# Patient Record
Sex: Female | Born: 1935 | Race: White | Hispanic: No | Smoking: Never smoker
Health system: Southern US, Community
[De-identification: ages and names within clinical notes are randomized; demographics above are authoritative.]

## PROBLEM LIST (undated history)

## (undated) DIAGNOSIS — F329 Major depressive disorder, single episode, unspecified: Secondary | ICD-10-CM

## (undated) DIAGNOSIS — K5790 Diverticulosis of intestine, part unspecified, without perforation or abscess without bleeding: Secondary | ICD-10-CM

## (undated) DIAGNOSIS — IMO0001 Reserved for inherently not codable concepts without codable children: Secondary | ICD-10-CM

## (undated) DIAGNOSIS — S62109A Fracture of unspecified carpal bone, unspecified wrist, initial encounter for closed fracture: Secondary | ICD-10-CM

## (undated) DIAGNOSIS — Z5189 Encounter for other specified aftercare: Secondary | ICD-10-CM

## (undated) DIAGNOSIS — R51 Headache: Secondary | ICD-10-CM

## (undated) DIAGNOSIS — K589 Irritable bowel syndrome without diarrhea: Secondary | ICD-10-CM

## (undated) DIAGNOSIS — I2510 Atherosclerotic heart disease of native coronary artery without angina pectoris: Secondary | ICD-10-CM

## (undated) DIAGNOSIS — K219 Gastro-esophageal reflux disease without esophagitis: Secondary | ICD-10-CM

## (undated) DIAGNOSIS — F32A Depression, unspecified: Secondary | ICD-10-CM

## (undated) DIAGNOSIS — R0602 Shortness of breath: Secondary | ICD-10-CM

## (undated) DIAGNOSIS — E785 Hyperlipidemia, unspecified: Secondary | ICD-10-CM

## (undated) DIAGNOSIS — M1710 Unilateral primary osteoarthritis, unspecified knee: Secondary | ICD-10-CM

## (undated) DIAGNOSIS — IMO0002 Reserved for concepts with insufficient information to code with codable children: Secondary | ICD-10-CM

## (undated) DIAGNOSIS — R296 Repeated falls: Secondary | ICD-10-CM

## (undated) DIAGNOSIS — D649 Anemia, unspecified: Secondary | ICD-10-CM

## (undated) DIAGNOSIS — F419 Anxiety disorder, unspecified: Secondary | ICD-10-CM

## (undated) DIAGNOSIS — I10 Essential (primary) hypertension: Secondary | ICD-10-CM

## (undated) DIAGNOSIS — I5181 Takotsubo syndrome: Secondary | ICD-10-CM

## (undated) HISTORY — PX: KNEE SURGERY: SHX244

## (undated) HISTORY — DX: Irritable bowel syndrome, unspecified: K58.9

## (undated) HISTORY — PX: BREAST RECONSTRUCTION: SHX9

## (undated) HISTORY — PX: IMPLANTABLE CONTACT LENS IMPLANTATION: SHX1792

## (undated) HISTORY — DX: Takotsubo syndrome: I51.81

## (undated) HISTORY — DX: Gastro-esophageal reflux disease without esophagitis: K21.9

## (undated) HISTORY — DX: Unilateral primary osteoarthritis, unspecified knee: M17.10

## (undated) HISTORY — PX: HYSTEROTOMY: SHX1776

## (undated) HISTORY — DX: Reserved for concepts with insufficient information to code with codable children: IMO0002

## (undated) HISTORY — PX: TONSILLECTOMY: SUR1361

## (undated) HISTORY — DX: Hyperlipidemia, unspecified: E78.5

## (undated) HISTORY — PX: CATARACT EXTRACTION W/ INTRAOCULAR LENS  IMPLANT, BILATERAL: SHX1307

## (undated) HISTORY — PX: BACK SURGERY: SHX140

## (undated) HISTORY — PX: FRACTURE SURGERY: SHX138

## (undated) HISTORY — DX: Diverticulosis of intestine, part unspecified, without perforation or abscess without bleeding: K57.90

## (undated) HISTORY — PX: JOINT REPLACEMENT: SHX530

---

## 1997-10-26 ENCOUNTER — Other Ambulatory Visit: Admission: RE | Admit: 1997-10-26 | Discharge: 1997-10-26 | Payer: Self-pay | Admitting: Obstetrics and Gynecology

## 1998-01-03 ENCOUNTER — Ambulatory Visit (HOSPITAL_COMMUNITY): Admission: RE | Admit: 1998-01-03 | Discharge: 1998-01-03 | Payer: Self-pay | Admitting: General Surgery

## 1998-05-16 ENCOUNTER — Ambulatory Visit (HOSPITAL_COMMUNITY): Admission: RE | Admit: 1998-05-16 | Discharge: 1998-05-16 | Payer: Self-pay | Admitting: Gastroenterology

## 2000-02-15 ENCOUNTER — Encounter: Payer: Self-pay | Admitting: Obstetrics and Gynecology

## 2000-02-15 ENCOUNTER — Encounter: Admission: RE | Admit: 2000-02-15 | Discharge: 2000-02-15 | Payer: Self-pay | Admitting: Obstetrics and Gynecology

## 2000-09-18 ENCOUNTER — Other Ambulatory Visit: Admission: RE | Admit: 2000-09-18 | Discharge: 2000-09-18 | Payer: Self-pay | Admitting: Obstetrics and Gynecology

## 2001-02-24 ENCOUNTER — Encounter: Admission: RE | Admit: 2001-02-24 | Discharge: 2001-02-24 | Payer: Self-pay | Admitting: Obstetrics and Gynecology

## 2001-02-24 ENCOUNTER — Encounter: Payer: Self-pay | Admitting: Obstetrics and Gynecology

## 2001-11-04 ENCOUNTER — Other Ambulatory Visit: Admission: RE | Admit: 2001-11-04 | Discharge: 2001-11-04 | Payer: Self-pay | Admitting: Internal Medicine

## 2002-06-30 ENCOUNTER — Encounter: Admission: RE | Admit: 2002-06-30 | Discharge: 2002-06-30 | Payer: Self-pay | Admitting: Gastroenterology

## 2002-06-30 ENCOUNTER — Encounter: Payer: Self-pay | Admitting: Gastroenterology

## 2002-07-13 ENCOUNTER — Encounter: Payer: Self-pay | Admitting: Obstetrics and Gynecology

## 2002-07-13 ENCOUNTER — Encounter: Admission: RE | Admit: 2002-07-13 | Discharge: 2002-07-13 | Payer: Self-pay | Admitting: Gastroenterology

## 2002-07-13 ENCOUNTER — Encounter: Payer: Self-pay | Admitting: Gastroenterology

## 2002-07-17 ENCOUNTER — Encounter: Admission: RE | Admit: 2002-07-17 | Discharge: 2002-07-17 | Payer: Self-pay | Admitting: Obstetrics and Gynecology

## 2002-07-17 ENCOUNTER — Encounter: Payer: Self-pay | Admitting: Obstetrics and Gynecology

## 2002-08-05 ENCOUNTER — Encounter: Payer: Self-pay | Admitting: Gastroenterology

## 2002-08-05 ENCOUNTER — Encounter: Admission: RE | Admit: 2002-08-05 | Discharge: 2002-08-05 | Payer: Self-pay | Admitting: Gastroenterology

## 2002-12-11 ENCOUNTER — Ambulatory Visit (HOSPITAL_COMMUNITY): Admission: RE | Admit: 2002-12-11 | Discharge: 2002-12-11 | Payer: Self-pay | Admitting: Family Medicine

## 2003-08-16 ENCOUNTER — Inpatient Hospital Stay (HOSPITAL_COMMUNITY): Admission: RE | Admit: 2003-08-16 | Discharge: 2003-08-25 | Payer: Self-pay | Admitting: Orthopedic Surgery

## 2003-09-13 ENCOUNTER — Encounter: Admission: RE | Admit: 2003-09-13 | Discharge: 2003-12-12 | Payer: Self-pay | Admitting: Orthopedic Surgery

## 2005-05-31 ENCOUNTER — Encounter: Admission: RE | Admit: 2005-05-31 | Discharge: 2005-05-31 | Payer: Self-pay | Admitting: Obstetrics and Gynecology

## 2005-08-07 ENCOUNTER — Ambulatory Visit (HOSPITAL_COMMUNITY): Admission: RE | Admit: 2005-08-07 | Discharge: 2005-08-07 | Payer: Self-pay | Admitting: Ophthalmology

## 2006-03-13 ENCOUNTER — Ambulatory Visit: Payer: Self-pay | Admitting: *Deleted

## 2006-03-13 ENCOUNTER — Inpatient Hospital Stay (HOSPITAL_COMMUNITY): Admission: EM | Admit: 2006-03-13 | Discharge: 2006-03-16 | Payer: Self-pay | Admitting: Emergency Medicine

## 2006-03-14 ENCOUNTER — Encounter: Payer: Self-pay | Admitting: Cardiology

## 2006-04-08 ENCOUNTER — Ambulatory Visit: Payer: Self-pay

## 2006-04-08 ENCOUNTER — Encounter: Payer: Self-pay | Admitting: Cardiology

## 2006-04-16 ENCOUNTER — Ambulatory Visit: Payer: Self-pay | Admitting: Cardiovascular Disease

## 2006-06-18 ENCOUNTER — Encounter: Admission: RE | Admit: 2006-06-18 | Discharge: 2006-06-18 | Payer: Self-pay | Admitting: Obstetrics and Gynecology

## 2007-06-20 ENCOUNTER — Encounter: Admission: RE | Admit: 2007-06-20 | Discharge: 2007-06-20 | Payer: Self-pay | Admitting: Obstetrics and Gynecology

## 2007-06-27 ENCOUNTER — Ambulatory Visit: Payer: Self-pay | Admitting: Cardiovascular Disease

## 2008-06-07 ENCOUNTER — Ambulatory Visit: Payer: Self-pay | Admitting: Cardiovascular Disease

## 2008-06-22 ENCOUNTER — Encounter: Admission: RE | Admit: 2008-06-22 | Discharge: 2008-06-22 | Payer: Self-pay | Admitting: Obstetrics and Gynecology

## 2008-07-21 ENCOUNTER — Encounter: Payer: Self-pay | Admitting: Cardiovascular Disease

## 2008-07-21 ENCOUNTER — Ambulatory Visit: Payer: Self-pay

## 2009-03-22 ENCOUNTER — Emergency Department (HOSPITAL_COMMUNITY): Admission: EM | Admit: 2009-03-22 | Discharge: 2009-03-22 | Payer: Self-pay | Admitting: Emergency Medicine

## 2009-05-25 DIAGNOSIS — K5730 Diverticulosis of large intestine without perforation or abscess without bleeding: Secondary | ICD-10-CM | POA: Insufficient documentation

## 2009-05-25 DIAGNOSIS — Z8719 Personal history of other diseases of the digestive system: Secondary | ICD-10-CM

## 2009-05-25 DIAGNOSIS — I5181 Takotsubo syndrome: Secondary | ICD-10-CM | POA: Insufficient documentation

## 2009-05-25 DIAGNOSIS — K219 Gastro-esophageal reflux disease without esophagitis: Secondary | ICD-10-CM

## 2009-05-25 DIAGNOSIS — M1710 Unilateral primary osteoarthritis, unspecified knee: Secondary | ICD-10-CM

## 2009-05-25 DIAGNOSIS — E785 Hyperlipidemia, unspecified: Secondary | ICD-10-CM

## 2009-10-04 ENCOUNTER — Ambulatory Visit: Payer: Self-pay | Admitting: Cardiovascular Disease

## 2010-03-14 ENCOUNTER — Encounter: Admission: RE | Admit: 2010-03-14 | Discharge: 2010-03-14 | Payer: Self-pay | Admitting: Obstetrics and Gynecology

## 2010-07-03 ENCOUNTER — Telehealth: Payer: Self-pay | Admitting: Cardiovascular Disease

## 2010-07-03 ENCOUNTER — Ambulatory Visit: Payer: Self-pay

## 2010-08-24 NOTE — Assessment & Plan Note (Signed)
Summary: f1y    Visit Type:  1 year follow up Primary Provider:  Benedetto Goad, M.D.  CC:  Chest pains relief with nitrog- Tiredness.  History of Present Illness: 76 year-old woman with history of Tako-Tsubo cardiomyopathy, presenting today for follow-up evaluation. She presented with her initial clinical event in 2007. Her LV function recovered fully, with LVEF 55-60% in 2009.   She reports extreme stress over the past year related to caring for her demented sister and difficult family dynamics associated with that.  She has had episodic chest pains and 'heart racing' at times of extreme stress, relieved with NTG. Denies dyspnea, orthopnea, PND, or edema.  Current Medications (verified): 1)  Lexapro 20 Mg Tabs (Escitalopram Oxalate) .... Take 1 Tablet By Mouth Two Times A Day 2)  Aspirin 81 Mg Tbec (Aspirin) .... Take One Tablet By Mouth Daily 3)  Oscal 500/200 D-3 500-200 Mg-Unit Tabs (Calcium-Vitamin D) .... Take 1 Tablet By Mouth Once A Day 4)  Fish Oil 1000 Mg Caps (Omega-3 Fatty Acids) .... Twice A Week 5)  Coreg 6.25 Mg Tabs (Carvedilol) .... Take 1 Tablet By Mouth Two Times A Day 6)  Omeprazole 20 Mg Tbec (Omeprazole) .... Take 1 Tablet By Mouth Once A Day 7)  Alprazolam 0.5 Mg Tabs (Alprazolam) .... As Needed 8)  Nitrostat 0.4 Mg Subl (Nitroglycerin) .Marland Kitchen.. 1 Tablet Under Tongue At Onset of Chest Pain; You May Repeat Every 5 Minutes For Up To 3 Doses. 9)  Amoxicillin 500 Mg Caps (Amoxicillin) .... Prior Dentist Appointment  Allergies: 1)  ! Septra  Past History:  Past medical history reviewed for relevance to current acute and chronic problems.  Past Medical History: Reviewed history from 05/25/2009 and no changes required. 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 HEENT: normal Neck: normal carotid upstrokes without bruits, JVP normal Lungs: CTA CV: RRR without murmur or gallop Abd: soft, NT, positive BS, no bruit, no organomegaly Ext: no clubbing, cyanosis, or edema. 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 She had nonobstructive CAD by cardiac catheterization. Echo from about one year ago was back to normal with LVEF 60%. Normal EKG is reassuring as well. I would continue her current medical therapy without changes. We had a long talk about stress reduction, daily walks, and taking some time for herself.  Problem # 2:  HYPERLIPIDEMIA (ICD-272.4) followed by PCP.  Patient Instructions: 1)  Your physician recommends that you continue on your current medications as directed. Please refer to the Current Medication list given  to you today. 2)  Your physician wants you to follow-up in:   1 YEAR. You will receive a reminder letter in the mail two months in advance. If you don't receive a letter, please call our office to schedule the follow-up appointment.

## 2010-08-24 NOTE — Progress Notes (Signed)
Summary: heart rushing with sob off and on  Phone Note Call from Patient Call back at [facility] Phone [phone]   Caller: Patient Summary of Call: Pt heart rushing ,sob Initial call taken by: Judie Grieve,  July 03, 2010 11:38 AM  Follow-up for Phone Call        I spoke with the pt and she c/o SOB, diaphoresis and her heart racing with activity.  The pt said she has had to hire someone to finish her decorations and clean the house.  These symptoms have been occuring for a couple of months and do not occur on a daily basis.   The pt has had some pain in her chest but she cannot tell if it is heart related or gas.  The pt does take NTG for these symptoms and it has helped on some occasions.  I have arranged an appt with Tereso Newcomer PA-C today at 2:30.    Follow-up by: Julieta Gutting, RN, BSN,  July 03, 2010 11:58 AM

## 2010-09-30 ENCOUNTER — Encounter: Payer: Self-pay | Admitting: Cardiovascular Disease

## 2010-10-16 ENCOUNTER — Ambulatory Visit (INDEPENDENT_AMBULATORY_CARE_PROVIDER_SITE_OTHER): Payer: Medicare Other | Admitting: Cardiovascular Disease

## 2010-10-16 ENCOUNTER — Encounter: Payer: Self-pay | Admitting: Cardiovascular Disease

## 2010-10-16 VITALS — BP 120/68 | HR 55 | Ht 63.0 in | Wt 124.0 lb

## 2010-10-16 DIAGNOSIS — I5181 Takotsubo syndrome: Secondary | ICD-10-CM

## 2010-10-16 DIAGNOSIS — R072 Precordial pain: Secondary | ICD-10-CM

## 2010-10-16 DIAGNOSIS — I2510 Atherosclerotic heart disease of native coronary artery without angina pectoris: Secondary | ICD-10-CM

## 2010-10-16 NOTE — Assessment & Plan Note (Addendum)
The patient has underlying nonobstructive coronary disease from a cardiac catheterization several years ago. She reports increasing chest pain and shortness of breath. Recommend evaluation with a Lexiscan Myoview stress test.  If her stress test is normal, I would suspect her symptoms are related to stress and anxiety. She will continue her current medical program which includes a beta blocker and aspirin.

## 2010-10-16 NOTE — Progress Notes (Signed)
HPI:  76 year-old woman presenting for followup evaluation of Takotsubo's cardiomyopathy. She continues to have episodic chest pain and complains of problems with stress.  The patient has been under a great deal of stress with family illness lately. She has had several family members diagnosed with cancer. She has become increasingly concerned that she may have cancer as well because of secondhand smoke exposure. She has upcoming followup with her primary care physician.  The patient reports chest pressure with emotional stress. She denies exertional chest pain or pressure. She also complains of dyspnea with exertion. She denies edema, lightheadedness, palpitations, or syncope.    Outpatient Encounter Prescriptions as of 10/16/2010  Medication Sig Dispense Refill  . ALPRAZolam (XANAX) 0.5 MG tablet Take 0.5 mg by mouth at bedtime as needed.        Marland Kitchen amoxicillin (AMOXIL) 500 MG tablet Prn dental       . aspirin 81 MG tablet Take 81 mg by mouth daily.        . calcium-vitamin D (OSCAL WITH D 500-200) 500-200 MG-UNIT per tablet Take 1 tablet by mouth daily.        . carvedilol (COREG) 6.25 MG tablet Take 6.25 mg by mouth 2 (two) times daily with a meal.        . escitalopram (LEXAPRO) 20 MG tablet Take 20 mg by mouth 2 (two) times daily.        . nitroGLYCERIN (NITROSTAT) 0.4 MG SL tablet Place 0.4 mg under the tongue every 5 (five) minutes as needed.        . Omega-3 Fatty Acids (FISH OIL CONCENTRATE PO) Twice a week       . omeprazole (PRILOSEC) 20 MG capsule Take 20 mg by mouth daily.          Allergies  Allergen Reactions  . Sulfamethoxazole W/Trimethoprim     Past Medical History  Diagnosis Date  . Takotsubo syndrome   . Other and unspecified hyperlipidemia   . Esophageal reflux   . Osteoarthrosis, unspecified whether generalized or localized, lower leg   . IBS (irritable bowel syndrome)   . Diverticular disease     ROS:  Positive for gait instability and poor balance, otherwise  negative except as per HPI  BP 120/68  Pulse 55  Ht 5\' 3"  (1.6 m)  Wt 124 lb (56.246 kg)  BMI 21.97 kg/m2  PHYSICAL EXAM: Pt is alert and oriented, elderly woman in NAD HEENT: normal Neck: JVP - normal, carotids 2+= without bruits Lungs: CTA bilaterally CV: bradycardic and regular without murmur or gallop Abd: soft, NT, Positive BS, no hepatomegaly Ext: no C/C/E, distal pulses intact and equal Skin: warm/dry no rash  EKG:  Sinus bradycardia 55 bpm, otherwise within normal limits.  ASSESSMENT AND PLAN:

## 2010-10-16 NOTE — Patient Instructions (Signed)
Your physician has requested that you have a lexiscan myoview. For further information please visit [email or web address]. Please follow instruction sheet, as given.  Your physician recommends that you continue on your current medications as directed. Please refer to the Current Medication list given to you today.  Your physician wants you to follow-up in: 1 YEAR.  You will receive a reminder letter in the mail two months in advance. If you don't receive a letter, please call our office to schedule the follow-up appointment.   

## 2010-10-24 ENCOUNTER — Ambulatory Visit (HOSPITAL_COMMUNITY): Payer: Medicare Other | Attending: Cardiovascular Disease | Admitting: Radiology

## 2010-10-24 DIAGNOSIS — R0609 Other forms of dyspnea: Secondary | ICD-10-CM

## 2010-10-24 DIAGNOSIS — R079 Chest pain, unspecified: Secondary | ICD-10-CM

## 2010-10-24 DIAGNOSIS — I2510 Atherosclerotic heart disease of native coronary artery without angina pectoris: Secondary | ICD-10-CM

## 2010-10-24 MED ORDER — TECHNETIUM TC 99M TETROFOSMIN IV KIT
11.0000 | PACK | Freq: Once | INTRAVENOUS | Status: AC | PRN
  Administered 2010-10-24: 11 via INTRAVENOUS

## 2010-10-24 MED ORDER — TECHNETIUM TC 99M TETROFOSMIN IV KIT
33.0000 | PACK | Freq: Once | INTRAVENOUS | Status: AC | PRN
  Administered 2010-10-24: 33 via INTRAVENOUS

## 2010-10-24 MED ORDER — REGADENOSON 0.4 MG/5ML IV SOLN
0.4000 mg | Freq: Once | INTRAVENOUS | Status: AC
  Administered 2010-10-24: 0.4 mg via INTRAVENOUS

## 2010-10-24 NOTE — Progress Notes (Signed)
[facility] SITE 3 NUCLEAR MED [address] [place] [address] [phone]  Cardiology Nuclear Med Study  [name] is a 76 y.o. female [id number] Jan 19, 1935   Nuclear Med Background Indication for Stress Test:  Evaluation for Ischemia History: '07 Heart Catheterization: N/O CAD;'09 Echo: EF=60%;Takotsubo's cardiomyopathy Cardiac Risk Factors: Family History - CAD and Lipids  Symptoms:  Chest Pain with Exertion (last date of chest discomfort last yesterday), Dizziness, DOE, Fatigue, Fatigue with Exertion, Light-Headedness, Near Syncope, Palpitations, Rapid HR and SOB   Nuclear Pre-Procedure Caffeine/Decaff Intake:  None NPO After: 10:00pm   Lungs:  Clear IV 0.9% NS with Angio Cath:  20g  IV Site: R Antecubital  IV Started by:  Stanton Kidney, EMT-P  Chest Size (in):  36 Cup Size: C  Height: 5\' 3"  (1.6 m)  Weight:  121 lb (54.885 kg)  BMI:  Body mass index is 21.43 kg/(m^2). Tech Comments:  Beta blocker held this am, per patient.    Nuclear Med Study 1 or 2 day study: 1 day  Stress Test Type:  Lexiscan  Reading MD: Cassell Clement, MD  Order Authorizing Provider:  Dr. Tonny Bollman, MD  Resting Radionuclide: Technetium 2m Tetrofosmin  Resting Radionuclide Dose: 11 mCi   Stress Radionuclide:  Technetium 64m Tetrofosmin  Stress Radionuclide Dose: 33 mCi           Stress Protocol Rest HR: 57 Stress HR: 87  Rest BP: 124/75 Stress BP: 140/79  Exercise Time (min): n/a METS: n/a   Predicted Max HR: 147 bpm % Max HR: 59.18 bpm Rate Pressure Product: 21308   Dose of Adenosine (mg):  n/a Dose of Lexiscan: 0.4 mg  Dose of Atropine (mg): n/a Dose of Dobutamine: n/a mcg/kg/min (at max HR)  Stress Test Technologist: Irean Hong, RN  Nuclear Technologist:  Domenic Polite, CNMT     Rest Procedure:  Myocardial perfusion imaging was performed at rest 45 minutes following the intravenous administration of Technetium 46m Tetrofosmin. Rest ECG: NSR with  early replorization  Stress Procedure:  The patient received IV Lexiscan 0.4 mg over 15-seconds.  Technetium 31m Tetrofosmin injected at 30-seconds.  There were nonspecific T wave changes with Lexiscan. The patient complained of chest tightness with Lexiscan.  Quantitative spect images were obtained after a 45 minute delay. Stress ECG: No significant change from baseline ECG  QPS Raw Data Images:  Normal; no motion artifact; normal heart/lung ratio. Stress Images:  Normal homogeneous uptake in all areas of the myocardium. Rest Images:  Normal homogeneous uptake in all areas of the myocardium. Subtraction (SDS):  No evidence of ischemia. Transient Ischemic Dilatation (Normal <1.22):  0.88 Lung/Heart Ratio (Normal <0.45):  0.31  Quantitative Gated Spect Images QGS EDV:  43 ml QGS ESV:  8 ml QGS cine images:  NL LV Function; NL Wall Motion QGS EF: 82%  Impression Exercise Capacity:  Lexiscan with no exercise. BP Response:  Normal blood pressure response. Clinical Symptoms:  No chest pain. ECG Impression:  No significant ST segment change suggestive of ischemia. Comparison with Prior Nuclear Study: No previous nuclear study performed  Overall Impression:  Normal stress nuclear study. and With stress there is no chest pain and no significant EKG change.    Cassell Clement

## 2010-10-25 NOTE — Progress Notes (Signed)
Normal nuclear study noted. 

## 2010-10-25 NOTE — Progress Notes (Signed)
ROUTED TO DR. COOPER.Falecha Clark ° °

## 2010-10-26 ENCOUNTER — Telehealth: Payer: Self-pay | Admitting: Cardiovascular Disease

## 2010-10-26 NOTE — Telephone Encounter (Signed)
I spoke with the pt and she requested a copy of myoview be mailed to her home. Myoview placed in the out going mail.

## 2010-10-26 NOTE — Progress Notes (Signed)
Pt aware of results by phone.  

## 2010-10-26 NOTE — Telephone Encounter (Signed)
Pt states she talk to a nurse re test results. Pt states she does not remember the results. Pt would like the results mail to her.

## 2010-12-05 NOTE — Assessment & Plan Note (Signed)
[facility]                            CARDIOLOGY OFFICE NOTE   CAMILLE, DRAGAN                         MRN:          [id number]  DATE:06/07/2008                            DOB:          02/18/1935    [name] was seen in followup at the [facility] Cardiology Office,  June 07, 2008.  She is a delightful 76 year old woman with a history  of Tako-tsubo cardiomyopathy.  She had typical findings of apical  ballooning and severe LV dysfunction at presentation with an EF of 35-  40%.  Her posthospitalization echo showed normalization of LV function  with an EF of 60%.  From a symptomatic standpoint, she has had  intermittent chest pains and exertional dyspnea for several months.  She  relates her chest pain due to stress level.  She also has dyspnea with  moderate level of activity.  She described an incident where her cat got  outside and she had to chase the cat down.  She had extreme dyspnea with  this activity and fell very poorly thereafter.  She denies orthopnea,  PND, edema, or palpitations.   MEDICATIONS:  1. Lexapro 20 mg twice daily.  2. Dicyclomine 10 mg 4 daily.  3. Aspirin 81 mg daily.  4. Multivitamin 1 daily.  5. Os-Cal plus D 600 mg daily.  6. Beta-carotene 1 daily.  7. Omega-3 fish oil twice weekly.  8. Coreg 6.25 mg twice daily.  9. Omeprazole 20 mg daily.   ALLERGIES:  SEPTRA.   PHYSICAL EXAMINATION:  GENERAL:  The patient is alert and oriented.  No  acute distress.  VITAL SIGNS:  Weight is 128 pounds, blood pressure 112/70, heart rate  68, respiratory rate 12.  HEENT:  Normal.  NECK:  Normal carotid upstrokes.  No bruits.  JVP normal.  LUNGS:  Clear bilaterally.  HEART:  Regular rate and rhythm.  No murmurs or gallops.  ABDOMEN:  Soft, nontender.  No organomegaly.  EXTREMITIES:  No clubbing, cyanosis, or edema.   EKG shows normal sinus rhythm and is within normal limits.   ASSESSMENT:  Tako-tsubo cardiomyopathy.   Continue Coreg.  Repeat 2-D  echocardiogram to reassess LV function in the setting of her dyspnea.  I  suspect her chest pain is stress related.  No other changes were made in  her medical regimen today.  I spoke with her at length about  starting an exercise program to help her deal with all of her stress.  I  will see her back in 1 year for followup and will review her echo after  it is completed.     Veverly Fells. Excell Seltzer, MD  Electronically Signed    MDC/MedQ  DD: 06/07/2008  DT: 06/08/2008  Job #: [id number]

## 2010-12-05 NOTE — Assessment & Plan Note (Signed)
[facility]                            CARDIOLOGY OFFICE NOTE   WYONA, NEILS                         MRN:          [id number]  DATE:06/27/2007                            DOB:          11-19-1934    [name] was seen in followup at the [facility] on June 27, 2007.  [name] is a delightful 76 year old woman with a history of  July 02, 2007 Tako-Tsubo cardiomyopathy.  She presented with typical  to the hospital in August 2007, with symptoms typical of an acute  coronary syndrome and had positive cardiac biomarkers.  She underwent  catheterization that demonstrated nonobstructive coronary artery disease  and typical findings of Tako-Tsubo cardiomyopathy.  She has been treated  with medical therapy and has done well.  Her post hospital  echocardiogram demonstrated normalization of her left ventricular  function with an EF of 60%.   From a symptomatic standpoint [name] has done relatively well.  She  describes occasional left sided chest pain that occur with stress.  She  has been through a lot of family stress with the death of a family  member recently.  She denies exertional chest discomfort.  She has no  orthopnea, PND, palpitations, or edema.   CURRENT MEDICATIONS:  1. Carvedilol 3.125 mg twice daily.  2. Lexapro 20 mg twice daily.  3. Dicyclomine 10 mg four daily.  4. Aspirin 81 mg daily.  5. Multivitamin daily.  6. Os-Cal plus D 600 mg daily.  7. Betacarotene daily.  8. Omega-3 fish oil twice weekly.   ALLERGIES:  SEPTRA.   PHYSICAL EXAMINATION:  GENERAL: The patient is alert and oriented.  She  is in no acute distress.  VITAL SIGNS:  Weight is 131, blood pressure 106/80, heart rate 70,  respiratory rate 16.  HEENT:  Normal.  NECK:  Normal carotid upstrokes without bruits.  Jugular venous pressure  is normal.  LUNGS:  Clear to auscultation bilaterally.  HEART:  Regular rate and rhythm without murmurs or  gallops.  ABDOMEN:  Soft, nontender.  No organomegaly.  EXTREMITIES:  No clubbing, cyanosis, or edema.   EKG shows normal sinus rhythm and is within normal limits.  Heart rate  is 70 beats per minute.   ASSESSMENT:  [name] is stable from a cardiac standpoint.  I think  with her recurrent chest pain, it would be reasonable to increase her  beta-blocker dose.  I have asked her to double her Coreg to 6.25 mg  twice daily.  The patients with Tako-Tsubo cardiomyopathy often times  have recurrence of similar chest pains.  The best way to manage this  will be to continue to titrate her beta-blocker accordingly.  Otherwise,  [name] is doing quite well.  I have asked her to remain on low dose  aspirin for her nonobstructive coronary artery disease.     Veverly Fells. Excell Seltzer, MD  Electronically Signed    MDC/MedQ  DD: 07/02/2007  DT: 07/02/2007  Job #: [id number]   cc:   Gloriajean Dell. Andrey Campanile, M.D.

## 2010-12-08 NOTE — Consult Note (Signed)
NAME:  [name], [name]                            ACCOUNT NO.:  [id number]   MEDICAL RECORD NO.:  [id number]                   PATIENT TYPE:  INP   LOCATION:  5024                                 FACILITY:  [facility]   PHYSICIAN:  Corinna L. Lendell Caprice, MD             DATE OF BIRTH:  Feb 18, 1935   DATE OF CONSULTATION:  08/19/2003  DATE OF DISCHARGE:                                   CONSULTATION   CONSULTING PHYSICIAN:  Dr. Carlisle Beers. Rendall.   REASON FOR CONSULTATION:  Right upper lobe infiltrate.   IMPRESSIONS/RECOMMENDATIONS:  1. Right upper lobe pneumonia, possibly aspiration related, given her recent     general anesthesia and nausea.  I will give Avelox and add clindamycin     for good anaerobic coverage.  She will need a repeat chest x-ray     eventually and once she is able to tolerate diet better, can be switched     to oral antibiotics.  2. Hypoxia secondary to #1.  Agree with supplemental oxygen.  Currently, her     oxygen saturations are good on 2 L nasal cannula.  3. Confusion, possibly secondary to pneumonia and hypoxia versus medication     related.  4. Status post right total knee arthroplasty.  The patient is on deep venous     thrombosis prophylaxis.  5. Gastroesophageal reflux disease.  I will add Protonix, given all of her     nausea and as she is on low-molecular-weight heparin for ulcer     prophylaxis; also, it may help with her nausea.  6. Irritable bowel syndrome.   HISTORY OF PRESENT ILLNESS:  [name] is a 76 year old white female who  underwent a right total knee arthroplasty on the 24th of January.  Since  then, she has apparently been having a lot of problems with pain and nausea.  She had a negative PVL of the legs because she had some calf pain.  Currently, she is confused and therefore, most of the history is per old  records and per the nurses.  I was called by the physician's assistant  tonight at 8 o'clock to consult for a right upper lobe pneumonia  and  hypoxia.  Her PO2 on room air was only 48.6 and currently, her oxygen  saturation is good on 2 L nasal cannula.   PAST MEDICAL HISTORY:  Past medical history as above.   MEDICATIONS:  1. Celexa 40 mg p.o. daily.  2. Colace.  3. Senokot.  4. Bentyl 10 mg p.o. 4 times daily.  5. Arixtra 2.5 mg per 24 hours.  6. She is getting p.r.n. Percocet and Reglan, p.r.n. Skelaxin, p.r.n.     Restoril, p.r.n. Phenergan.   SOCIAL HISTORY:  The patient does not drink or smoke.   FAMILY HISTORY/REVIEW OF SYSTEMS:  Family history/review of systems unable  to obtain as the patient is rather confused.   PHYSICAL  EXAMINATION:  VITAL SIGNS:  On physical examination, her  temperature is 99.3; her maximum temperature over the past 48 hours was  100.6.  Her pulse is 93, respiratory rate 18, blood pressure 101/46.  She is  93% on 2 L nasal cannula.  GENERAL:  In general, the patient is asleep but arousable.  She is oriented  to place and time but the answers to my questions are nonsensical and she  quickly falls back asleep.  HEENT:  Normocephalic, atraumatic.  Pupils equal, round and reactive to  light.  Moist mucous membranes.  Oropharynx is clear.  NECK:  Neck is supple.  No lymphadenopathy.  No carotid bruits or  thyromegaly.  No jugular venous distention.  LUNGS:  Lungs are clear to auscultation but poor inspiratory effort, no  wheezes, rhonchi or rales.  CARDIOVASCULAR:  Regular rate and rhythm without murmurs, gallops or rubs.  ABDOMEN:  Normal bowel sounds, soft, nontender and nondistended.  GU AND RECTAL:  Deferred.  EXTREMITIES:  She has sequential compression devices and TED hose.  No  clubbing, cyanosis, or edema.  No calf tenderness.   LABORATORIES:  Her white blood cell count today was 15, up from 13 yesterday  and 10 on the 25th; her hemoglobin is 9.3, hematocrit is 26.8, platelet  count 178,000.  ABG on room air:  Her pH was 7.506, PCO2 37, PO2 48.   Chest x-ray -- wet reading  called to the nurse -- shows right upper lobe  infiltrate and bibasilar atelectasis versus infiltrate.   COMMENT:  I would like to thank Dr. Priscille Kluver for this consult and I will  continue to follow as needed.                                               Corinna L. Lendell Caprice, MD    CLS/MEDQ  D:  08/19/2003  T:  08/21/2003  Job:  [id number]

## 2010-12-08 NOTE — Discharge Summary (Signed)
[name], [name]                  ACCOUNT NO.:  [id number]   MEDICAL RECORD NO.:  [id number]          PATIENT TYPE:  INP   LOCATION:  2916                         FACILITY:  [facility]   PHYSICIAN:  Gene Serpe, PA-C       DATE OF BIRTH:  1935-07-06   DATE OF ADMISSION:  03/13/2006  DATE OF DISCHARGE:  03/16/2006                                 DISCHARGE SUMMARY   PRIMARY CARDIOLOGIST:  Dr. Tonny Bollman (new)   PRINCIPAL DIAGNOSES:  1. Status post hypokalemia.  2. Takotsubo cardiomyopathy.      a.     Non ST elevation myocardial infarction (peak troponin I 0.7).      b.     Moderate left anterior descending disease with intramyocardial       bridge.      c.     Normal circumflex/right coronary arteries.      d.     EF 35-40% by echocardiography.  3. Status post hypotension.  4. Hyperlipidemia.  5. Question viral upper respiratory infection.  6. Abnormal chest x-ray.      a.     Negative followup chest CT scan.   PROCEDURES:  Emergent coronary angiography, August 22, by Dr. Tonny Bollman  (see Cath report for full details).   REASON FOR ADMISSION:  [name] is a 76 year old female, with no known  cardiac history, who presented to the Emergency Room with worsening  epigastric discomfort associated with dyspnea.  She was found to have  abnormal cardiac markers, as well as new T-wave inversions and  inferior/anterior ST elevation on electrocardiogram.  She was, thus, taken  directly to the Catheterization Lab for emergent coronary angiography.   HOSPITAL COURSE:  Coronary angiography was performed by Dr. Tonny Bollman  (see Cath report for full details), revealing moderate, mid left anterior  descending artery disease with intramyocardial bridging, otherwise normal  circumflex and right coronary arteries.  LV-gram revealed severe segmental  LV dysfunction with apical ballooning pattern.   Followup echocardiography suggested and ejection fraction of 35-40% with  apical  dyskinesia.  A repeat echocardiogram in six to eight week is  recommended.   Dr. Excell Seltzer concluded that this is suggestive of Takotsubo cardiomyopathy and  recommended continued treatment with Coreg.  ACE inhibitor was discontinued  secondary to relative hypotension.   Patient was cleared for discharge on hospital day three, in hemodynamically  stable condition.  Her blood pressure had improved to the 120 systolic  range.   At time of discharge, we discussed prior treatment with cholesterol-lowering  agents.  LDL this admission is 136.  The patient, however, is reluctant to  take any additional medications, but agreed to discuss this with her  physician at time of scheduled followup.   LABORATORY DATA:  Negative total CPK with elevated MB of 7.7; peak troponin  I 0.73 on admission.  Lipid profile:  Total cholesterol 170, triglyceride  69, HDL 20 and LDL 136.  Sodium 130, potassium 3.1, BUN 3, creatinine 0.8 on  admission.  Decreased albumin 2.8, otherwise normal liver enzymes.  WBC 10,  hemoglobin 10.8, hematocrit 31.7 (MCV 92), platelets 259 on admission, INR  1.4 on admission.   Admission Chest X-Ray:  Right lung base rounded opacity.  Followup Chest CT  Scan:  Prominent epicardial fat pad; no mass/acute abnormality.   DISCHARGE MEDICATIONS:  1. Coreg 3.125 mg b.i.d.  2. Aspirin 325 mg daily.  3. Nitrostat 0.4 mg as directed.  4. Xanax 0.25 mg q.h.s.  5. Lexapro 20 mg daily.  6. Bentyl 10 mg q.i.d.   FOLLOWUP:  1. Dr. Tonny Bollman on September 25 at 9:30 a.m.  2. Follow up 2D echocardiogram scheduled for September 17 at 11:30 a.m.   DISPOSITION:  Stable.           ______________________________  Rozell Searing, PA-C     GS/MEDQ  D:  03/16/2006  T:  03/16/2006  Job:  [id number]

## 2010-12-08 NOTE — H&P (Signed)
NAMENICOLETTA, HUSH                  ACCOUNT NO.:  [id number]   MEDICAL RECORD NO.:  [id number]          PATIENT TYPE:  INP   LOCATION:  2916                         FACILITY:  [facility]   PHYSICIAN:  Jonelle Sidle, MD DATE OF BIRTH:  09/29/1934   DATE OF ADMISSION:  03/13/2006  DATE OF DISCHARGE:                                HISTORY & PHYSICAL   CHIEF COMPLAINT:  Chest pain.   HISTORY OF PRESENT ILLNESS:  Ms. Tafolla is a 76 year old woman with a past  medical history which is devoid of any cardiovascular risk factors, who for  the last week and a half has been complaining of epigastric burning, which  became worse today while working in her yard.  It was associated with  shortness of breath and a sense of impending doom.   PAST MEDICAL HISTORY:  1. Depression.  2. Hysterectomy.  3. Left knee surgery.   SOCIAL HISTORY:  Lives locally with her husband.  While she herself does not  smoke, she reports she had substantial exposure to secondhand smoke during  her adult life.   FAMILY HISTORY:  GI bleeding.   REVIEW OF SYSTEMS:  Otherwise negative.   ALLERGIES:  No known drug allergies.   MEDICATIONS:  Lexapro, Xanax, Protonix.   PHYSICAL EXAMINATION:  VITAL SIGNS:  Temperature 98.7, pulse 87, respiratory  rate 14, blood pressure 120/70.  Saturation 98% on room air.  GENERAL:  She is alert and oriented x3.  HEENT:  She is normocephalic, atraumatic.  Pupils are equal, round and  reactive to light.  Extraocular motion is intact.  ABDOMEN:  Soft and nontender with normal bowel sounds.  NECK:  Supple.  Jugular venous pulsation is appropriate.  No  lymphadenopathy.  CARDIOVASCULAR:  Regular S1 and S2 with no murmurs, rubs, or gallops.  LUNGS:  Clear to auscultation.  RECTAL:  Manson Passey, heme-negative stool.  EXTREMITIES:  No clubbing, cyanosis, or edema.  NEUROLOGIC:  She is alert and oriented x3 with cranial nerves II-XII intact.   LABORATORY AND ACCESSORY CLINICAL DATA:  Chest  x-ray shows no infiltrate or  pulmonary edema.   EKG:  The patient's EKG shows new T wave inversions with less than 2-mm ST  segment elevations in the inferior and anterior leads.   Point-of-care labs show a troponin of 0.3.  Remaining labs are pending.   ASSESSMENT AND PLAN:  This is a 76 year old female who has chest pain.  She  has EKG abnormalities that are not typical of an ST-segment-elevation  myocardial infarction; however, with her point-of-care enzymes being  positive and the interval change in her EKG since January, compounded by the  fact that the patient has not become chest-pain-free since starting  nitroglycerin, we will proceed to activate the cardiac catheterization  laboratory for further evaluation of the patient's coronary anatomy.  We  will proceed based on these findings.      Daniel B. Haithcock, MD   Electronically Signed     ______________________________  Jonelle Sidle, MD    DBH/MEDQ  D:  03/14/2006  T:  03/14/2006  Job:  [id number] 

## 2010-12-08 NOTE — Op Note (Signed)
[name], [name]                  ACCOUNT NO.:  [id number]   MEDICAL RECORD NO.:  [id number]          PATIENT TYPE:  AMB   LOCATION:  SDS                          FACILITY:  [facility]   PHYSICIAN:  Jillyn Hidden A. Rankin, M.D.   DATE OF BIRTH:  Jan 30, 1935   DATE OF PROCEDURE:  08/07/2005  DATE OF DISCHARGE:                                 OPERATIVE REPORT   PREOPERATIVE DIAGNOSES:  Peel of membrane, OD.   POSTOPERATIVE DIAGNOSIS:  1.  Peel of membrane, OD.  2.  Retinal hole temporally OD as well as small posterior hole from the      membrane peel, OD.   SURGEON:  Alford Highland. Rankin, M.D.   ANESTHESIA:  Local retrobulbar anesthesia.   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  She understands the risks of anesthesia to the oropharynx as well as the  risks to the eye including but not limited to infection, scarring, need for  further surgery, no change in vision, loss of vision or progression of  disease despite intervention.  After appropriate signed consent was  obtained, she was taken to the operating room.  In the operating room,  appropriate monitoring was followed by mild sedation.  Marcaine 0.75% total  of 5 mL retrobulbar with an additional 5 mL laterally fashioned modified van  lamp.  The right periocular region was thoroughly prepped and draped in the  usual ophthalmic fashion.  Lid speculum applied.  A 25 gauge vitrectomy  trocar system was then used.  The infusion cannula placed in the  inferotemporal quadrant.  Placement in the vitreous cavity verified  visually.  Superior trocar was applied.  Core vitrectomy was then begun.  Modified en bloc technique was then used to remove the posterior hyaloid  from the optic nerve with active suction as well as carrying out the  dissection anterior to the equator at 360 degrees.  Shiny epiretinal  membrane which appeared to be mostly  internal membrane was removed in a  continuous circular fashion off the macular region.  Typical, faint  whitening of the retina occurred signifying the removal of the internal  limbal membrane.  No complications occurred initially.  However, 4.5-5 disk  diameters from the fovea, a small area of hole formation appeared to occur.  Endolaser photocoagulation was placed in this area.  During the vitrectomy,  peripheral skirt dissection temporally and what appeared to be an old  operculated atrophic hole was found and this was also treated with endolaser  photocoagulation in focal fashion.   At this time, surgical instruments were removed from the eye.  Peripheral  retina was inspected and found to be free of other holes or retinal tears.  Under high effusion brushes, the __________ was removed and the intraocular  pressure was maintained.  Subconjunctival Decadron was applied.  Soft  __________ fox shield applied to the right eye.  The patient was taken to  the recovery area and discharged home as an outpatient.  Alford Highland Rankin, M.D.  Electronically Signed    GAR/MEDQ  D:  08/07/2005  T:  08/07/2005  Job:  [id number]

## 2010-12-08 NOTE — Discharge Summary (Signed)
NAMEANAHIS, [name]                            ACCOUNT NO.:  [id number]   MEDICAL RECORD NO.:  [id number]                   PATIENT TYPE:  INP   LOCATION:  5024                                 FACILITY:  [facility]   PHYSICIAN:  John L. Rendall, M.D.               DATE OF BIRTH:  April 23, 1935   DATE OF ADMISSION:  08/16/2003  DATE OF DISCHARGE:  08/25/2003                                 DISCHARGE SUMMARY   ADMITTING DIAGNOSES:  1. End-stage osteoarthritis right knee.  2. Acute chest pain possibly due to gastroesophageal reflux disease.  3. Gastroesophageal reflux disease.  4. Diverticular disease.  5. Irritable bowel syndrome.  6. History of anemia.  7. History of multiple urinary tract infections with hematuria.   DISCHARGE DIAGNOSES:  1. Status post right total knee arthroplasty.  2. Acute blood loss anemia secondary to surgery.  3. Hypoxia resolved.  4. Confusion and sedation secondary to pain medicine and hypoxia.  5. Gastroesophageal reflux disease.  6. Irritable bowel syndrome.  7. Pneumonia right upper lobe on antibiotics.  8. Diarrhea unresolved.  Patient has history of diverticular disease.  9. Hypokalemia resolved.   HISTORY OF PRESENT ILLNESS:  [name] is a 76 year old white female with  right knee pain who is gradually getting worse over the past 5 years.  Patient has history of right knee cyst removal done in 1990.  Patient  recently underwent a right knee arthroscopy on December 30, 2002 for lateral and  medial meniscus tears and removal of loose bodies.  She now describes her  pain as constant, sharp, shooting pain with occasional burning sensation.  She does have some radiation of the burning sensation down into the heel of  the right foot.  Mechanical symptoms positive for giving way and locking.  She has difficulty walking long distances due to her knee pain and giving  way sensation.  She has trouble getting up from a sitting position.  For  pain she takes Tylenol  with fair relief.  Vicodin is taken by the patient so  she can sleep at night.  She uses assistive devices to walk.  Right total  knee is to be done at [facility] on August 16, 2003.   ALLERGIES:  SEPTRA causes rash.   MEDICATIONS:  1. Dyclonine 10 mg p.o. __________ .  2. Celexa 40 mg p.o. every morning.  3. Hydrocodone 5 mg/500 q.4h. p.r.n. pain.   SURGICAL PROCEDURE:  Patient was taken to the operating room by Dr. Erasmo Leventhal assisted by Arnoldo Morale, P.A.-C. on August 16, 2003.  Patient was  placed under general anesthesia and received a femoral nerve block after  having a right LCS total knee replacement performed.  The following  components were placed - 2.5 tibial tray, 12.5 spacer, and a standard  femoral component.  The patient tolerated the procedure well and returns to  recovery in  good stable condition.   CONSULTATIONS:  The following consults were obtained while patient was  hospitalized PT, OT, medicine [facility]), rehab, and case  management.   HOSPITAL COURSE:  The patient developed acute blood loss anemia secondary to  surgery and required a total of 3 units of packed red blood cells.  Patient  developed confusion and sedation secondary to pain medications and was  ultimately placed on Ultracet for pain control.   Patient developed hypoxia secondary to right upper lobe pneumonia that was  due to possible aspiration.  Patient was placed on antibiotics for the right  upper lobe pneumonia.  Hypoxia resolved.  Patient developed hypokalemia and  was treated for this at time of discharge.  Potassium was within normal  limits.  Patient also developed diarrhea, Clostridium difficile was checked,  this was negative.  Diarrhea was felt to be due to her diverticular disease.  Also during hospital course patient developed a urinary tract infection with  Enterococcus species which was treated with Augmentin.   ROUTINE LABORATORIES ON ADMISSION:  CBC:   White blood cell count 6.6,  hemoglobin 12.4, hematocrit 37.7, platelets of 347.   Coagulopathies on admission:  PT 14.1, INR 1.1, PTT 29.   Routine chemistries on admission:  Sodium 137, potassium 3.5, chloride 99,  bicarb 27, glucose 110, BUN 7, creatinine 0.8.   Blood gas drawn on August 19, 2003 FiO2 0.21, pH 7.506, pCO2 37.3, pO2 48,  bicarb 29.8, pCO2 of 30.4 base excess 5.9, O2 saturation percent 87.4.   Urinalysis on August 19, 2003 - 15 mg/dl of ketones, otherwise negative.  Urine culture was performed on the urine from August 19, 2003 and showed  Enterococcus species 1000 col/ml.  Stool cultures C. difficile done on  August 22, 2003 was negative.  Lower extremity venous Doppler showed no  evidence of DVT, superficial thrombosis, or Baker's cyst bilaterally on  August 18, 2003.  Chest x-ray dated August 19, 2003 showed minimal right  upper lobe infiltrate.  Bilateral basilar infiltrates versus atelectasis.  Chest CT performed August 20, 2003 with contrast showed consolidation in  the right upper and lower lobes without significant volume loss, typical of  pneumonia.  No evidence of pulmonary embolism.   DISCHARGE INSTRUCTIONS:  Patient was to resume home meds except for Vicodin  while on Ultracet.  Patient was to have to the following meds:  Ultracet  37.5/325 one to two tablets every 6 hours as needed for pain, Augmentin 500  mg/125 mg one tablet by mouth twice a day for 8 days, guaifenesin 600 mg one  tablet two times a day for 9 days, magic mouth wash 5-10 mL p.o. q.6h. x14  days.   ACTIVITY:  Weightbearing as tolerated with walker.  Home health PT [facility].   DIET:  No restrictions.   WOUND CARE:  Patient is to keep wound clean and dry.  The patient is to call  office any signs of infection develop.   FOLLOW UP:  Patient needs followup with Dr. Priscille Kluver in the office August 31, 2003.  Patient is to call our office at [phone] for appointment. Patient also  needs followup with primary care physician 2-3 days after  discharge for loose stool and to check potassium.  Potassium on August 25, 2003 was 3.8.   SPECIAL INSTRUCTIONS:  Patient is to begin CPM machine 0-90 degrees 6-8  hours per day increased by 10 degrees daily.      Richardean Canal, P.A.  John L. Priscille Kluver, M.D.    Leanora Cover  D:  09/24/2003  T:  09/25/2003  Job:  [id number]   cc:   Gloriajean Dell. Andrey Campanile, M.D.  [address]  [place]  [place] [address]  Fax: [phone]

## 2010-12-08 NOTE — H&P (Signed)
NAME:  [name], [name]                            ACCOUNT NO.:  [id number]   MEDICAL RECORD NO.:  [id number]                   PATIENT TYPE:  INP   LOCATION:  NA                                   FACILITY:  [facility]   PHYSICIAN:  John L. Rendall, M.D.               DATE OF BIRTH:  1935/04/24   DATE OF ADMISSION:  08/16/2003  DATE OF DISCHARGE:                                HISTORY & PHYSICAL   CHIEF COMPLAINT:  Right knee pain.   HISTORY OF PRESENT ILLNESS:  The patient is a 76 year old white female with  right knee pain that has been getting gradually worse over the past five  years.  The patient has a past history of right knee cyst removal done in  1970.  She recently underwent a right knee arthroscopy on December 30, 2002, for  lateral and medial meniscal tears and removal of loose bodies.  The  patient's right knee pain now is a constant pain described as sharp shooting  pain with an occasional burning sensation.  She does have some radiation of  the burning sensation down into the heel of the right foot.  Mechanical  symptoms include giving way and locking.  She was originally seen in May of  2004 when she fell and tripped at home and hit her head on the tile.  She  denies any recent falls, however, does have knee give way quite often.  She  is unable to walk long distance due to her knee pain and the giving way  sensation.  She has trouble getting up from a sitting position.  She takes  Tylenol for pain with fair pain relief.  Vicodin is taken by the patient one  q.night so she can sleep through the night.  She uses assistive devices.  Right total knee is to be done at [facility] by Jonny Ruiz L. Rendall,  M.D. on August 16, 2003.   ALLERGIES:  SEPTRA causes rash.   PAST MEDICAL HISTORY:  Irritable bowel syndrome, end-stage osteoarthritis  right knee, GERD, history of anemia, diverticular disease, history of  frequent UTI's.   PAST SURGICAL HISTORY:  1. Tonsillectomy.  2.  Removal of nodules from right breast.  3. Removal of growth from behind her left thigh.  4. Cyst removed from the lateral aspect of the right knee in 1970.  5. Breast implants bilaterally in 1985.  6. Hysterectomy in 1982.  7. Right knee arthroscopy in 2004 by John L. Rendall, M.D.   SOCIAL HISTORY:  The patient denies any tobacco or alcohol use.  She is  married.  Her primary care physician is Gloriajean Dell. Andrey Campanile, M.D. in [place],  [place]. His phone number is [phone].  The patient lives in a one-  story home with one step to the usual entrance.  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   FAMILY HISTORY:  Mother deceased at age 36 due to CHF and had a gastric  ulcer and required multiple transfusions.  Father deceased at age 44 due to  aortic aneurysm and had lung disease due to smoking.  He passed away on the  surgical table.  The patient has a total of nine brothers and sisters.  Her  siblings' medical history is positive for heart disease, diabetes mellitus,  and lung disease due to smoking.   REVIEW OF SYSTEMS:  The patient denies any recent cold, cough, fever, or flu-  like symptoms.  She denies any recent orthopnea or PND.  She has had some  recent chest pain, mild, however this past weekend while singing, she had a  very severe chest pain in the midsternum area.  She denies any shortness of  breath with this chest pain.  Chest pain does not radiate down either arm.  She does have shortness of breath with exertion and feels this is due to  deconditioning. She states that she tires easily.  She has reflux, irritable  bowel syndrome, and therefore, has bouts of diarrhea.  She has a history of  frequent UTI's which included some hematuria. Medical history is also  positive for a history of anemia.  Has some neck pain and right hand  tingling at times.  Positive for right hip pain.  Otherwise, review of  systems is negative.   PHYSICAL  EXAMINATION:  GENERAL:  The patient is a well-developed, slightly  estenic female, who walks with a slight limp on the right side.  The  patient's mood and affect are appropriate.  The patient talks easily with  examiner.  VITAL SIGNS:  Height 5 foot 5 inches, weight 125 pounds.  Temperature 97.7,  pulse 70, blood pressure 100/68, respiratory rate 16.  HEART:  Regular rate and rhythm with no murmurs, rubs, or gallops noted.  LUNGS:  Clear to auscultation bilaterally.  No wheezing, rhonchi, or rales  heard to auscultation.  ABDOMEN:  Soft, bowel sounds x4 quadrants.  She does have epigastric, right  upper quadrant, and left upper quadrant pain with no rebound tenderness.  BACK:  Nontender to palpation of the thoracic and lumbar spine.  NECK:  Full flexion and extension.  However, she does have limited rotation  of the neck to the right. There is no pain behind the cervical spinous  processes.  She does have some paraspinous tenderness on the right side at  the level of C7.  HEENT:  Normocephalic and atraumatic without maxillary or sinus tenderness  to palpation.  Conjunctivae are pink bilaterally.  Sclerae nonicteric  bilaterally.  PERRLA.  EOM's intact.  No visible external ear deformities  are noted.  TM's are pearly and gray bilaterally.  Nose and nasal septum are  midline.  Nasal mucosa is pink and moist without polyps.  Buccal mucosa is  pink and moist.  Good dental repair.  Pharynx is without erythema or  exudate.  Tongue and uvula are midline.  BREASTS: GENITOURINARY: RECTAL:  All deferred at this time.  NEUROLOGY:  Cranial nerves II-XII grossly intact.  The patient is alert and  oriented x3.  Resistant testing of upper and lower extremity shows strength  to be 5/5 throughout.  Deep tendon reflexes are 2+ bilaterally in the upper  and lower extremities.  MUSCULOSKELETAL:  The patient has full range of motion of the shoulders, elbows, wrists, and digits bilaterally.  Otherwise the  upper extremities  are  neuro and motor intact.  Canals over both wrists are negative for tingling  sensation.  The patient has good grip strength bilaterally.  Radial pulses  are 2+ bilaterally.  Lower extremities have full range of motion of both  hips.  Left knee full range of motion.  No valgus and varus stressing.  McMurray's is negative bilaterally.  The patient is nontender along the  joint line.  Right knee positive for crepitus. No tenderness along the joint  line.  No edema negative.  McMurray's is negative.  Valgus stressing is  positive pain in the medial compartment.  Lower extremities; no pitting  edema is noted in the lower extremities.  EHL and DHL intact bilaterally.  The patient has bilateral hammertoes.  Dorsal pedal pulses 2+ bilaterally.   X-rays of the right knee reveals osteoarthritis.   IMPRESSION:  1. End-stage osteoarthritis, failed knee arthroscopy.  2. Acute chest pain possibly due to gastroesophageal reflux disease.  3. Gastroesophageal reflux disease.  4. Diverticular disease.  5. Irritable bowel syndrome.  6. History of anemia.  7. History of multiple urinary tract infections with hematuria.   PLAN:  Due to the patient's recent chest pain, will have her follow up at  her primary care doctor.  Will also follow up _________.  She has an  appointment with Evelena Peat, M.D. on August 06, 2003, for workup of  her chest pain prior to surgery.  Dr. Lucie Leather office to fax over  surgical clearance prior to the patient undergoing a right total knee  arthroplasty on July 27, 2003, by Dr. Priscille Kluver.  Otherwise the patient is  to undergo all preoperative labs and testing prior to surgery.      Richardean Canal, P.A.                       John L. Priscille Kluver, M.D.    GC/MEDQ  D:  08/05/2003  T:  08/05/2003  Job:  [id number]

## 2010-12-08 NOTE — Cardiovascular Report (Signed)
[name], [name]                  ACCOUNT NO.:  [id number]   MEDICAL RECORD NO.:  [id number]          PATIENT TYPE:  INP   LOCATION:  2916                         FACILITY:  [facility]   PHYSICIAN:  Veverly Fells. Excell Seltzer, MD  DATE OF BIRTH:  [birth year]   DATE OF PROCEDURE:  03/13/2006  DATE OF DISCHARGE:                              CARDIAC CATHETERIZATION   INDICATION:  [name] is a very pleasant 76 year old woman who has been  experiencing substernal chest pressure over the past several days now with  crescendo angina and resting chest pain.  Her EKG demonstrated both inferior  and anterior subtle ST elevation with ischemic-appearing T wave changes.  She had an initial troponin that was positive and in the setting of her  ongoing pain, she was brought emergently to the cath lab for a cardiac  catheterization.   PROCEDURE:  1. Left heart catheterization.  2. Selective coronary angiogram.  3. Left ventricular angiogram.   PROCEDURE DESCRIPTION:  The indications and risks of the procedure were  explained to the patient, who fully understood.  Informed consent was  obtained.  The right groin was prepped and draped in normal sterile fashion.  The patient was premedicated with 1 mg of Versed.  The right groin was  anesthetized with 1% lidocaine.  Arterial access was gained using the  modified Seldinger technique and a 6-French arterial sheath was placed.  The  catheters used included a 6-French JL4, JR4 and angled pigtail catheter.  All catheter exchanges were performed over a J-tipped guidewire.   FINDINGS:  Aortic pressure was 98/61 with a mean of 78.  Left ventricular  pressure was 108 with a left ventricular end-diastolic pressure of 14.   CORONARY ANGIOGRAPHY:  Left main stem was normal; it branched into an LAD  and left circumflex.  The LAD demonstrated nonobstructive plaque in the  proximal segment.  There is a large first diagonal branch that bifurcates  into twin vessels.  It  had a 20% ostial stenosis.  In the mid LAD, there was  a segment of intramyocardial bridging that appeared to have associated  disease in the range of 50% stenosis.  Intracoronary nitroglycerin was given  to further evaluate the segment and there was persistent bridging, but the  stenosis appeared to be more in the range of 30% after intracoronary  nitroglycerin.  The distal LAD was free of any angiographic disease.   The left circumflex gave off 1 significant obtuse marginal as well as a left  posterolateral branch.  The remainder of the circumflex coursed through the  AV groove and there was no angiographic disease identified in the left  circumflex system.   The right coronary artery was dominant.  It gave off a small RV marginal  branch and a large posterior descending artery.  There was no significant  angiographic disease in the right coronary artery.   LEFT VENTRICULOGRAPHY:  The left ventriculogram was performed in the 30-  degree RAO projection.  It demonstrated dyskinetic anterolateral apical and  inferoapical segments. The basal anterior and basal inferior segments were  hyperdynamic.  The left ventriculogram has the classic appearance for apical  ballooning syndrome.   ASSESSMENT:  1. Severe segmental left ventricular dysfunction consistent with apical      ballooning pattern.  2. Moderate mid left anterior descending coronary artery disease with a      prominent intramyocardial bridge.  3. Normal left circumflex artery.  4. Normal right coronary artery.   DISCUSSION:  While the patient did have moderate disease associated with a  bridging segment in the mid LAD, she has TIMI-3 flow in this vessel and this  stenosis would not explain her dramatic left ventricular dysfunction seen on  the left ventriculogram.  The fact that she is a postmenopausal woman under  a high degree of stress with mildly elevated cardiac biomarkers as well as  somewhat atypical EKG changes for an  acute MI, all in the classic setting of  her left ventriculogram, point more towards transient LV apical ballooning  syndrome or Takotsubo's cardiomyopathy.  I will continue to treat her with  medical therapy, but will discontinue all anticoagulation.  She will be  treated with beta blocker, ACE inhibitor and nitrates as needed.  We will  watch her in the ICU overnight and if stable, transfer out tomorrow to a  telemetry floor.      Veverly Fells. Excell Seltzer, MD  Electronically Signed     MDC/MEDQ  D:  03/14/2006  T:  03/14/2006  Job:  [id number]

## 2010-12-08 NOTE — Letter (Signed)
April 16, 2006     [name]. Andrey Campanile, M.D.  [address]  [place],  [place] [address]   RE:  IOLA, TURRI  MRN:  [id number]  /  DOB:  [birth year]   Dear Dr. Andrey Campanile:   It was my pleasure to see [name] in followup after a recent  hospitalization at [facility].  [name] is a very pleasant 76 year old  woman who presents after a recent hospitalization for what was thought to be  a myocardial infarction.  However, at the time of cardiac catheterization  she was found to have the classic findings of Takotsubo's cardiomyopathy.   [name] presented with chest pain, EKG changes and elevated bio markers.  Because of that, she was taken emergently for a cardiac catheterization, and  this demonstrated nonobstructive coronary artery disease, with a left  ventriculogram appearance demonstrating left ventricular apical ballooning.  This presentation and finding on her left ventriculogram was classic for  Takotsubo's cardiomyopathy, which is a stress-related acute cardiomyopathy  that typically occurs in postmenopausal women who have an anxious demeanor  and undergo a major stressor.  Fortunately, it is often characterized by  recovery of ventricular function and a good long-term prognosis.   Since hospital discharge, [name] has done very well.  She has occasional  chest pain, but she has been very active and has not had any major problems.  [place] and [place].  She has not had any exertional  symptoms.  She does have difficulty tolerating the hot temperatures in the  summer.  She denies orthopnea, PND, lightheadedness, syncope or other  cardiovascular complaints.   CURRENT MEDICATIONS:  1. Coreg 3.125 mg b.i.d.  2. Lexapro 20 mg daily.  3. Glycopyrrolate 2 mg daily.  4. Xanax 0.5 mg one-half to one nightly.  5. Aspirin 325 mg daily.  6. Multivitamin daily.  7. Vitamin B daily.  8. Calcium 300 mg daily.  9. Bentyl 10 mg two to three times daily.   ALLERGIES:  She is allergic to [facility].   PHYSICAL EXAMINATION:  She is alert and oriented, in no acute distress.  Her weight is 128 pounds, blood pressure is 122/74, heart rate is 56,  respirations are 12.  NECK:  Normal carotid upstrokes without bruits.  Jugular venous pressure is  normal.  LUNGS:  Clear to auscultation bilaterally.  CARDIOVASCULAR:  The apex is discrete and nondisplaced.  The heart is  regular rate and rhythm without murmurs or gallops.  ABDOMEN:  Soft, nontender, no organomegaly, no abdominal bruits.  EXTREMITIES:  No clubbing, cyanosis or edema.  Peripheral pulses are 2+ and  equal throughout.   An EKG performed in the office today showed sinus bradycardia with  anterolateral T wave changes that are consistent with ischemia.  Her EKG is  not significantly changed from her hospitalization.   A post hospital echocardiogram, performed on September 17th, demonstrated  normalization of her left ventricular function.  Her left ventricular  ejection fraction has improved from 35% up to 60%.  Her wall thickness was  normal as well.  There was no significant valvular disease present.   ASSESSMENT:  [name] is currently stable from a cardiovascular  standpoint.  Her cardiovascular problems are as follows:  1. Takotsubo's cardiomyopathy.  As predicted, she has had normalization of      her left ventricular function.  I have recommended that she continue on      carvedilol.  I wrote her a  prescription today for the generic Coreg CR      10 mg twice daily.  She should continue on this lifelong, as beta      blocker therapy theoretically may help with preventing future      recurrences, and has already likely helped in recovery of her left      ventricular function.  I would like her to be on an angiotensin-      converting enzyme inhibitor as well, but she did not tolerate even low-      dose angiotensin-converting enzyme inhibitor therapy in the hospital,      and since  she has had full recovery of her left ventricular function in      the absence of an angiotensin-converting enzyme inhibitor, I think this      is unnecessary at this time.  2. Coronary artery disease.  She did have an incidental finding of mild      coronary artery disease in her mid left anterior descending associated      with a bridging segment in this vessel.  This did not account for her      symptoms, but still warrants medical therapy.  I recommended that she      continue on aspirin lifelong.  I told her it was okay to reduce the      aspirin dose from 325 mg to 81 mg daily.  Her lipid panel showed a      total cholesterol of 170, with an HDL of 20 and an LDL of 136.  I have      discussed treating her with statin therapy, and recommended that today.      However, the patient does not like taking medications and really is not      interested in taking a statin at this point.  I advised her that she      could have her lipids redrawn when she sees you in followup, and you      could have further discussion with her regarding the benefits of statin      therapy in the presence of coronary artery disease at that time.   I would like to see [name] back in 6 months for routine followup.  If  she develops any problems in the interim, please feel free to contact me at  any time.    Sincerely,      Veverly Fells. Excell Seltzer, MD   MDC/MedQ  DD:  04/16/2006  DT:  04/18/2006  Job #:  [id number]

## 2010-12-08 NOTE — Op Note (Signed)
NAMESALA, TAGUE                            ACCOUNT NO.:  [id number]   MEDICAL RECORD NO.:  [id number]                   PATIENT TYPE:  INP   LOCATION:  2899                                 FACILITY:  [facility]   PHYSICIAN:  John L. Rendall III, M.D.           DATE OF BIRTH:  10-Jun-1935   DATE OF PROCEDURE:  08/16/2003  DATE OF DISCHARGE:                                 OPERATIVE REPORT   PREOPERATIVE DIAGNOSIS:  Osteoarthritis, right knee.   SURGICAL PROCEDURE:  Right LCS total knee.   POSTOPERATIVE DIAGNOSIS:  Osteoarthritis, right knee.   SURGEON:  John L. Rendall, M.D.   ASSISTANT:  Legrand Pitts. Duffy, PAC.   ANESTHESIA:  General with femoral nerve block after.   PATHOLOGY:  The patient has unremitting pain with osteoarthritis, right  knee, with inflammation along all articular surfaces and thinning and  degeneration of all articular surfaces.   PROCEDURE:  Under general anesthesia, the right leg is prepared with  DuraPrep and draped as a sterile field.  A sterile tourniquet is applied.  The leg is wrapped out with the Esmarch, and the tourniquet is used to 350  mmHg.  Midline incision is made.  The femur is sized at a standard.  Using  the first tibial guide, a proximal tibial resection is carried out.  Using  the first femoral guide, an intercondylar drill hole is placed.  Using the  second femoral guide, the anterior and posterior flare of the distal femur  are resected with a 10 mm flexion gap.  The intramedullary guide is then  used and distal femoral cut is made with a 10 mm extension gap.  Recessing  guide is then used.  Following this, the lamina spreader is inserted, and  remnants of the cruciates and menisci are trimmed and debris is taken off  the back of the femoral condyles and posterior recesses.  At this point, the  proximal tibia is sized at a 2.5.  It is noted that there is too much  corticol bone still present on the proximal tibia, and decision is made to  cut the tibia 2 mm lower.  This is then done, and tibia is still sized to  2.5.  A 12.5 spacer is then used with the standard femur.  Trial reduction  of all components reveals excellent fit, total alignment and stability.  Conformant components are then obtained while bony surfaces are prepared  with pulse irrigation.  They are all cemented in place.  The excess cement  is removed after hardening.  Tourniquet is let down under 50 minutes.  Multiple small vessels are cauterized.  The knee is then closed in layers  with #1 Tycron, 0 and 2-0 Vicryl and skin clips.  Operative time a little  over 1 hour.  Blood loss 150 mL.  John L. Dorothyann Gibbs, M.D.    Renato Gails  D:  08/16/2003  T:  08/16/2003  Job:  [id number]

## 2011-01-09 ENCOUNTER — Other Ambulatory Visit: Payer: Self-pay | Admitting: Family Medicine

## 2011-01-09 ENCOUNTER — Other Ambulatory Visit (HOSPITAL_COMMUNITY): Payer: Self-pay | Admitting: Family Medicine

## 2011-01-09 DIAGNOSIS — M545 Low back pain: Secondary | ICD-10-CM

## 2011-01-09 DIAGNOSIS — R29898 Other symptoms and signs involving the musculoskeletal system: Secondary | ICD-10-CM

## 2011-01-10 ENCOUNTER — Ambulatory Visit (HOSPITAL_COMMUNITY)
Admission: RE | Admit: 2011-01-10 | Discharge: 2011-01-10 | Disposition: A | Discharge status: Home / Self Care | Payer: Medicare Other | Source: Ambulatory Visit | Attending: Family Medicine | Admitting: Family Medicine

## 2011-01-10 DIAGNOSIS — M545 Low back pain, unspecified: Secondary | ICD-10-CM | POA: Insufficient documentation

## 2011-01-10 DIAGNOSIS — M5126 Other intervertebral disc displacement, lumbar region: Secondary | ICD-10-CM | POA: Insufficient documentation

## 2011-01-10 DIAGNOSIS — M25559 Pain in unspecified hip: Secondary | ICD-10-CM | POA: Insufficient documentation

## 2011-01-10 DIAGNOSIS — M5146 Schmorl's nodes, lumbar region: Secondary | ICD-10-CM | POA: Insufficient documentation

## 2011-01-13 ENCOUNTER — Other Ambulatory Visit: Payer: BC Managed Care – PPO

## 2011-02-14 ENCOUNTER — Inpatient Hospital Stay (HOSPITAL_COMMUNITY)
Admission: EM | Admit: 2011-02-14 | Discharge: 2011-02-17 | DRG: 552 | Disposition: A | Discharge status: Home Health | Payer: Medicare Other | Attending: Family Medicine | Admitting: Family Medicine

## 2011-02-14 ENCOUNTER — Inpatient Hospital Stay (HOSPITAL_COMMUNITY): Payer: Medicare Other

## 2011-02-14 ENCOUNTER — Other Ambulatory Visit (HOSPITAL_COMMUNITY): Payer: Medicare Other

## 2011-02-14 DIAGNOSIS — Z7982 Long term (current) use of aspirin: Secondary | ICD-10-CM

## 2011-02-14 DIAGNOSIS — M545 Low back pain: Secondary | ICD-10-CM

## 2011-02-14 DIAGNOSIS — R29898 Other symptoms and signs involving the musculoskeletal system: Secondary | ICD-10-CM

## 2011-02-14 DIAGNOSIS — M51379 Other intervertebral disc degeneration, lumbosacral region without mention of lumbar back pain or lower extremity pain: Secondary | ICD-10-CM | POA: Diagnosis present

## 2011-02-14 DIAGNOSIS — R55 Syncope and collapse: Secondary | ICD-10-CM | POA: Diagnosis present

## 2011-02-14 DIAGNOSIS — Z96659 Presence of unspecified artificial knee joint: Secondary | ICD-10-CM

## 2011-02-14 DIAGNOSIS — M47817 Spondylosis without myelopathy or radiculopathy, lumbosacral region: Principal | ICD-10-CM | POA: Diagnosis present

## 2011-02-14 DIAGNOSIS — E860 Dehydration: Secondary | ICD-10-CM | POA: Diagnosis present

## 2011-02-14 DIAGNOSIS — I252 Old myocardial infarction: Secondary | ICD-10-CM

## 2011-02-14 DIAGNOSIS — M5137 Other intervertebral disc degeneration, lumbosacral region: Secondary | ICD-10-CM | POA: Diagnosis present

## 2011-02-14 LAB — URINALYSIS, ROUTINE W REFLEX MICROSCOPIC
Glucose, UA: NEGATIVE mg/dL
Hgb urine dipstick: NEGATIVE
Leukocytes, UA: NEGATIVE
Specific Gravity, Urine: 1.014 (ref 1.005–1.030)
pH: 6.5 (ref 5.0–8.0)

## 2011-02-14 LAB — CBC
HCT: 33.4 % — ABNORMAL LOW (ref 36.0–46.0)
MCH: 30.6 pg (ref 26.0–34.0)
MCV: 89.5 fL (ref 78.0–100.0)
Platelets: 256 10*3/uL (ref 150–400)
RBC: 3.73 MIL/uL — ABNORMAL LOW (ref 3.87–5.11)
RDW: 12.3 % (ref 11.5–15.5)

## 2011-02-14 LAB — DIFFERENTIAL
Eosinophils Absolute: 0.3 10*3/uL (ref 0.0–0.7)
Eosinophils Relative: 3 % (ref 0–5)
Lymphocytes Relative: 16 % (ref 12–46)
Lymphs Abs: 1.4 10*3/uL (ref 0.7–4.0)
Monocytes Relative: 8 % (ref 3–12)

## 2011-02-14 LAB — BASIC METABOLIC PANEL
BUN: 10 mg/dL (ref 6–23)
CO2: 30 mEq/L (ref 19–32)
Calcium: 9.1 mg/dL (ref 8.4–10.5)
Chloride: 100 mEq/L (ref 96–112)
Creatinine, Ser: 0.61 mg/dL (ref 0.50–1.10)

## 2011-02-14 LAB — CK TOTAL AND CKMB (NOT AT ARMC)
CK, MB: 3.3 ng/mL (ref 0.3–4.0)
Total CK: 61 U/L (ref 7–177)

## 2011-02-14 LAB — TROPONIN I: Troponin I: <0.3 ng/mL (ref <0.30)

## 2011-02-15 ENCOUNTER — Other Ambulatory Visit (HOSPITAL_COMMUNITY): Payer: Medicare Other

## 2011-02-15 ENCOUNTER — Inpatient Hospital Stay (HOSPITAL_COMMUNITY): Payer: Medicare Other

## 2011-02-15 DIAGNOSIS — I369 Nonrheumatic tricuspid valve disorder, unspecified: Secondary | ICD-10-CM

## 2011-02-15 LAB — CBC
HCT: 29.4 % — ABNORMAL LOW (ref 36.0–46.0)
Hemoglobin: 10.1 g/dL — ABNORMAL LOW (ref 12.0–15.0)
MCH: 30.8 pg (ref 26.0–34.0)
MCHC: 34.4 g/dL (ref 30.0–36.0)
MCV: 89.6 fL (ref 78.0–100.0)
Platelets: 250 10*3/uL (ref 150–400)
RBC: 3.28 MIL/uL — ABNORMAL LOW (ref 3.87–5.11)
RDW: 12.2 % (ref 11.5–15.5)
WBC: 5.8 10*3/uL (ref 4.0–10.5)

## 2011-02-15 LAB — LIPID PANEL
Cholesterol: 167 mg/dL (ref 0–200)
HDL: 59 mg/dL (ref >39)
LDL Cholesterol: 92 mg/dL (ref 0–99)
Total CHOL/HDL Ratio: 2.8 RATIO
Triglycerides: 82 mg/dL (ref <150)
VLDL: 16 mg/dL (ref 0–40)

## 2011-02-15 LAB — CARDIAC PANEL(CRET KIN+CKTOT+MB+TROPI)
CK, MB: 4.8 ng/mL — ABNORMAL HIGH (ref 0.3–4.0)
CK, MB: 4.5 ng/mL — ABNORMAL HIGH (ref 0.3–4.0)
Relative Index: 4.5 — ABNORMAL HIGH (ref 0.0–2.5)
Relative Index: 4.5 — ABNORMAL HIGH (ref 0.0–2.5)
Total CK: 106 U/L (ref 7–177)
Total CK: 101 U/L (ref 7–177)
Troponin I: <0.3 ng/mL (ref <0.30)
Troponin I: <0.3 ng/mL (ref <0.30)

## 2011-02-15 LAB — BASIC METABOLIC PANEL
BUN: 5 mg/dL — ABNORMAL LOW (ref 6–23)
CO2: 27 mEq/L (ref 19–32)
Glucose, Bld: 93 mg/dL (ref 70–99)
Potassium: 3.4 mEq/L — ABNORMAL LOW (ref 3.5–5.1)
Sodium: 136 mEq/L (ref 135–145)

## 2011-02-15 LAB — PROTIME-INR
INR: 1.11 (ref 0.00–1.49)
Prothrombin Time: 14.5 seconds (ref 11.6–15.2)

## 2011-02-16 ENCOUNTER — Inpatient Hospital Stay (HOSPITAL_COMMUNITY): Payer: Medicare Other

## 2011-02-16 LAB — MAGNESIUM: Magnesium: 1.9 mg/dL (ref 1.5–2.5)

## 2011-02-16 LAB — CBC
HCT: 27.8 % — ABNORMAL LOW (ref 36.0–46.0)
Hemoglobin: 9.5 g/dL — ABNORMAL LOW (ref 12.0–15.0)
MCH: 30.8 pg (ref 26.0–34.0)
MCHC: 34.2 g/dL (ref 30.0–36.0)
MCV: 90.3 fL (ref 78.0–100.0)
RDW: 12.3 % (ref 11.5–15.5)

## 2011-02-16 LAB — DIFFERENTIAL
Eosinophils Relative: 6 % — ABNORMAL HIGH (ref 0–5)
Lymphocytes Relative: 52 % — ABNORMAL HIGH (ref 12–46)
Monocytes Absolute: 0.5 10*3/uL (ref 0.1–1.0)
Monocytes Relative: 10 % (ref 3–12)
Neutro Abs: 1.6 10*3/uL — ABNORMAL LOW (ref 1.7–7.7)

## 2011-02-16 LAB — COMPREHENSIVE METABOLIC PANEL
Alkaline Phosphatase: 70 U/L (ref 39–117)
BUN: 7 mg/dL (ref 6–23)
Calcium: 8.5 mg/dL (ref 8.4–10.5)
Creatinine, Ser: 0.56 mg/dL (ref 0.50–1.10)
GFR calc Af Amer: >60 mL/min (ref >60)
Glucose, Bld: 98 mg/dL (ref 70–99)
Total Protein: 5.6 g/dL — ABNORMAL LOW (ref 6.0–8.3)

## 2011-02-17 LAB — BASIC METABOLIC PANEL
Chloride: 101 mEq/L (ref 96–112)
Creatinine, Ser: 0.59 mg/dL (ref 0.50–1.10)
GFR calc Af Amer: >60 mL/min (ref >60)
Sodium: 137 mEq/L (ref 135–145)

## 2011-02-20 NOTE — H&P (Signed)
[name], [name]                  ACCOUNT NO.:  [id number]  MEDICAL RECORD NO.:  [id number]  LOCATION:  [facility]                         FACILITY:  [facility]  PHYSICIAN:  Desiree Ranger, MD       DATE OF BIRTH:  04/25/1935  DATE OF ADMISSION:  02/14/2011 DATE OF DISCHARGE:                             HISTORY & PHYSICAL   PRIMARY CARE PHYSICIAN:  Desiree Dell. Andrey Campanile, MD  CARDIOLOGIST:  Desiree Fells. Excell Seltzer, MD  NEUROSURGEON:  Desiree Loron, MD  CHIEF COMPLAINT:  Acute on chronic back pain with near syncopal episode today.  HISTORY OF PRESENT ILLNESS:  [name] is a 76 year old female with chronic back pain due to spinal stenosis, history of coronary disease, IBS, hyperlipidemia, history of Takotsubo cardiomyopathy presented to the [facility] Emergency Room with chief complaint of acute on chronic back pain.  History was provided by the patient herself.  She states that she has chronic back pain about 7-8 out of 10 in intensity due to spinal stenosis.  However, since yesterday the pain has worsened and has been 10 out of 10 in intensity.  The patient states that the pain is acutely worse today and since the morning, she has the urinary retention as well.  She denies any numbness or tingling in her lower extremities, but feels that the pain is shooting towards both legs with some weakness in legs today.  She states that she gets steroid shots in her hip every 3 months by her primary care physician.  She follows [name] from the Neurosurgery for her spinal stenosis.  She denies any falls or any trauma to the back recently.  She states that she had office visit with [name] about 2 weeks ago.  She is also under a significant stress due to her husband's recent hospitalization.  This morning, she also complained of some dizziness and lightheadedness while walking. She has a chronic gait instability due to spinal stenosis.  She states that she did not pass out and denied any  prodromal symptoms, any chest pain or shortness of breath.  She denies any fevers or chills, any palpitations, any nausea, vomiting, or abdominal pain.  She denies any diarrhea, constipation, any hematochezia or melena.  REVIEW OF SYSTEMS:  Pertinent positives are dictated above.  PAST MEDICAL HISTORY: 1. History of Takotsubo cardiomyopathy. 2. History of depression. 3. History of diverticulitis. 4. IBS. 5. Chronic back pain due to spinal stenosis.  PAST SURGICAL HISTORY: 1. Hysterectomy. 2. Knee replacement surgery. 3. Lens implantation. 4. Breast reconstruction surgery.  SOCIAL HISTORY:  The patient denies any smoking, alcohol, or any drug use.  She ambulates with a walker and currently lives at home.  ALLERGIES:  Sulfa drugs and Septra.  MEDICATIONS:  Prior to admission, currently med reconciliation is unavailable.  PHYSICAL EXAMINATION:  VITAL SIGNS:  Blood pressure 103/57, pulse rate 63, respirations 20, and temp 97.3. GENERAL:  The patient is alert, awake, and oriented x3 in mild distress due to pain. HEENT:  Anicteric sclerae.  Pink conjunctivae.  Pupils are reactive to light and accommodation.  EOMI.  Dry mucosal membranes.  Lips chapped. NECK:  Supple.  No  lymphopathy.  No JVD. CVS:  S1, S2 clear. CHEST:  Clear to auscultation bilaterally. ABDOMEN:  Nontender, nondistended, normal bowel sounds. BACK:  L3-L4, L4-L5, S5-S1 area tender and appears uncomfortable. EXTREMITIES:  No cyanosis, clubbing, or edema noted, 4/5 in bilateral lower extremities and 5/5 in upper extremities bilaterally.  DIAGNOSTIC DATA:  UA negative for any UTI.  B-MET unremarkable.  Sodium 136, potassium 3.7, BUN 10, and creatinine 0.6.  CBC; white count 8.7, hemoglobin 11.4, hematocrit 33.4, and platelet count 256.  RADIOLOGICAL DATA:  None available at this time.  However, the patient had a recent MRI of the lumbar spine done on January 11, 2011, which had shown L4-L5, multifactorial mild to  moderate spinal stenosis, and bilateral lateral recess stenosis, L3-L4 mild to slightly moderate left side and mild right side spinal stenosis.  Schmorl's node with mild superior endplate compression deformity of the T12 vertebrae with 15% loss of height.  IMPRESSION AND PLAN:  [name] is a 76 year old female with chronic back pain due to spinal stenosis, history of takotsubo cardiomyopathy presents with acute on chronic back pain. 1. Acute on chronic back pain.  The patient already has history of     spinal stenosis.  However, given new neurological symptoms of     weakness in the legs with pain radiating down to the legs and     urinary retention, we will admit the patient and obtain stat MRI of     the lumbosacral spine.  I have consulted with the neurosurgery and     discussed with Dr. Donalee Mckay who recommended a stat MRI to rule out     any nerve root compression or diskitis or herniation/cauda equina     syndrome.  I will start the patient on pain control for now further     or management after the MRI and neurosurgery evaluation. 2. Urinary retention.  Urinary tract infection has been ruled out     possibly related to the spinal stenosis if the patient has nerve     root compression or cauda equina syndrome.  We will follow stat     MRI. 3. Near syncope, possibly vasovagal with some dehydration.  The     patient appears to be clinically hydrated.  We will continue the IV     fluids.  Obtain echocardiogram for further workup.  Last     echocardiogram was in 2009 and systolic function was normal at 55%     to 60%.  If needed, her cardiologist Dr. Tonny Bollman can be     consulted. 4. History of coronary artery disease.  As dictated above, we will     obtain the 2-D echocardiogram and cardiac enzymes for further     workup. 5. Prophylaxis.  Heparin subcu for DVT prophylaxis.     Desiree Ranger, MD     RR/MEDQ  D:  02/14/2011  T:  02/14/2011  Job:  [id number]  cc:    [name], M.D. [name], M.D. Desiree Fells. Excell Seltzer, MD [name], M.D.  Electronically Signed by Andres Labrum RAI  on 02/20/2011 01:50:31 PM

## 2011-02-23 NOTE — Discharge Summary (Signed)
[name], [name]                  ACCOUNT NO.:  [id number]  MEDICAL RECORD NO.:  [id number]  LOCATION:  4715                         FACILITY:  [facility]  PHYSICIAN:  Tarry Kos, MD       DATE OF BIRTH:  Dec 12, 1934  DATE OF ADMISSION:  02/14/2011 DATE OF DISCHARGE:                              DISCHARGE SUMMARY   DISCHARGE DIAGNOSES: 1. Acute on chronic back pain. 2. Syncopal event likely secondary to uncontrolled back pain. 3. Lumbar spinal stenosis, being followed by Dr. Lovell Sheehan.  ASSESSMENT/PLAN:  [name] is a 76 year old female who has chronic lumbar stenosis who has been following Dr. Lovell Sheehan, who came in because of an acute exacerbation of her back pain and a near syncopal event, likely secondary to the uncontrolled back pain.  She only takes Tylenol and Aleve at home for her back pain.  She is on nothing chronically as far as anything stronger than over-the-counter medications.  She had an MRI of her back done which showed stable moderate impingement of L3-L4, L4-L5 due to spondylosis and degenerative disk disease.  It also showed a new L3 compression fracture of 35%.  Neurosurgery was consulted and they recommended a lumbar corset to be fitted and placed.  [name] had no focal neurologic deficits.  She did not have any lower extremity weakness or any urinary incontinence at the time of discharge.  The hospitalist yesterday try to call the Neurosurgery throughout the day yesterday and got no response.  I tried calling several times today also paging, however, this Saturday to try to clarify the discharge plan, however, again I was not able to get ahold of the neurosurgeon on-call. Due to the fact that [name] has not had any neurological symptoms from her chronic back issues, I am going to discharge her home with appropriate outpatient follow up.  Dr. Lovell Sheehan is on vacation, currently she is instructed to call and make an appointment with him as soon as he is back  from vacation, hopefully, in 1-2 weeks.  She is also to follow up with her primary care physician in 1 week.  She has been instructed if she have any neurological symptoms such as urinary incontinence or saddle paresthesias or worsening any lower extremity weakness that she is to return to the emergency department.  I have started her on a low- dose narcotic.  She also had a C-spine MRI which showed spondylosis of the cervical spine with disk bulging at C6-C7.  She has been afebrile.  Vital signs have been stable.  GENERAL:  Alert and oriented x4.  No apparent distress, cooperative friendly.  Heart is regular rate and rhythm without murmurs, rubs, or gallops.  CHEST: Clear to auscultation bilaterally. No wheezes, rhonchi, or crackles, or rales.  ABDOMEN:  Soft, nontender, nondistended.  Positive bowel sounds. No hepatosplenomegaly.  Extremities:  No clubbing, cyanosis or edema. Psych:  Normal affect.  NEURO:  Cranial II through XII grossly intact. No focal neurologic deficits.  Her strength is 5/5 strength in upper and lower extremity and equal.  Reflexes are intact.  Bladder function is normal.  Bowel function is normal.  Again, she is being discharged  with close outpatient follow up with primary care physician and Neurosurgery.  Please see discharge med rec sheet for full details of medications.          ______________________________ Tarry Kos, MD     RD/MEDQ  D:  02/17/2011  T:  02/17/2011  Job:  [id number]  Electronically Signed by Tarry Kos MD on 02/23/2011 12:09:13 PM

## 2011-03-01 ENCOUNTER — Other Ambulatory Visit: Payer: Self-pay | Admitting: Neurosurgery

## 2011-03-07 ENCOUNTER — Other Ambulatory Visit: Payer: Medicare Other

## 2011-03-07 NOTE — Consult Note (Signed)
[name], LANTIGUA NO.:  [id number]  MEDICAL RECORD NO.:  [id number]  LOCATION:  4715                         FACILITY:  [facility]  PHYSICIAN:  Donalee Citrin, M.D.        DATE OF BIRTH:  01/05/1935  DATE OF CONSULTATION:  02/14/2011 DATE OF DISCHARGE:                                CONSULTATION   REASON FOR CONSULTATION:  Low back and leg pain.  HISTORY OF PRESENT ILLNESS:  The patient is a 76 year old female who is a patient of Dr. Tressie Stalker who has followed her recently a week and half ago for low back pain.  Per the patient report, she followed up with him with worsening low back pain that was radiating to both thighs and on physical exam, she said he noticed some reflex changes in her legs and has orders cervical spine MRI, so she has been at home waiting for the cervical spine MRI to get scheduled pending insurance approval. The patient came to the ER because this morning she had worsening of her low back pain.  She says her leg pain is the same.  She denies any new or worsening pain into her legs.  She did say that she was only able to urinate a small amount.  However, apparently in the ER reports back from 3:30 this afternoon with the in and out catheter when I put a catheter in I only found 100 mL in the bladder, and she was noted to be dehydrated by the hospitalist, so this could possibly explain this.  PAST MEDICAL HISTORY:  Fairly remarkable for depression, diverticulitis, irritable bowel syndrome, and previous MI.  She has allergies to [facility] and SULFA.  CURRENT MEDICATION LIST:  Alprazolam, aspirin, carvedilol, citalopram, and omeprazole.  Laboratory data on admission revealed the patient's hematocrit to be 33, white count of 8.7, platelets 256.  Her sodium is 136.  Her potassium is 3.7.  Her BUN and creatinine were 10 and 0.6.  Urinalysis was negative.  PHYSICAL EXAMINATION:  The patient is awake and alert 76 year old female in no acute  distress, sitting up in bed.  Strength is somewhat pain limited approximately 4+/5 in iliopsoas, but quadriceps, hamstrings, gastrocs, and EHL are all 5/5.  Reflexes are brisk.  She does have clonus in both legs up, but only in the left leg.  The right knee jerk is unable to be assessed as she has had a knee replacement.  She does not appear to have upgoing toes, however, but the clonus was fairly impressive.  Upper extremity strength is completely asymptomatic, and her strength is diffusely 4+/5.  She is a little bit weak in the triceps, but this I think is somewhat consistent with her somewhat debilitated and cachectic appearance.  Her MRI scan from January 10, 2011 shows a mild grade 1 spondylosis with some fluid in the left side of the facet joint, some mild stenosis, but nothing that would cause cauda equina syndrome, so the [facility] are repeating a lumbar MRI.  Dr. Lovell Sheehan has attempted to order a cervical MRI.  I think she needs a cervical and thoracic to workup her myelopathy.  She is  somewhat asymptomatic in her arms even though they are diffusely weak I think it is more that she has got some diffuse wasting as opposed to definitely a symptomatology or weakness in any muscle group in her arms, but certainly the clinical presentation here could be either a cervical thoracic myelopathy on the top of lumbar spondylosis with mild spondylolisthesis and stenosis, so she is getting Pan MRI of the cervical, thoracic, and lumbar.  She will be admitted to the Hospital Service for pain control.  I do not think she has any significant weakness in her lower extremities, which require emergent intervention, but would await and see what the followup MRI shows.          ______________________________ Donalee Citrin, M.D.     GC/MEDQ  D:  02/14/2011  T:  02/15/2011  Job:  [id number]  Electronically Signed by Donalee Citrin M.D. on 03/07/2011 10:17:53 AM

## 2011-03-08 ENCOUNTER — Ambulatory Visit
Admission: RE | Admit: 2011-03-08 | Discharge: 2011-03-08 | Disposition: A | Discharge status: Home / Self Care | Payer: Medicare Other | Source: Ambulatory Visit | Attending: Neurosurgery | Admitting: Neurosurgery

## 2011-06-18 ENCOUNTER — Other Ambulatory Visit: Payer: Self-pay | Admitting: Neurosurgery

## 2011-07-10 ENCOUNTER — Ambulatory Visit: Payer: Medicare Other | Attending: Neurosurgery | Admitting: Physical Therapy

## 2011-07-10 DIAGNOSIS — R5381 Other malaise: Secondary | ICD-10-CM | POA: Insufficient documentation

## 2011-07-10 DIAGNOSIS — M545 Low back pain, unspecified: Secondary | ICD-10-CM | POA: Insufficient documentation

## 2011-07-10 DIAGNOSIS — IMO0001 Reserved for inherently not codable concepts without codable children: Secondary | ICD-10-CM | POA: Insufficient documentation

## 2011-07-12 ENCOUNTER — Ambulatory Visit: Payer: Medicare Other | Admitting: *Deleted

## 2011-07-19 ENCOUNTER — Ambulatory Visit: Payer: Medicare Other | Admitting: Physical Therapy

## 2011-07-23 ENCOUNTER — Ambulatory Visit: Payer: Medicare Other | Admitting: Physical Therapy

## 2011-07-26 ENCOUNTER — Ambulatory Visit: Payer: Medicare Other | Attending: Neurosurgery | Admitting: *Deleted

## 2011-07-26 ENCOUNTER — Encounter (HOSPITAL_COMMUNITY): Payer: Self-pay

## 2011-07-30 ENCOUNTER — Ambulatory Visit (HOSPITAL_COMMUNITY)
Admission: RE | Admit: 2011-07-30 | Discharge: 2011-07-30 | Disposition: A | Discharge status: Home / Self Care | Payer: Medicare Other | Source: Ambulatory Visit | Attending: Anesthesiology | Admitting: Anesthesiology

## 2011-07-30 ENCOUNTER — Encounter (HOSPITAL_COMMUNITY)
Admission: RE | Admit: 2011-07-30 | Discharge: 2011-07-30 | Disposition: A | Discharge status: Home / Self Care | Payer: Medicare Other | Source: Ambulatory Visit | Attending: Neurosurgery | Admitting: Neurosurgery

## 2011-07-30 ENCOUNTER — Encounter (HOSPITAL_COMMUNITY): Payer: Self-pay

## 2011-07-30 DIAGNOSIS — Z01811 Encounter for preprocedural respiratory examination: Secondary | ICD-10-CM | POA: Insufficient documentation

## 2011-07-30 DIAGNOSIS — Z01812 Encounter for preprocedural laboratory examination: Secondary | ICD-10-CM | POA: Insufficient documentation

## 2011-07-30 DIAGNOSIS — Z01818 Encounter for other preprocedural examination: Secondary | ICD-10-CM | POA: Insufficient documentation

## 2011-07-30 HISTORY — DX: Anxiety disorder, unspecified: F41.9

## 2011-07-30 HISTORY — DX: Atherosclerotic heart disease of native coronary artery without angina pectoris: I25.10

## 2011-07-30 HISTORY — DX: Reserved for inherently not codable concepts without codable children: IMO0001

## 2011-07-30 HISTORY — DX: Shortness of breath: R06.02

## 2011-07-30 HISTORY — DX: Headache: R51

## 2011-07-30 HISTORY — DX: Major depressive disorder, single episode, unspecified: F32.9

## 2011-07-30 HISTORY — DX: Anemia, unspecified: D64.9

## 2011-07-30 HISTORY — DX: Encounter for other specified aftercare: Z51.89

## 2011-07-30 HISTORY — DX: Depression, unspecified: F32.A

## 2011-07-30 LAB — CBC
MCV: 90.2 fL (ref 78.0–100.0)
Platelets: 307 10*3/uL (ref 150–400)
RDW: 12.3 % (ref 11.5–15.5)
WBC: 9.6 10*3/uL (ref 4.0–10.5)

## 2011-07-30 LAB — BASIC METABOLIC PANEL
CO2: 27 mEq/L (ref 19–32)
Calcium: 9 mg/dL (ref 8.4–10.5)
Creatinine, Ser: 0.62 mg/dL (ref 0.50–1.10)

## 2011-07-30 LAB — TYPE AND SCREEN

## 2011-07-30 NOTE — Pre-Procedure Instructions (Signed)
20 [name]  07/30/2011   Your procedure is scheduled on:  08/07/10  Report to [facility] at 6:00 AM-patient states per MD office.  Call this number if you have problems the morning of surgery: [phone]   Remember:   Do not eat food:After Midnight.  May have clear liquids: up to 4 Hours before arrival.  Clear liquids include soda, tea, black coffee, apple or grape juice, broth.  Take these medicines the morning of surgery with A SIP OF WATER: Tylenol, Xanax, Coreg, Lexapro, Norco, Prilosec   Do not wear jewelry, make-up or nail polish.  Do not wear lotions, powders, or perfumes. You may wear deodorant.  Do not shave 48 hours prior to surgery.  Do not bring valuables to the hospital.  Contacts, dentures or bridgework may not be worn into surgery.  Leave suitcase in the car. After surgery it may be brought to your room.  For patients admitted to the hospital, checkout time is 11:00 AM the day of discharge.   Patients discharged the day of surgery will not be allowed to drive home.  Name and phone number of your driver: Being admitted.  Special Instructions: CHG Shower Use Special Wash: 1/2 bottle night before surgery and 1/2 bottle morning of surgery. and N/A   Please read over the following fact sheets that you were given: Pain Booklet, Coughing and Deep Breathing, Blood Transfusion Information, MRSA Information and Surgical Site Infection Prevention

## 2011-07-30 NOTE — Progress Notes (Signed)
Discussed cardiac hx with Shonna Chock PA-no need to see patient preop. Myoview was normal and TEE EF 60-65 %. Found cardiac clearance note in EPIC and printed. Patient had stated Dr. Excell Seltzer had given Dr. Lovell Sheehan clearance for surgery.

## 2011-07-31 ENCOUNTER — Ambulatory Visit: Payer: Medicare Other | Admitting: *Deleted

## 2011-08-01 NOTE — Consult Note (Signed)
Anesthesia:  Patient is a 77 year old female scheduled for lumbar lami and fusion on 08/08/11.  Her history includes Takotsubo CM, HLD, GERD, IBS, anemia, depression, anxiety, headaches, and CAD.    Her Cardiologist is Dr. Excell Seltzer who felt she would be low risk for this procedure.    Echo from 07/21/08 showed: - Overall left ventricular systolic function was normal. Left ventricular ejection fraction was estimated , range being 55 % to 60 %. There were no left ventricular regional wall motion abnormalities. There was mild focal basal septal hypertrophy. - The left atrium was mildly dilated.  She had a normal nuclear stress test on 10/24/10 with EF 82%.  Her last cath was in August of 2007.  See Notes tab for results.  CXR and labs reviewed.  Plan to proceed.

## 2011-08-02 ENCOUNTER — Ambulatory Visit: Payer: Medicare Other | Admitting: *Deleted

## 2011-08-06 ENCOUNTER — Ambulatory Visit: Payer: Medicare Other | Admitting: Physical Therapy

## 2011-08-07 MED ORDER — CEFAZOLIN SODIUM 1-5 GM-% IV SOLN
1.0000 g | INTRAVENOUS | Status: AC
  Administered 2011-08-08: 1 g via INTRAVENOUS
  Filled 2011-08-07: qty 50

## 2011-08-08 ENCOUNTER — Encounter (HOSPITAL_COMMUNITY): Payer: Self-pay | Admitting: *Deleted

## 2011-08-08 ENCOUNTER — Inpatient Hospital Stay (HOSPITAL_COMMUNITY)
Admission: RE | Admit: 2011-08-08 | Discharge: 2011-08-14 | DRG: 460 | Disposition: A | Discharge status: Home Health | Payer: Medicare Other | Source: Ambulatory Visit | Attending: Neurosurgery | Admitting: Neurosurgery

## 2011-08-08 ENCOUNTER — Encounter (HOSPITAL_COMMUNITY)
Admission: RE | Disposition: A | Discharge status: Home Health | Payer: Self-pay | Source: Ambulatory Visit | Attending: Neurosurgery

## 2011-08-08 ENCOUNTER — Ambulatory Visit (HOSPITAL_COMMUNITY): Payer: Medicare Other

## 2011-08-08 ENCOUNTER — Encounter (HOSPITAL_COMMUNITY): Payer: Self-pay | Admitting: Vascular Surgery

## 2011-08-08 ENCOUNTER — Ambulatory Visit (HOSPITAL_COMMUNITY): Payer: Medicare Other | Admitting: Vascular Surgery

## 2011-08-08 DIAGNOSIS — M48062 Spinal stenosis, lumbar region with neurogenic claudication: Secondary | ICD-10-CM | POA: Diagnosis present

## 2011-08-08 DIAGNOSIS — Z8679 Personal history of other diseases of the circulatory system: Secondary | ICD-10-CM

## 2011-08-08 DIAGNOSIS — I2510 Atherosclerotic heart disease of native coronary artery without angina pectoris: Secondary | ICD-10-CM | POA: Diagnosis present

## 2011-08-08 DIAGNOSIS — M4310 Spondylolisthesis, site unspecified: Principal | ICD-10-CM | POA: Diagnosis present

## 2011-08-08 DIAGNOSIS — F411 Generalized anxiety disorder: Secondary | ICD-10-CM | POA: Diagnosis present

## 2011-08-08 DIAGNOSIS — Z8659 Personal history of other mental and behavioral disorders: Secondary | ICD-10-CM

## 2011-08-08 DIAGNOSIS — M51379 Other intervertebral disc degeneration, lumbosacral region without mention of lumbar back pain or lower extremity pain: Secondary | ICD-10-CM | POA: Diagnosis present

## 2011-08-08 DIAGNOSIS — Z882 Allergy status to sulfonamides status: Secondary | ICD-10-CM

## 2011-08-08 DIAGNOSIS — Z7982 Long term (current) use of aspirin: Secondary | ICD-10-CM

## 2011-08-08 DIAGNOSIS — M5137 Other intervertebral disc degeneration, lumbosacral region: Secondary | ICD-10-CM | POA: Diagnosis present

## 2011-08-08 DIAGNOSIS — R0609 Other forms of dyspnea: Secondary | ICD-10-CM | POA: Diagnosis present

## 2011-08-08 DIAGNOSIS — M4316 Spondylolisthesis, lumbar region: Secondary | ICD-10-CM

## 2011-08-08 DIAGNOSIS — R51 Headache: Secondary | ICD-10-CM | POA: Diagnosis present

## 2011-08-08 DIAGNOSIS — K219 Gastro-esophageal reflux disease without esophagitis: Secondary | ICD-10-CM | POA: Diagnosis present

## 2011-08-08 DIAGNOSIS — Z8719 Personal history of other diseases of the digestive system: Secondary | ICD-10-CM

## 2011-08-08 DIAGNOSIS — R0989 Other specified symptoms and signs involving the circulatory and respiratory systems: Secondary | ICD-10-CM | POA: Diagnosis present

## 2011-08-08 LAB — BASIC METABOLIC PANEL
BUN: 12 mg/dL (ref 6–23)
CO2: 28 mEq/L (ref 19–32)
Chloride: 101 mEq/L (ref 96–112)
Creatinine, Ser: 0.87 mg/dL (ref 0.50–1.10)

## 2011-08-08 SURGERY — POSTERIOR LUMBAR FUSION 1 LEVEL
Anesthesia: General | Site: Back | Laterality: Bilateral | Wound class: Clean

## 2011-08-08 MED ORDER — POTASSIUM CHLORIDE IN NACL 20-0.9 MEQ/L-% IV SOLN
INTRAVENOUS | Status: DC
  Administered 2011-08-08 – 2011-08-10 (×2): via INTRAVENOUS
  Filled 2011-08-08 (×13): qty 1000

## 2011-08-08 MED ORDER — BACITRACIN ZINC 500 UNIT/GM EX OINT
TOPICAL_OINTMENT | CUTANEOUS | Status: DC | PRN
  Administered 2011-08-08: 1 via TOPICAL

## 2011-08-08 MED ORDER — HYDROCODONE-ACETAMINOPHEN 5-325 MG PO TABS
1.0000 | ORAL_TABLET | ORAL | Status: DC | PRN
  Administered 2011-08-12 – 2011-08-14 (×6): 1 via ORAL
  Filled 2011-08-08 (×4): qty 1
  Filled 2011-08-08: qty 2
  Filled 2011-08-08 (×2): qty 1

## 2011-08-08 MED ORDER — NEOSTIGMINE METHYLSULFATE 1 MG/ML IJ SOLN
INTRAMUSCULAR | Status: DC | PRN
  Administered 2011-08-08: 3 mg via INTRAVENOUS

## 2011-08-08 MED ORDER — SODIUM CHLORIDE 0.9 % IV BOLUS (SEPSIS)
1000.0000 mL | Freq: Once | INTRAVENOUS | Status: AC
  Administered 2011-08-08: 1000 mL via INTRAVENOUS

## 2011-08-08 MED ORDER — BUPIVACAINE LIPOSOME 1.3 % IJ SUSP
INTRAMUSCULAR | Status: DC | PRN
  Administered 2011-08-08: 20 mL

## 2011-08-08 MED ORDER — DROPERIDOL 2.5 MG/ML IJ SOLN: 0.6250 mg | INTRAMUSCULAR | Status: DC | PRN

## 2011-08-08 MED ORDER — BUPIVACAINE LIPOSOME 1.3 % IJ SUSP
20.0000 mL | INTRAMUSCULAR | Status: AC
  Filled 2011-08-08: qty 20

## 2011-08-08 MED ORDER — MORPHINE SULFATE (PF) 1 MG/ML IV SOLN
INTRAVENOUS | Status: DC
  Administered 2011-08-08: 13:00:00 via INTRAVENOUS
  Filled 2011-08-08: qty 25

## 2011-08-08 MED ORDER — NITROGLYCERIN 0.4 MG SL SUBL: 0.4000 mg | SUBLINGUAL_TABLET | SUBLINGUAL | Status: DC | PRN

## 2011-08-08 MED ORDER — ACETAMINOPHEN 650 MG RE SUPP: 650.0000 mg | RECTAL | Status: DC | PRN

## 2011-08-08 MED ORDER — SODIUM CHLORIDE 0.9 % IR SOLN
Status: DC | PRN
  Administered 2011-08-08: 09:00:00

## 2011-08-08 MED ORDER — VECURONIUM BROMIDE 10 MG IV SOLR
INTRAVENOUS | Status: DC | PRN
  Administered 2011-08-08 (×2): 2 mg via INTRAVENOUS
  Administered 2011-08-08: 6 mg via INTRAVENOUS

## 2011-08-08 MED ORDER — DIPHENHYDRAMINE HCL 12.5 MG/5ML PO ELIX: 12.5000 mg | ORAL_SOLUTION | Freq: Four times a day (QID) | ORAL | Status: DC | PRN

## 2011-08-08 MED ORDER — THROMBIN 20000 UNITS EX KIT
PACK | CUTANEOUS | Status: DC | PRN
  Administered 2011-08-08: 09:00:00 via TOPICAL

## 2011-08-08 MED ORDER — CEFAZOLIN SODIUM 1-5 GM-% IV SOLN
1.0000 g | Freq: Three times a day (TID) | INTRAVENOUS | Status: AC
  Administered 2011-08-08 (×2): 1 g via INTRAVENOUS
  Filled 2011-08-08 (×2): qty 50

## 2011-08-08 MED ORDER — GLYCOPYRROLATE 0.2 MG/ML IJ SOLN
INTRAMUSCULAR | Status: DC | PRN
  Administered 2011-08-08: .6 mg via INTRAVENOUS

## 2011-08-08 MED ORDER — DOCUSATE SODIUM 100 MG PO CAPS
100.0000 mg | ORAL_CAPSULE | Freq: Two times a day (BID) | ORAL | Status: DC
  Administered 2011-08-08 – 2011-08-14 (×10): 100 mg via ORAL
  Filled 2011-08-08 (×10): qty 1

## 2011-08-08 MED ORDER — FENTANYL CITRATE 0.05 MG/ML IJ SOLN
INTRAMUSCULAR | Status: DC | PRN
  Administered 2011-08-08: 50 ug via INTRAVENOUS
  Administered 2011-08-08: 100 ug via INTRAVENOUS

## 2011-08-08 MED ORDER — CARVEDILOL 6.25 MG PO TABS
6.2500 mg | ORAL_TABLET | Freq: Two times a day (BID) | ORAL | Status: DC
  Administered 2011-08-10 – 2011-08-14 (×9): 6.25 mg via ORAL
  Filled 2011-08-08 (×14): qty 1

## 2011-08-08 MED ORDER — EPHEDRINE SULFATE 50 MG/ML IJ SOLN
INTRAMUSCULAR | Status: DC | PRN
  Administered 2011-08-08: 2.5 mg via INTRAVENOUS
  Administered 2011-08-08 (×3): 5 mg via INTRAVENOUS

## 2011-08-08 MED ORDER — ONDANSETRON HCL 4 MG/2ML IJ SOLN: 4.0000 mg | Freq: Four times a day (QID) | INTRAMUSCULAR | Status: DC | PRN

## 2011-08-08 MED ORDER — BACITRACIN 50000 UNITS IM SOLR
INTRAMUSCULAR | Status: AC
  Filled 2011-08-08: qty 1

## 2011-08-08 MED ORDER — ONDANSETRON HCL 4 MG/2ML IJ SOLN
INTRAMUSCULAR | Status: DC | PRN
  Administered 2011-08-08: 4 mg via INTRAVENOUS

## 2011-08-08 MED ORDER — 0.9 % SODIUM CHLORIDE (POUR BTL) OPTIME
TOPICAL | Status: DC | PRN
  Administered 2011-08-08: 1000 mL

## 2011-08-08 MED ORDER — NALOXONE HCL 0.4 MG/ML IJ SOLN: 0.4000 mg | INTRAMUSCULAR | Status: DC | PRN

## 2011-08-08 MED ORDER — LACTATED RINGERS IV SOLN
INTRAVENOUS | Status: DC | PRN
  Administered 2011-08-08 (×2): via INTRAVENOUS

## 2011-08-08 MED ORDER — MORPHINE SULFATE (PF) 1 MG/ML IV SOLN: INTRAVENOUS | Status: AC

## 2011-08-08 MED ORDER — THERA M PLUS PO TABS
1.0000 | ORAL_TABLET | Freq: Every day | ORAL | Status: DC
  Administered 2011-08-09 – 2011-08-14 (×4): 1 via ORAL
  Filled 2011-08-08 (×7): qty 1

## 2011-08-08 MED ORDER — PROPOFOL 10 MG/ML IV EMUL
INTRAVENOUS | Status: DC | PRN
  Administered 2011-08-08: 150 mg via INTRAVENOUS

## 2011-08-08 MED ORDER — SODIUM CHLORIDE 0.9 % IJ SOLN: 9.0000 mL | INTRAMUSCULAR | Status: DC | PRN

## 2011-08-08 MED ORDER — MENTHOL 3 MG MT LOZG: 1.0000 | LOZENGE | OROMUCOSAL | Status: DC | PRN

## 2011-08-08 MED ORDER — OXYCODONE-ACETAMINOPHEN 5-325 MG PO TABS
1.0000 | ORAL_TABLET | ORAL | Status: DC | PRN
  Administered 2011-08-09 (×3): 1 via ORAL
  Filled 2011-08-08 (×3): qty 1

## 2011-08-08 MED ORDER — PHENOL 1.4 % MT LIQD
1.0000 | OROMUCOSAL | Status: DC | PRN
  Administered 2011-08-10 – 2011-08-11 (×3): 1 via OROMUCOSAL
  Filled 2011-08-08: qty 177

## 2011-08-08 MED ORDER — ONDANSETRON HCL 4 MG/2ML IJ SOLN: 4.0000 mg | INTRAMUSCULAR | Status: DC | PRN

## 2011-08-08 MED ORDER — MORPHINE SULFATE (PF) 1 MG/ML IV SOLN
INTRAVENOUS | Status: DC
  Administered 2011-08-08: 8 mg via INTRAVENOUS
  Administered 2011-08-09: 4 mg via INTRAVENOUS
  Administered 2011-08-09: 3 mg via INTRAVENOUS
  Administered 2011-08-09: 4.88 mg via INTRAVENOUS
  Administered 2011-08-09: 5 mg via INTRAVENOUS
  Administered 2011-08-10: 3.99 mg via INTRAVENOUS
  Filled 2011-08-08: qty 25

## 2011-08-08 MED ORDER — SODIUM CHLORIDE 0.9 % IV SOLN
INTRAVENOUS | Status: AC
  Filled 2011-08-08: qty 500

## 2011-08-08 MED ORDER — ZOLPIDEM TARTRATE 5 MG PO TABS: 5.0000 mg | ORAL_TABLET | Freq: Every evening | ORAL | Status: DC | PRN

## 2011-08-08 MED ORDER — PANTOPRAZOLE SODIUM 40 MG PO TBEC
40.0000 mg | DELAYED_RELEASE_TABLET | Freq: Every day | ORAL | Status: DC
  Administered 2011-08-09 – 2011-08-14 (×5): 40 mg via ORAL
  Filled 2011-08-08 (×4): qty 1

## 2011-08-08 MED ORDER — HYDROMORPHONE HCL PF 1 MG/ML IJ SOLN
0.2500 mg | INTRAMUSCULAR | Status: DC | PRN
Start: 2011-08-08 — End: 2011-08-08
  Administered 2011-08-08 (×2): 0.5 mg via INTRAVENOUS

## 2011-08-08 MED ORDER — LACTATED RINGERS IV SOLN
INTRAVENOUS | Status: DC
  Administered 2011-08-08: 16:00:00 via INTRAVENOUS

## 2011-08-08 MED ORDER — LACTATED RINGERS IV BOLUS (SEPSIS)
500.0000 mL | Freq: Once | INTRAVENOUS | Status: AC
  Administered 2011-08-08: 500 mL via INTRAVENOUS

## 2011-08-08 MED ORDER — ACETAMINOPHEN 325 MG PO TABS: 650.0000 mg | ORAL_TABLET | ORAL | Status: DC | PRN

## 2011-08-08 MED ORDER — WHITE PETROLATUM GEL
Status: AC
  Filled 2011-08-08: qty 5

## 2011-08-08 MED ORDER — HYDROMORPHONE HCL PF 1 MG/ML IJ SOLN
INTRAMUSCULAR | Status: AC
  Filled 2011-08-08: qty 1

## 2011-08-08 MED ORDER — CYCLOBENZAPRINE HCL 10 MG PO TABS
10.0000 mg | ORAL_TABLET | Freq: Three times a day (TID) | ORAL | Status: DC | PRN
  Administered 2011-08-08 – 2011-08-14 (×7): 10 mg via ORAL
  Filled 2011-08-08 (×6): qty 1

## 2011-08-08 MED ORDER — ALPRAZOLAM 0.5 MG PO TABS
0.5000 mg | ORAL_TABLET | Freq: Three times a day (TID) | ORAL | Status: DC | PRN
  Administered 2011-08-10 – 2011-08-13 (×2): 0.5 mg via ORAL
  Filled 2011-08-08 (×3): qty 1

## 2011-08-08 MED ORDER — DIPHENHYDRAMINE HCL 50 MG/ML IJ SOLN: 12.5000 mg | Freq: Four times a day (QID) | INTRAMUSCULAR | Status: DC | PRN

## 2011-08-08 MED ORDER — ESCITALOPRAM OXALATE 20 MG PO TABS
20.0000 mg | ORAL_TABLET | Freq: Two times a day (BID) | ORAL | Status: DC
  Administered 2011-08-08 – 2011-08-14 (×12): 20 mg via ORAL
  Filled 2011-08-08 (×13): qty 1

## 2011-08-08 MED ORDER — BUPIVACAINE-EPINEPHRINE PF 0.5-1:200000 % IJ SOLN
INTRAMUSCULAR | Status: DC | PRN
  Administered 2011-08-08: 10 mL

## 2011-08-08 SURGICAL SUPPLY — 64 items
BAG DECANTER FOR FLEXI CONT (MISCELLANEOUS) ×2 IMPLANT
BENZOIN TINCTURE PRP APPL 2/3 (GAUZE/BANDAGES/DRESSINGS) ×2 IMPLANT
BLADE SURG ROTATE 9660 (MISCELLANEOUS) IMPLANT
BRUSH SCRUB EZ PLAIN DRY (MISCELLANEOUS) ×2 IMPLANT
BUR ACORN 6.0 (BURR) ×2 IMPLANT
BUR MATCHSTICK NEURO 3.0 LAGG (BURR) ×2 IMPLANT
CANISTER SUCTION 2500CC (MISCELLANEOUS) ×2 IMPLANT
CAP REVERE LOCKING (Cap) ×8 IMPLANT
CLOTH BEACON ORANGE TIMEOUT ST (SAFETY) ×2 IMPLANT
CONT SPEC 4OZ CLIKSEAL STRL BL (MISCELLANEOUS) ×2 IMPLANT
COVER BACK TABLE 24X17X13 BIG (DRAPES) IMPLANT
DRAPE C-ARM 42X72 X-RAY (DRAPES) ×4 IMPLANT
DRAPE LAPAROTOMY 100X72X124 (DRAPES) ×2 IMPLANT
DRAPE POUCH INSTRU U-SHP 10X18 (DRAPES) ×2 IMPLANT
DRAPE SURG 17X23 STRL (DRAPES) ×8 IMPLANT
ELECT BLADE 4.0 EZ CLEAN MEGAD (MISCELLANEOUS) ×2
ELECT REM PT RETURN 9FT ADLT (ELECTROSURGICAL) ×2
ELECTRODE BLDE 4.0 EZ CLN MEGD (MISCELLANEOUS) ×1 IMPLANT
ELECTRODE REM PT RTRN 9FT ADLT (ELECTROSURGICAL) ×1 IMPLANT
GAUZE SPONGE 4X4 12PLY STRL LF (GAUZE/BANDAGES/DRESSINGS) ×2 IMPLANT
GAUZE SPONGE 4X4 16PLY XRAY LF (GAUZE/BANDAGES/DRESSINGS) ×2 IMPLANT
GLOVE BIO SURGEON STRL SZ8.5 (GLOVE) ×4 IMPLANT
GLOVE BIOGEL M 8.0 STRL (GLOVE) ×2 IMPLANT
GLOVE BIOGEL PI IND STRL 7.0 (GLOVE) ×1 IMPLANT
GLOVE BIOGEL PI INDICATOR 7.0 (GLOVE) ×1
GLOVE EXAM NITRILE LRG STRL (GLOVE) IMPLANT
GLOVE EXAM NITRILE MD LF STRL (GLOVE) ×4 IMPLANT
GLOVE EXAM NITRILE XL STR (GLOVE) IMPLANT
GLOVE EXAM NITRILE XS STR PU (GLOVE) IMPLANT
GLOVE SS BIOGEL STRL SZ 8 (GLOVE) ×2 IMPLANT
GLOVE SUPERSENSE BIOGEL SZ 8 (GLOVE) ×2
GLOVE SURG SS PI 6.5 STRL IVOR (GLOVE) ×6 IMPLANT
GOWN BRE IMP SLV AUR LG STRL (GOWN DISPOSABLE) ×2 IMPLANT
GOWN BRE IMP SLV AUR XL STRL (GOWN DISPOSABLE) ×6 IMPLANT
GOWN STRL REIN 2XL LVL4 (GOWN DISPOSABLE) IMPLANT
KIT BASIN OR (CUSTOM PROCEDURE TRAY) ×2 IMPLANT
KIT ROOM TURNOVER OR (KITS) ×2 IMPLANT
NEEDLE HYPO 21X1.5 SAFETY (NEEDLE) ×2 IMPLANT
NEEDLE HYPO 22GX1.5 SAFETY (NEEDLE) ×2 IMPLANT
NS IRRIG 1000ML POUR BTL (IV SOLUTION) ×2 IMPLANT
PACK FOAM VITOSS 10CC (Orthopedic Implant) ×2 IMPLANT
PACK LAMINECTOMY NEURO (CUSTOM PROCEDURE TRAY) ×2 IMPLANT
PAD ARMBOARD 7.5X6 YLW CONV (MISCELLANEOUS) ×6 IMPLANT
PATTIES SURGICAL .5 X1 (DISPOSABLE) IMPLANT
PUTTY 10ML ACTIFUSE ABX (Putty) ×2 IMPLANT
ROD REVERE 6.35 40MM (Rod) ×4 IMPLANT
SCREW 7.5X50MM (Screw) ×4 IMPLANT
SCREW REVERE 6.35 75X55MM (Screw) ×4 IMPLANT
SPACER SUSTAIN O 10X26 12MM (Spacer) ×4 IMPLANT
SPONGE GAUZE 4X4 12PLY (GAUZE/BANDAGES/DRESSINGS) ×2 IMPLANT
SPONGE LAP 4X18 X RAY DECT (DISPOSABLE) ×2 IMPLANT
SPONGE NEURO XRAY DETECT 1X3 (DISPOSABLE) IMPLANT
SPONGE SURGIFOAM ABS GEL 100 (HEMOSTASIS) ×2 IMPLANT
STRIP CLOSURE SKIN 1/2X4 (GAUZE/BANDAGES/DRESSINGS) ×2 IMPLANT
SUT VIC AB 1 CT1 18XBRD ANBCTR (SUTURE) ×2 IMPLANT
SUT VIC AB 1 CT1 8-18 (SUTURE) ×2
SUT VIC AB 2-0 CP2 18 (SUTURE) ×4 IMPLANT
SYR 20CC LL (SYRINGE) ×2 IMPLANT
SYR 20ML ECCENTRIC (SYRINGE) ×2 IMPLANT
TAPE HYPAFIX 4 X10 (GAUZE/BANDAGES/DRESSINGS) ×2 IMPLANT
TOWEL OR 17X24 6PK STRL BLUE (TOWEL DISPOSABLE) ×2 IMPLANT
TOWEL OR 17X26 10 PK STRL BLUE (TOWEL DISPOSABLE) ×2 IMPLANT
TRAY FOLEY CATH 14FRSI W/METER (CATHETERS) ×2 IMPLANT
WATER STERILE IRR 1000ML POUR (IV SOLUTION) ×2 IMPLANT

## 2011-08-08 NOTE — Op Note (Signed)
Brief history: Patient is a 77 year old white female who has suffered from back buttocks and leg pain consistent with neurogenic claudication. She has failed medical management and was worked up with a lumbar MRI which demonstrated she had at L4-5 spinal stasis and spinal stenosis. She also had at L3 compression fracture which we treated conservatively. Patient has weighed the risks benefits and alternatives of surgery decided proceed with an L4-5 decompression, instrumentation, and fusion.  Preoperative diagnosis: L4-5 Degenerative disc disease, spinal stenosis; lumbago; lumbar radiculopathy, spondylolisthesis  Postoperative diagnosis: L4-5 Degenerative disc disease, spinal stenosis; lumbago; lumbar radiculopathy, spondylolisthesis  Procedure: L4-5 Laminotomy/foraminotomies to decompress the bilateral L4 and L5 nerve roots(the work required to do this was in addition to the work required to do the posterior lumbar interbody fusion because of the patient's spinal stenosis, facet arthropathy. Etc. requiring a wide decompression of the nerve roots.); L4-5 posterior lumbar interbody fusion with local morselized autograft bone and Actifusebone graft extender; insertion of interbody prosthesis at L4-5 (globus peek interbody prosthesis); posterior nonsegmental instrumentation from L4 to L5 with globus titanium pedicle screws and rods; posterior lateral arthrodesis at L4-5 with local morselized autograft bone and Vitoss bone graft extender.  Surgeon: Dr. Delma Officer  Asst.: Dr. Hilda Lias  Anesthesia: Gen. endotracheal  Estimated blood loss: 200 cc  Drains: None  Locations: None  Description of procedure: The patient was brought to the operating room by the anesthesia team. General endotracheal anesthesia was induced. The patient was turned to the prone position on the Wilson frame. The patient's lumbosacral region was then prepared with Betadine scrub and Betadine solution. Sterile drapes were  applied.  I then injected the area to be incised with Marcaine with epinephrine solution. I then used the scalpel to make a linear midline incision over the L4-5 interspace. I then used electrocautery to perform a bilateral subperiosteal dissection exposing the spinous process and lamina of L4 and L5. We then obtained intraoperative radiograph to confirm our location. We then inserted the Verstrac retractor to provide exposure.  I began the decompression by using the high speed drill to perform laminotomies at L4. We then used the Kerrison punches to widen the laminotomy and removed the ligamentum flavum at L4-5 as well as the cephalad aspect of the L5 lamina. We used the Kerrison punches to remove the medial facets at L4-5. We performed wide foraminotomies about the bilateral L4 and L5 nerve roots completing the decompression.  We now turned our attention to the posterior lumbar interbody fusion. I used a scalpel to incise the intervertebral disc at L4-5. I then performed a partial intervertebral discectomy at L4-5 using the pituitary forceps. We prepared the vertebral endplates at L4-5 for the fusion by removing the soft tissues with the curettes. We then used the trial spacers to pick the appropriate sized interbody prosthesis. We prefilled his prosthesis with a combination of local morselized autograft bone that we obtained during the decompression as well as Actifuse bone graft extender. We inserted the prefilled prosthesis into the interspace at L4-5. There was a good snug fit of the prosthesis in the interspace. We then filled and the remainder of the intervertebral disc space with local morselized autograft bone and Actifuse. This completed the posterior lumbar interbody arthrodesis.  We now turned attention to the instrumentation. Under fluoroscopic guidance we cannulated the bilateral L4 and L5 pedicles with the bone probe. We then removed the bone probe. He then tapped the pedicle with a 6.5  millimeter tap. We then removed the  tap. We probed inside the tapped pedicle with a ball probe to rule out cortical breaches. We then inserted a 7.5 x 50 and 55 millimeter pedicle screw into the L4 and L5 pedicles bilaterally under fluoroscopic guidance. We then palpated along the medial aspect of the pedicles to rule out cortical breaches. There were none. The nerve roots were not injured. We then connected the unilateral pedicle screws with a lordotic rod. We compressed the construct and secured the rod in place with the caps. We then tightened the caps appropriately. This completed the instrumentation from L4-L5.  We now turned our attention to the posterior lateral arthrodesis at L4-5. We used the high-speed drill to decorticate the remainder of the facets, pars, transverse process at L4 and L5. We then applied a combination of local morselized autograft bone and Vitoss bone graft extender over these decorticated posterior lateral structures. This completed the posterior lateral arthrodesis.  We then obtained hemostasis using bipolar electrocautery. We irrigated the wound out with bacitracin solution. We inspected the thecal sac and nerve roots and noted they were well decompressed. We then removed the retractor. We reapproximated patient's thoracolumbar fascia with interrupted #1 Vicryl suture. We reapproximated patient's subcutaneous tissue with interrupted 2-0 Vicryl suture. The reapproximated patient's skin with Steri-Strips and benzoin. The wound was then coated with bacitracin ointment. A sterile dressing was applied. The drapes were removed. The patient was subsequently returned to the supine position where they were extubated by the anesthesia team. He was then transported to the post anesthesia care unit in stable condition. All sponge instrument and needle counts were correct at the end of this case.

## 2011-08-08 NOTE — Progress Notes (Signed)
BP 90/58  Pulse 85  Temp(Src) 97.8 F (36.6 C) (Oral)  Resp 16  SpO2 98% BMET    Component Value Date/Time   NA 135 08/08/2011 1850   K 4.0 08/08/2011 1850   CL 101 08/08/2011 1850   CO2 28 08/08/2011 1850   GLUCOSE 137* 08/08/2011 1850   BUN 12 08/08/2011 1850   CREATININE 0.87 08/08/2011 1850   CALCIUM 7.8* 08/08/2011 1850   GFRNONAA 64* 08/08/2011 1850   GFRAA 74* 08/08/2011 1850   Labs look good. No evidence acute renal failure. Will increase IV maintenance to 80cc/hr. Foley fully interrogated, it is functioning.

## 2011-08-08 NOTE — H&P (Signed)
Subjective: Patient is a 77 year old white female who complains of back hip and leg pain consistent with neurogenic claudication. She has failed medical management was worked up with a lumbar MRI which demonstrates spinal stenosis and spinal listhesis at L4-5. I discussed the various treatment options with the patient including surgery she wants to proceed with the operation.   Past Medical History  Diagnosis Date  . Takotsubo syndrome   . Other and unspecified hyperlipidemia   . Esophageal reflux   . Osteoarthrosis, unspecified whether generalized or localized, lower leg   . IBS (irritable bowel syndrome)   . Diverticular disease   . Shortness of breath     when my heart is not doing right  . Anemia   . Depression   . Anxiety   . Coronary artery disease   . Blood transfusion     2005 maybe  . Headache     takes Prilosec    Past Surgical History  Procedure Date  . Knee surgery   . Hysterotomy   . Breast reconstruction   . Implantable contact lens implantation   . Cataract extraction w/ intraocular lens  implant, bilateral   . Tonsillectomy     as child    Allergies  Allergen Reactions  . Sulfamethoxazole W/Trimethoprim Hives    History  Substance Use Topics  . Smoking status: Never Smoker   . Smokeless tobacco: Never Used  . Alcohol Use: No    History reviewed. No pertinent family history. Prior to Admission medications   Medication Sig Start Date End Date Taking? Authorizing Provider  acetaminophen (TYLENOL) 500 MG tablet Take 500 mg by mouth 3 (three) times daily as needed. For back pain.    Yes Historical Provider, MD  ALPRAZolam Prudy Feeler) 0.5 MG tablet Take 0.5 mg by mouth 3 (three) times daily as needed. For anxiety.   Yes Historical Provider, MD  calcium-vitamin D (OSCAL WITH D 500-200) 500-200 MG-UNIT per tablet Take 1 tablet by mouth daily.     Yes Historical Provider, MD  carvedilol (COREG) 6.25 MG tablet Take 6.25 mg by mouth 2 (two) times daily with a meal.      Yes Historical Provider, MD  Cholecalciferol (VITAMIN D) 2000 UNITS CAPS Take by mouth.     Yes Historical Provider, MD  Cholecalciferol (VITAMIN D3) 2000 UNITS TABS Take 1 capsule by mouth daily.     Yes Historical Provider, MD  escitalopram (LEXAPRO) 20 MG tablet Take 20 mg by mouth 2 (two) times daily.    Yes Historical Provider, MD  Multiple Vitamins-Minerals (MULTIVITAMINS THER. W/MINERALS) TABS Take 1 tablet by mouth daily.     Yes Historical Provider, MD  omeprazole (PRILOSEC) 20 MG capsule Take 20 mg by mouth daily.     Yes Historical Provider, MD  amoxicillin (AMOXIL) 500 MG tablet Take 2,000 mg by mouth daily as needed. Prn dental. 1 hour prior to dental procedure.    Historical Provider, MD  aspirin 81 MG tablet Take 81 mg by mouth daily.      Historical Provider, MD  HYDROcodone-acetaminophen (NORCO) 5-325 MG per tablet Take 1 tablet by mouth 2 (two) times daily as needed. For pain.     Historical Provider, MD  meloxicam (MOBIC) 7.5 MG tablet Take 7.5 mg by mouth daily as needed. For back pain.     Historical Provider, MD  nitroGLYCERIN (NITROSTAT) 0.4 MG SL tablet Place 0.4 mg under the tongue every 5 (five) minutes as needed. For chest pain.  Historical Provider, MD  vitamin B-12 (CYANOCOBALAMIN) 500 MCG tablet Take 500 mcg by mouth daily.      Historical Provider, MD     Review of Systems  Positive ROS: Negative except as above  All other systems have been reviewed and were otherwise negative with the exception of those mentioned in the HPI and as above.  Objective: Vital signs in last 24 hours: Temp:  [97.3 F (36.3 C)] 97.3 F (36.3 C) (01/16 0714) Pulse Rate:  [76] 76  (01/16 0714) Resp:  [16] 16  (01/16 0714) BP: (102)/(67) 102/67 mmHg (01/16 0714) SpO2:  [98 %] 98 % (01/16 0714)  General Appearance: Alert, cooperative, no distress, appears stated age Head: Normocephalic, without obvious abnormality, atraumatic Eyes: PERRL, conjunctiva/corneas clear, EOM's  intact, fundi benign, both eyes      Ears: Normal TM's and external ear canals, both ears Throat: Lips, mucosa, and tongue normal; teeth and gums normal Neck: Supple, symmetrical, trachea midline, no adenopathy; thyroid: No enlargement/tenderness/nodules; no carotid bruit or JVD Back: Symmetric, no curvature, ROM normal, no CVA tenderness Lungs: Clear to auscultation bilaterally, respirations unlabored Heart: Regular rate and rhythm, S1 and S2 normal, no murmur, rub or gallop Abdomen: Soft, non-tender, bowel sounds active all four quadrants, no masses, no organomegaly Extremities: Extremities normal, atraumatic, no cyanosis or edema Pulses: 2+ and symmetric all extremities Skin: Skin color, texture, turgor normal, no rashes or lesions  NEUROLOGIC:   Mental status: alert and oriented, no aphasia, good attention span, Fund of knowledge/ memory ok Motor Exam - grossly normal Sensory Exam - grossly normal Reflexes:  Coordination - grossly normal Gait - grossly normal Balance - grossly normal Cranial Nerves: I: smell Not tested  II: visual acuity  OS: Normal    OD: Normal   II: visual fields Full to confrontation  II: pupils Equal, round, reactive to light  III,VII: ptosis None  III,IV,VI: extraocular muscles  Full ROM  V: mastication Normal  V: facial light touch sensation  Normal  V,VII: corneal reflex  Present  VII: facial muscle function - upper  Normal  VII: facial muscle function - lower Normal  VIII: hearing Not tested  IX: soft palate elevation  Normal  IX,X: gag reflex Present  XI: trapezius strength  5/5  XI: sternocleidomastoid strength 5/5  XI: neck flexion strength  5/5  XII: tongue strength  Normal    Data Review Lab Results  Component Value Date   WBC 9.6 07/30/2011   HGB 12.7 07/30/2011   HCT 35.9* 07/30/2011   MCV 90.2 07/30/2011   PLT 307 07/30/2011   Lab Results  Component Value Date   NA 137 07/30/2011   K 4.1 07/30/2011   CL 101 07/30/2011   CO2 27 07/30/2011    BUN 7 07/30/2011   CREATININE 0.62 07/30/2011   GLUCOSE 109* 07/30/2011   Lab Results  Component Value Date   INR 1.11 02/15/2011    Assessment/Plan: L4-5 spinal listhesis spinal stenosis lumbago neurogenic claudication: I discussed situation with the patient. I have reviewed her MR scan with her. I discussed the various treatment options including surgery. I described the surgical option at L4-5 decompression instrumentation and fusion. I discussed the risks, benefits, alternatives and likelihood of achieving our goals with surgery. I have answered all her questions. She was to proceed with the operation.   Sean Malinowski D 08/08/2011 8:28 AM

## 2011-08-08 NOTE — Progress Notes (Addendum)
At approximately 1815 BP was rechecked for a result of a SBP at 79. There was no urine output and patient had received 500cc of 1000cc NS bolus at that time. Pt also more lethargic. Will respond appropriately to stimulation but quickly falls back to sleep. MD ordered a STAT BMET and asked to be called with results. Currently results are unavailable. Night RN was made aware of situation. Will continue to monitor.

## 2011-08-08 NOTE — Progress Notes (Signed)
Subjective:  The patient is somnolent but easily arousable. I've spoken to the patient's daughter and answer all her questions. Patient looks well.  Objective: Vital signs in last 24 hours: Temp:  [97.3 F (36.3 C)-98.2 F (36.8 C)] 97.8 F (36.6 C) (01/16 1345) Pulse Rate:  [64-80] 74  (01/16 1455) Resp:  [12-16] 16  (01/16 1315) BP: (88-147)/(47-84) 89/47 mmHg (01/16 1455) SpO2:  [97 %-100 %] 98 % (01/16 1345)  Intake/Output from previous day:   Intake/Output this shift: Total I/O In: 2600 [I.V.:2600] Out: 1925 [Urine:1625; Blood:300]  Physical exam patient is mildly somnolent but easily arousable. She is Glasgow Coma Scale 14. She is moving all 4 extremities well.  Lab Results: No results found for this basename: WBC:2,HGB:2,HCT:2,PLT:2 in the last 72 hours BMET No results found for this basename: NA:2,K:2,CL:2,CO2:2,GLUCOSE:2,BUN:2,CREATININE:2,CALCIUM:2 in the last 72 hours  Studies/Results: Dg Lumbar Spine 2-3 Views  08/08/2011  *RADIOLOGY REPORT*  Clinical Data: 77 year old female undergoing lumbar spine surgery.  LUMBAR SPINE - 2-3 VIEW  Comparison: [name] lumbar radiographs08/01/2011. [place] lumbar MRI 02/15/2011.  Fluoroscopy time of 0.2 minutes was utilized.  Findings: Chronic L3 and T12 compression fractures with normal lumbar segmentation depicted on the comparison.  These intraoperative fluoroscopic images of the lower lumbar spine in the AP and lateral projection demonstrate L4-L5 posterior and interbody fusion.  Chronic anterolisthesis at this level.  IMPRESSION: L4-L5 fusion under way.  Original Report Authenticated By: [name], M.D.   Dg Lumbar Spine 1 View  08/08/2011  *RADIOLOGY REPORT*  Clinical Data: 77 year old female undergoing lumbar surgery.  LUMBAR SPINE - 1 VIEW  Comparison: [name] lumbar radiographs 02/27/2011.  [facility] lumbar MRI 02/15/2011.  Findings: Portable cross-table  lateral intraoperative view of the lumbar spine labeled film #1 0920 hours.  Chronic L3 compression fracture.  Surgical probes are in place at the L5 pedicle level on this image.  IMPRESSION: Intraoperative localization as above.  Original Report Authenticated By: [name], M.D.    Assessment/Plan: The patient is doing well except her blood pressure is mildly low. I will give her a fluid bolus.  LOS: 0 days     Rogue Pautler D 08/08/2011, 2:59 PM

## 2011-08-08 NOTE — Anesthesia Preprocedure Evaluation (Addendum)
Anesthesia Evaluation  Patient identified by MRN, date of birth, ID band Patient awake and Patient confused    Reviewed: Allergy & Precautions, H&P , NPO status , Patient's Chart, lab work & pertinent test results, reviewed documented beta blocker date and time   Airway Mallampati: II TM Distance: <3 FB Neck ROM: full  Mouth opening: Limited Mouth Opening  Dental  (+) Dental Advidsory Given, Caps and Teeth Intact   Pulmonary neg pulmonary ROS, shortness of breath and with exertion,  clear to auscultation  Pulmonary exam normal       Cardiovascular + CAD and + DOE Regular Normal- Systolic murmurs    Neuro/Psych  Headaches, PSYCHIATRIC DISORDERS Anxiety    GI/Hepatic Neg liver ROS, GERD-  Medicated and Controlled,  Endo/Other  Negative Endocrine ROS  Renal/GU negative Renal ROS     Musculoskeletal   Abdominal   Peds  Hematology   Anesthesia Other Findings   Reproductive/Obstetrics                          Anesthesia Physical Anesthesia Plan  ASA: III  Anesthesia Plan: General   Post-op Pain Management:    Induction:   Airway Management Planned: Oral ETT  Additional Equipment:   Intra-op Plan:   Post-operative Plan:   Informed Consent: I have reviewed the patients History and Physical, chart, labs and discussed the procedure including the risks, benefits and alternatives for the proposed anesthesia with the patient or authorized representative who has indicated his/her understanding and acceptance.   Dental Advisory Given  Plan Discussed with: CRNA, Anesthesiologist and Surgeon  Anesthesia Plan Comments:        Anesthesia Quick Evaluation

## 2011-08-08 NOTE — Transfer of Care (Signed)
Immediate Anesthesia Transfer of Care Note  Patient: [name]  Procedure(s) Performed:  POSTERIOR LUMBAR FUSION 1 LEVEL - Lumbar four laminectomy with Lumbar four-five laminotomies and Lumbar four-five posterior lumbar interbody fusion with interbody prothesis, posterolateral arthrodesis and posterior nonsegmental instrumentation  Patient Location: PACU  Anesthesia Type: General  Level of Consciousness: awake, alert  and oriented  Airway & Oxygen Therapy: Patient Spontanous Breathing and Patient connected to nasal cannula oxygen  Post-op Assessment: Report given to PACU RN, Post -op Vital signs reviewed and stable and Patient moving all extremities X 4  Post vital signs: Reviewed and stable Filed Vitals:   08/08/11 0714  BP: 102/67  Pulse: 76  Temp: 36.3 C  Resp: 16    Complications: No apparent anesthesia complications

## 2011-08-08 NOTE — Preoperative (Signed)
Beta Blockers   Reason not to administer Beta Blockers:Not Applicable 

## 2011-08-08 NOTE — Anesthesia Procedure Notes (Signed)
Procedure Name: Intubation Date/Time: 08/08/2011 8:37 AM Performed by: Carmela Rima Pre-anesthesia Checklist: Patient identified, Timeout performed, Emergency Drugs available, Suction available and Patient being monitored Patient Re-evaluated:Patient Re-evaluated prior to inductionOxygen Delivery Method: Circle System Utilized Preoxygenation: Pre-oxygenation with 100% oxygen Intubation Type: IV induction Ventilation: Mask ventilation without difficulty Laryngoscope Size: Mac and 3 Grade View: Grade II Tube type: Oral Tube size: 7.5 mm Number of attempts: 1 Placement Confirmation: ETT inserted through vocal cords under direct vision,  breath sounds checked- equal and bilateral,  positive ETCO2 and CO2 detector Secured at: 22 cm Tube secured with: Tape Dental Injury: Teeth and Oropharynx as per pre-operative assessment

## 2011-08-08 NOTE — Progress Notes (Signed)
Envelope number [id number] placed on chart with key for belongings

## 2011-08-08 NOTE — Progress Notes (Addendum)
Pt received from PACU at approximately 1345. Arrival BP was in the 80s systolic. Per PACU nurse output in PACU was 400cc. At 1500 physician rounded on pt and was notified that the SBP was still in the 80s and the urine output for 1 hour was 25cc. A LR bolus of 500cc to be given over 2 hours was ordered. At completion of bolus at 1700 the BP still remained in 80s systolic and from 1500-1600 output was another 25cc and from 1600-1700 it was 0. Physician on call was notified and an order for NS at 1000cc was received and started at 1720. Pt is lethargic but arousable and will hold an appropriate conversation. Otherwise neuro intact. The foley was checked for patency and appears to be functioning appropriately. A bladder scan was also used to verify and showed 0cc in bladder. Will continue to monitor.

## 2011-08-08 NOTE — Progress Notes (Signed)
Subjective:  Patient is alert and pleasant. She looks well.4  Objective: Vital signs in last 24 hours: Temp:  [97.3 F (36.3 C)-98.2 F (36.8 C)] 98.2 F (36.8 C) (01/16 1145) Pulse Rate:  [76] 76  (01/16 0714) Resp:  [16] 16  (01/16 0714) BP: (102)/(67) 102/67 mmHg (01/16 0714) SpO2:  [98 %] 98 % (01/16 0714)  Intake/Output from previous day:   Intake/Output this shift: Total I/O In: 1800 [I.V.:1800] Out: 1700 [Urine:1400; Blood:300]  Physical exam the patient is alert. She is moving all 4 extremities well.  Lab Results: No results found for this basename: WBC:2,HGB:2,HCT:2,PLT:2 in the last 72 hours BMET No results found for this basename: NA:2,K:2,CL:2,CO2:2,GLUCOSE:2,BUN:2,CREATININE:2,CALCIUM:2 in the last 72 hours  Studies/Results: Dg Lumbar Spine 2-3 Views  08/08/2011  *RADIOLOGY REPORT*  Clinical Data: 77 year old female undergoing lumbar spine surgery.  LUMBAR SPINE - 2-3 VIEW  Comparison: [name] lumbar radiographs08/01/2011. [facility] lumbar MRI 02/15/2011.  Fluoroscopy time of 0.2 minutes was utilized.  Findings: Chronic L3 and T12 compression fractures with normal lumbar segmentation depicted on the comparison.  These intraoperative fluoroscopic images of the lower lumbar spine in the AP and lateral projection demonstrate L4-L5 posterior and interbody fusion.  Chronic anterolisthesis at this level.  IMPRESSION: L4-L5 fusion under way.  Original Report Authenticated By: [name], M.D.    Assessment/Plan: Patient is doing well.  LOS: 0 days     Tonye Tancredi D 08/08/2011, 12:13 PM

## 2011-08-08 NOTE — Anesthesia Postprocedure Evaluation (Signed)
Anesthesia Post Note  Patient: [name]  Procedure(s) Performed:  POSTERIOR LUMBAR FUSION 1 LEVEL - Lumbar four laminectomy with Lumbar four-five laminotomies and Lumbar four-five posterior lumbar interbody fusion with interbody prothesis, posterolateral arthrodesis and posterior nonsegmental instrumentation  Anesthesia type: general  Patient location: PACU  Post pain: Pain level controlled  Post assessment: Patient's Cardiovascular Status Stable  Last Vitals:  Filed Vitals:   08/08/11 1315  BP: 105/57  Pulse:   Temp:   Resp: 16    Post vital signs: Reviewed and stable  Level of consciousness: sedated  Complications: No apparent anesthesia complications

## 2011-08-09 LAB — BASIC METABOLIC PANEL
BUN: 10 mg/dL (ref 6–23)
CO2: 28 mEq/L (ref 19–32)
Calcium: 7.2 mg/dL — ABNORMAL LOW (ref 8.4–10.5)
Chloride: 103 mEq/L (ref 96–112)
Creatinine, Ser: 0.61 mg/dL (ref 0.50–1.10)
GFR calc Af Amer: >90 mL/min (ref >90)
GFR calc non Af Amer: 87 mL/min — ABNORMAL LOW (ref >90)
Glucose, Bld: 118 mg/dL — ABNORMAL HIGH (ref 70–99)
Potassium: 3.8 mEq/L (ref 3.5–5.1)
Sodium: 136 mEq/L (ref 135–145)

## 2011-08-09 LAB — CBC
HCT: 26.7 % — ABNORMAL LOW (ref 36.0–46.0)
Hemoglobin: 8.9 g/dL — ABNORMAL LOW (ref 12.0–15.0)
RBC: 2.87 MIL/uL — ABNORMAL LOW (ref 3.87–5.11)
WBC: 9.9 10*3/uL (ref 4.0–10.5)

## 2011-08-09 NOTE — Progress Notes (Signed)
OT Cancellation Note  Pt very lethargic and experiencing dizziness due to low blood pressure per PT report.  Will re-attempt as appropriate.    08/09/2011 Cipriano Mile OTR/L Pager [phone] Office [phone]

## 2011-08-09 NOTE — Progress Notes (Signed)
Patient ID: [name], female   DOB: [birth year], 77 y.o.   MRN: [id number] Subjective:  The patient is alert and pleasant. Her back is appropriately sore.  Objective: Vital signs in last 24 hours: Temp:  [97.8 F (36.6 C)-99.4 F (37.4 C)] 98.9 F (37.2 C) (01/17 0549) Pulse Rate:  [64-89] 86  (01/17 0549) Resp:  [12-20] 20  (01/17 0549) BP: (75-147)/(39-84) 90/52 mmHg (01/17 0557) SpO2:  [96 %-100 %] 97 % (01/17 0549) Weight:  [56.881 kg (125 lb 6.4 oz)] 56.881 kg (125 lb 6.4 oz) (01/16 1953)  Intake/Output from previous day: 01/16 0701 - 01/17 0700 In: 4290 [P.O.:400; I.V.:3840; IV Piggyback:50] Out: 2400 [Urine:2100; Blood:300] Intake/Output this shift:    Physical exam patient is alert and oriented. He is moving all 4 extremities well.  Lab Results:  [name] 08/09/11 0655  WBC 9.9  HGB 8.9*  HCT 26.7*  PLT 216   BMET  Basename 08/08/11 1850  NA 135  K 4.0  CL 101  CO2 28  GLUCOSE 137*  BUN 12  CREATININE 0.87  CALCIUM 7.8*    Studies/Results: Dg Lumbar Spine 2-3 Views  08/08/2011  *RADIOLOGY REPORT*  Clinical Data: 77 year old female undergoing lumbar spine surgery.  LUMBAR SPINE - 2-3 VIEW  Comparison: [name] lumbar radiographs08/01/2011. [name] 02/15/2011.  Fluoroscopy time of 0.2 minutes was utilized.  Findings: Chronic L3 and T12 compression fractures with normal lumbar segmentation depicted on the comparison.  These intraoperative fluoroscopic images of the lower lumbar spine in the AP and lateral projection demonstrate L4-L5 posterior and interbody fusion.  Chronic anterolisthesis at this level.  IMPRESSION: L4-L5 fusion under way.  Original Report Authenticated By: [name], M.Mckay.   Dg Lumbar Spine 1 View  08/08/2011  *RADIOLOGY REPORT*  Clinical Data: 77 year old female undergoing lumbar surgery.  LUMBAR SPINE - 1 VIEW  Comparison: [name] lumbar radiographs 02/27/2011.   [name] lumbar MRI 02/15/2011.  Findings: Portable cross-table lateral intraoperative view of the lumbar spine labeled film #1 0920 hours.  Chronic L3 compression fracture.  Surgical probes are in place at the L5 pedicle level on this image.  IMPRESSION: Intraoperative localization as above.  Original Report Authenticated By: [name], M.Mckay.    Assessment/Plan: Postop day #1: Patient is doing well .We will mobilize her with PT OT.  LOS: 1 day     [name] 08/09/2011, 7:49 AM

## 2011-08-09 NOTE — Progress Notes (Signed)
Physical Therapy Evaluation Patient Details Name: [name] MRN: [id number] DOB: 1934-08-01 Today's Date: 08/09/2011 [id number] Ev2  Problem List:  Patient Active Problem List  Diagnoses  . HYPERLIPIDEMIA  . TAKOTSUBO SYNDROME  . GERD  . DIVERTICULAR DISEASE  . OSTEOARTHRITIS, KNEE, RIGHT  . IRRITABLE BOWEL SYNDROME, HX OF    Past Medical History:  Past Medical History  Diagnosis Date  . Takotsubo syndrome   . Other and unspecified hyperlipidemia   . Esophageal reflux   . Osteoarthrosis, unspecified whether generalized or localized, lower leg   . IBS (irritable bowel syndrome)   . Diverticular disease   . Shortness of breath     when my heart is not doing right  . Anemia   . Depression   . Anxiety   . Coronary artery disease   . Blood transfusion     2005 maybe  . Headache     takes Prilosec   Past Surgical History:  Past Surgical History  Procedure Date  . Knee surgery   . Hysterotomy   . Breast reconstruction   . Implantable contact lens implantation   . Cataract extraction w/ intraocular lens  implant, bilateral   . Tonsillectomy     as child    PT Assessment/Plan/Recommendation PT Assessment Clinical Impression Statement: Patient s/p lumbar decompression and fusion due to SS presents with decreased mobility, acute pain and generalized weakness/activity intolerance limiting independence with mobility.  Also reports premorbid balance issues (would fall backwards when backing up.) Will benefit from skilled PT in acute setting to maximize independence and assist in safe d/c home likely after rehab stay at [facility]. PT Recommendation/Assessment: Patient will need skilled PT in the acute care venue PT Problem List: Decreased strength;Decreased knowledge of use of DME;Decreased activity tolerance;Decreased balance;Decreased mobility;Pain;Decreased knowledge of precautions Barriers to Discharge: Decreased caregiver support Barriers to Discharge Comments: states  spouse unable to physically help her PT Therapy Diagnosis : Difficulty walking;Generalized weakness;Acute pain PT Plan PT Frequency: Min 5X/week PT Treatment/Interventions: DME instruction;Gait training;Functional mobility training;Therapeutic activities;Patient/family education;Therapeutic exercise PT Recommendation Follow Up Recommendations: Skilled nursing facility Equipment Recommended: Defer to next venue PT Goals  Acute Rehab PT Goals PT Goal Formulation: With patient Time For Goal Achievement: 7 days Pt will Roll Supine to Right Side: with supervision;with rail PT Goal: Rolling Supine to Right Side - Progress: Goal set today Pt will go Supine/Side to Sit: with rail;with min assist PT Goal: Supine/Side to Sit - Progress: Goal set today Pt will go Sit to Supine/Side: with min assist;with rail PT Goal: Sit to Supine/Side - Progress: Goal set today Pt will go Sit to Stand: with supervision PT Goal: Sit to Stand - Progress: Goal set today Pt will go Stand to Sit: with supervision PT Goal: Stand to Sit - Progress: Goal set today Pt will Ambulate: >150 feet;with min assist;with rolling walker PT Goal: Ambulate - Progress: Goal set today  PT Evaluation Precautions/Restrictions  Precautions Precautions: Back Required Braces or Orthoses: Yes Spinal Brace: Lumbar corset;Applied in sitting position Prior Functioning  Home Living Lives With: Significant other Type of Home: House Home Layout: One level Home Access: Stairs to enter Entrance Stairs-Rails: Lawyer of Steps: 3 Home Adaptive Equipment: Walker - rolling;Straight cane Prior Function Level of Independence: Independent with basic ADLs;Independent with transfers;Requires assistive device for independence;Independent with gait Cognition Cognition Arousal/Alertness: Lethargic Overall Cognitive Status: Appears within functional limits for tasks assessed Sensation/Coordination Sensation Light  Touch: Appears Intact Extremity Assessment RLE Assessment RLE Assessment:  Exceptions to [facility] RLE AROM (degrees) Overall AROM Right Lower Extremity: Within functional limits for tasks assessed RLE Strength RLE Overall Strength: Deficits;Due to pain LLE Assessment LLE Assessment: Exceptions to WFL LLE AROM (degrees) Overall AROM Left Lower Extremity: Within functional limits for tasks assessed LLE Strength LLE Overall Strength: Deficits;Due to pain Mobility (including Balance) Bed Mobility Bed Mobility: Yes Rolling Right: 4: Min assist;With rail Rolling Right Details (indicate cue type and reason): cues for precautions and technique Right Sidelying to Sit: HOB flat;With rails;2: Max assist Right Sidelying to Sit Details (indicate cue type and reason): for getting legs over bed and lifting upper trunk Sitting - Scoot to Edge of Bed: 4: Min assist Sitting - Scoot to [place] of Bed Details (indicate cue type and reason): with cues Sit to Sidelying Right: 3: Mod assist Sit to Sidelying Right Details (indicate cue type and reason): with cues for technique and lifting legs in bed, unable to stay on side with sit to side so assisted to supine to avoid twisting Transfers Transfers: Yes Sit to Stand: 1: +2 Total assist;From bed;With upper extremity assist;From chair/3-in-1 Sit to Stand Details (indicate cue type and reason): pt=50%, slow and cues for pushing through legs, to rise from chair, pt=70% with cues for hand placement Stand to Sit: 3: Mod assist;With armrests;With upper extremity assist;To chair/3-in-1;To bed Stand to Sit Details: for controlled lowering and hand placement Stand Pivot Transfers: 1: +2 Total assist Stand Pivot Transfer Details (indicate cue type and reason): to chair with +2 hand held, pt=40%, to bed with RW and mod assist (assisted back to bed at RN request due to pt pale, c/o light headed and BP 88/54. Ambulation/Gait Ambulation/Gait: No (due to pain, decr BP)      Exercise    End of Session PT - End of Session Equipment Utilized During Treatment: Back brace Activity Tolerance: Patient limited by pain;Patient limited by fatigue Patient left: with call bell in reach;in bed General Behavior During Session: [facility] for tasks performed Cognition: [facility] for tasks performed  [facility] 08/09/2011, 10:31 AM

## 2011-08-09 NOTE — Progress Notes (Signed)
UR COMPLETED  

## 2011-08-10 MED FILL — Sodium Chloride IV Soln 0.9%: INTRAVENOUS | Qty: 1000 | Status: AC

## 2011-08-10 MED FILL — Heparin Sodium (Porcine) Inj 1000 Unit/ML: INTRAMUSCULAR | Qty: 30 | Status: AC

## 2011-08-10 NOTE — Progress Notes (Signed)
Patient is arousal and oriented to self only.  Patient drifts back to sleep during conversation.  Denies back pain at this times.  Reports throat soreness.  Chloraseptic spray administered.  Mouth care provided and vasaline applied to lips.  Patient lips are dry and mild edema noted to tongue.  2L O2 via nasal cannula, saturation is 95%.  Patient too lethargic to complete incentive spirometer at this time.  Call light in reach and bed in low position with bed alarm on.  Will continue to monitor.  Osvaldo Angst, RN-------------------

## 2011-08-10 NOTE — Progress Notes (Signed)
Physical Therapy Treatment Patient Details Name: [name] MRN: [id number] DOB: 1935/03/17 Today's Date: 08/10/2011  PT Assessment/Plan  PT - Assessment/Plan Comments on Treatment Session: Pt extremely lethargic and unresponsive throughout treatment. RN aware and discussed throughout treatment. Pt able sit at edge of bed with supervision only for ~10 minutes.  PT Plan: Discharge plan remains appropriate;Frequency remains appropriate PT Frequency: Min 5X/week Follow Up Recommendations: Skilled nursing facility Equipment Recommended: Defer to next venue PT Goals  Acute Rehab PT Goals PT Goal Formulation: With patient PT Goal: Rolling Supine to Right Side - Progress: Not progressing PT Goal: Supine/Side to Sit - Progress: Not progressing PT Goal: Sit to Supine/Side - Progress: Not progressing PT Goal: Sit to Stand - Progress: Not progressing PT Goal: Stand to Sit - Progress: Not progressing PT Goal: Ambulate - Progress: Not progressing  PT Treatment Precautions/Restrictions  Precautions Precautions: Back Required Braces or Orthoses: Yes Spinal Brace: Lumbar corset;Applied in sitting position Mobility (including Balance) Bed Mobility Bed Mobility: Yes Supine to Sit: 1: +2 Total assist;Patient percentage (comment) (40%) Supine to Sit Details (indicate cue type and reason): Pt attempting to bring bilateral LE off R side of bed. Pt resisting verbal and manual cues to roll on side for log roll technique.  +2 assist to maintain back precautions and due to pt's decreased arousal. Sit to Supine: 1: +2 Total assist;Patient percentage (comment) (40%) Sit to Supine - Details (indicate cue type and reason): +2 assist to maintain back precautions and to align body in bed.  Balance Balance Assessed: Yes Static Sitting Balance Static Sitting - Balance Support: Feet unsupported;No upper extremity supported Static Sitting - Level of Assistance: 5: Stand by assistance Static Sitting - Comment/#  of Minutes: 10  End of Session General Behavior During Session: Lethargic Cognition: Impaired Cognitive Impairment: Unable to follow one step commands  Milana Kidney 08/10/2011, 4:36 PM  08/10/2011 Milana Kidney DPT PAGER: [phone] OFFICE: [phone]

## 2011-08-10 NOTE — Progress Notes (Signed)
Occupational Therapy Evaluation Patient Details Name: [name] MRN: [id number] DOB: [birth year] Today's Date: 08/10/2011  Problem List:  Patient Active Problem List  Diagnoses  . HYPERLIPIDEMIA  . TAKOTSUBO SYNDROME  . GERD  . DIVERTICULAR DISEASE  . OSTEOARTHRITIS, KNEE, RIGHT  . IRRITABLE BOWEL SYNDROME, HX OF    Past Medical History:  Past Medical History  Diagnosis Date  . Takotsubo syndrome   . Other and unspecified hyperlipidemia   . Esophageal reflux   . Osteoarthrosis, unspecified whether generalized or localized, lower leg   . IBS (irritable bowel syndrome)   . Diverticular disease   . Shortness of breath     when my heart is not doing right  . Anemia   . Depression   . Anxiety   . Coronary artery disease   . Blood transfusion     2005 maybe  . Headache     takes Prilosec   Past Surgical History:  Past Surgical History  Procedure Date  . Knee surgery   . Hysterotomy   . Breast reconstruction   . Implantable contact lens implantation   . Cataract extraction w/ intraocular lens  implant, bilateral   . Tonsillectomy     as child    OT Assessment/Plan/Recommendation OT Assessment Clinical Impression Statement: Pt s/p POSTERIOR LUMBAR FUSION 1 LEVEL and demonstrates with decreased state or alertness and arousal possibly due to medication.  Evaluation very limited at this time due to pt's lethargic state.  Pt able to maintain static sitting balance EOB but keeps eyes closed and is unresponsive to verbal and manual cues.  Recommending SNF at this time unless pt is able to make significant progress with therapy. Pt will benefit from acute OT services to increase I with ADLs and functional transfers. OT Recommendation/Assessment: Patient will need skilled OT in the acute care venue OT Problem List: Decreased activity tolerance;Decreased cognition;Decreased knowledge of precautions;Pain OT Therapy Diagnosis : Acute pain;Cognitive deficits OT Plan OT Frequency:  Min 2X/week OT Treatment/Interventions: Self-care/ADL training;DME and/or AE instruction;Therapeutic activities;Patient/family education;Cognitive remediation/compensation OT Recommendation Follow Up Recommendations: Skilled nursing facility Equipment Recommended: Defer to next venue Individuals Consulted Consulted and Agree with Results and Recommendations: Patient unable/family or caregiver not available OT Goals Acute Rehab OT Goals OT Goal Formulation: Patient unable to participate in goal setting Time For Goal Achievement: 2 weeks ADL Goals Pt Will Perform Grooming: with min assist;Standing at sink ADL Goal: Grooming - Progress: Not met Pt Will Transfer to Toilet: with min assist;with DME;Stand pivot transfer;3-in-1;Maintaining back safety precautions ADL Goal: Toilet Transfer - Progress: Not met Pt Will Perform Toileting - Clothing Manipulation: with min assist;Standing;Sitting on 3-in-1 or toilet ADL Goal: Toileting - Clothing Manipulation - Progress: Not met Pt Will Perform Toileting - Hygiene: with supervision;Sitting on 3-in-1 or toilet ADL Goal: Toileting - Hygiene - Progress: Not met Miscellaneous OT Goals Miscellaneous OT Goal #1: Pt will perform bed mobility with min A in prep for EOB ADLs. OT Goal: Miscellaneous Goal #1 - Progress: Not met  OT Evaluation Precautions/Restrictions  Precautions Precautions: Back Required Braces or Orthoses: Yes Spinal Brace: Lumbar corset;Applied in sitting position Prior Functioning Home Living Lives With: Significant other Type of Home: House Home Layout: One level Home Access: Stairs to enter Entrance Stairs-Rails: Lawyer of Steps: 3 Bathroom Shower/Tub: Psychologist, counselling;Tub/shower unit Bathroom Toilet: Standard Bathroom Accessibility: Yes How Accessible: Accessible via walker Home Adaptive Equipment: Walker - rolling;Straight cane Prior Function Level of Independence: Independent with basic  ADLs;Independent with transfers;Requires  assistive device for independence;Independent with gait ADL ADL Eating/Feeding: Performed;Maximal assistance Eating/Feeding Details (indicate cue type and reason): Pt reported being thirsty, required max assist to hold cup due to lethargic state. Where Assessed - Eating/Feeding: Bed level Grooming: Performed;+1 Total assistance;Wash/dry face Grooming Details (indicate cue type and reason): Pt sitting EOB with supervision and eyes closed. Pt not responding to commands or manual cues to wash face.  Where Assessed - Grooming: Sitting, bed;Unsupported Ambulation Related to ADLs: Not attempted due to pt's lethargic  ADL Comments: Back brace donned sitting EOB with +1 total assist. Pt requiring max to total assist for ADLs due to decreased awareness and arousal. Vision/Perception    Cognition Cognition Arousal/Alertness: Lethargic Overall Cognitive Status: Difficult to assess Difficult to assess due to: level of arousal Cognition - Other Comments: RN Kyra at bedside and reports that pt has received alot of medication.  Lethargic state likely due to medication Sensation/Coordination   Extremity Assessment RUE Assessment RUE Assessment: Not tested (Unable to test due to pt's lethargic state) LUE Assessment LUE Assessment: Not tested (Unable to test due to pt's lethargic state) Mobility  Bed Mobility Bed Mobility: Yes Supine to Sit: 1: +2 Total assist;Patient percentage (comment) (40%) Supine to Sit Details (indicate cue type and reason): Pt attempting to bring bilateral LE off R side of bed. Pt resisting verbal and manual cues to roll on side for log roll technique.  +2 assist to maintain back precautions and due to pt's decreased arousal. Sit to Supine: 1: +2 Total assist;Patient percentage (comment) (40%) Sit to Supine - Details (indicate cue type and reason): +2 assist to maintain back precautions and to align body in bed. Exercises   End of  Session OT - End of Session Equipment Utilized During Treatment: Back brace Activity Tolerance: Patient limited by fatigue;Other (comment) (lethargic) Patient left: in bed;with bed alarm set;with call bell in reach Nurse Communication: Other (comment) (decreased arousal) General Behavior During Session: Lethargic Cognition: Impaired Cognitive Impairment: Unable to follow one step commands   Cipriano Mile 08/10/2011, 1:55 PM  08/10/2011 Cipriano Mile OTR/L Pager [phone] Office [phone]

## 2011-08-10 NOTE — Progress Notes (Signed)
Physical Therapy Treatment Patient Details Name: [name] MRN: [id number] DOB: 10-Feb-1935 Today's Date: 08/10/2011  PT Assessment/Plan  PT - Assessment/Plan Comments on Treatment Session: Pt extremely lethargic and unresponsive throughout treatment. RN aware and discussed throughout treatment. Pt able sit at edge of bed with supervision only for ~10 minutes.  PT Plan: Discharge plan remains appropriate;Frequency remains appropriate PT Frequency: Min 5X/week Follow Up Recommendations: Skilled nursing facility Equipment Recommended: Defer to next venue PT Goals  Acute Rehab PT Goals PT Goal Formulation: With patient PT Goal: Rolling Supine to Right Side - Progress: Not progressing PT Goal: Supine/Side to Sit - Progress: Not progressing PT Goal: Sit to Supine/Side - Progress: Not progressing PT Goal: Sit to Stand - Progress: Not progressing PT Goal: Stand to Sit - Progress: Not progressing PT Goal: Ambulate - Progress: Not progressing  PT Treatment Precautions/Restrictions  Precautions Precautions: Back Required Braces or Orthoses: Yes Spinal Brace: Lumbar corset;Applied in sitting position Mobility (including Balance) Bed Mobility Bed Mobility: Yes Supine to Sit: 1: +2 Total assist;Patient percentage (comment) (40%) Supine to Sit Details (indicate cue type and reason): Pt attempting to bring bilateral LE off R side of bed. Pt resisting verbal and manual cues to roll on side for log roll technique.  +2 assist to maintain back precautions and due to pt's decreased arousal. Sit to Supine: 1: +2 Total assist;Patient percentage (comment) (40%) Sit to Supine - Details (indicate cue type and reason): +2 assist to maintain back precautions and to align body in bed.  Balance Balance Assessed: Yes Static Sitting Balance Static Sitting - Balance Support: Feet unsupported;No upper extremity supported Static Sitting - Level of Assistance: 5: Stand by assistance Static Sitting - Comment/#  of Minutes: 10 Exercise    End of Session General Behavior During Session: Lethargic Cognition: Impaired Cognitive Impairment: Unable to follow one step commands  Milana Kidney 08/10/2011, 4:34 PM  08/10/2011 Milana Kidney DPT PAGER: [phone] OFFICE: [phone]

## 2011-08-10 NOTE — Progress Notes (Signed)
Patient ID: [name], female   DOB: Jun 26, 1935, 77 y.o.   MRN: [id number] Subjective:  Patient is alert and pleasant.  Objective: Vital signs in last 24 hours: Temp:  [97.8 F (36.6 C)-99.9 F (37.7 C)] 98.1 F (36.7 C) (01/18 0620) Pulse Rate:  [94-126] 120  (01/18 0620) Resp:  [16-22] 20  (01/18 0620) BP: (88-129)/(43-70) 129/70 mmHg (01/18 0620) SpO2:  [90 %-98 %] 92 % (01/18 0620)  Intake/Output from previous day: 01/17 0701 - 01/18 0700 In: 230 [P.O.:230] Out: 1330 [Urine:1330] Intake/Output this shift:    Physical exam the patient is alert and oriented. She has grossly normal lower extremity motor strength bilaterally.  Lab Results:  [facility] 08/09/11 0655  WBC 9.9  HGB 8.9*  HCT 26.7*  PLT 216   BMET  Basename 08/09/11 0655 08/08/11 1850  NA 136 135  K 3.8 4.0  CL 103 101  CO2 28 28  GLUCOSE 118* 137*  BUN 10 12  CREATININE 0.61 0.87  CALCIUM 7.2* 7.8*    Studies/Results: Dg Lumbar Spine 2-3 Views  08/08/2011  *RADIOLOGY REPORT*  Clinical Data: 77 year old female undergoing lumbar spine surgery.  LUMBAR SPINE - 2-3 VIEW  Comparison: [name] lumbar radiographs08/01/2011.  lumbar MRI 02/15/2011.  Fluoroscopy time of 0.2 minutes was utilized.  Findings: Chronic L3 and T12 compression fractures with normal lumbar segmentation depicted on the comparison.  These intraoperative fluoroscopic images of the lower lumbar spine in the AP and lateral projection demonstrate L4-L5 posterior and interbody fusion.  Chronic anterolisthesis at this level.  IMPRESSION: L4-L5 fusion under way.  Original Report Authenticated By: [name], M.D.   Dg Lumbar Spine 1 View  08/08/2011  *RADIOLOGY REPORT*  Clinical Data: 77 year old female undergoing lumbar surgery.  LUMBAR SPINE - 1 VIEW  Comparison: [name] lumbar radiographs 02/27/2011.  [facility] lumbar MRI 02/15/2011.  Findings: Portable  cross-table lateral intraoperative view of the lumbar spine labeled film #1 0920 hours.  Chronic L3 compression fracture.  Surgical probes are in place at the L5 pedicle level on this image.  IMPRESSION: Intraoperative localization as above.  Original Report Authenticated By: [name], M.D.    Assessment/Plan: Postop day #2: We will continue to mobilize the patient with PT and OT. She will likely go home on Saturday or Sunday.  LOS: 2 days     Jamel Holzmann D 08/10/2011, 7:58 AM

## 2011-08-10 NOTE — Clinical Documentation Improvement (Signed)
Anemia Blood Loss Clarification  THIS DOCUMENT IS NOT A PERMANENT PART OF THE MEDICAL RECORD  RESPOND TO THE THIS QUERY, FOLLOW THE INSTRUCTIONS BELOW:  1. If needed, update documentation for the patient's encounter via the notes activity.  2. Access this query again and click edit on the In [employer].  3. After updating, or not, click F2 to complete all highlighted (required) fields concerning your review. Select "additional documentation in the medical record" OR "no additional documentation provided".  4. Click Sign note button.  5. The deficiency will fall out of your In Basket *Please let us know if you are not able to complete this workflow by phone or e-mail (listed below).        08/10/11  Dear Dr.Jenkins Marton Redwood  In an effort to better capture your patient's severity of illness, reflect appropriate length of stay and utilization of resources, a review of the patient medical record has revealed the following indicators.   Based on your clinical judgment, please clarify and document in a progress note and/or discharge summary the clinical condition associated with the following supporting information: In responding to this query please exercise your independent judgment.  The fact that a query is asked, does not imply that any particular answer is desired or expected.   Please consider the below (if your clinical findings/judgment agree) as you document the patient's diagnosis/condition(s) in the progress note and discharge summary. Thank you!  Possible Clinical Conditions?  - Expected Acute Blood Loss Anemia  - Acute Blood Loss Anemia  - Other condition (please document in the progress notes and/or discharge summary)  - Cannot Clinically determine at this time    Supporting Information:  - Recent surgery  - EBL=  -  Component HGB HCT  Latest Ref Rng 12.0 - 15.0 g/dL 16.1 - 09.6 %  0/10/5407 12.7 35.9 (L)  08/09/2011 8.9 (L) 26.7 (L)   - Heart Rates  (1/17 & 1/18) = 107, 120, 126, 97, 102, 100, 94, 105 ....  - Blood Pressure (1/17) = 88/43, 105/64, 105/64, 92/52, 93/61, 100/50, 88/54, 75/39 ...  - "The patient is doing well except her blood pressure is mildly low. I will give her a fluid bolus" progress note 1/16  - "Labs look good. No evidence acute renal failure. Will increase IV maintenance to 80cc/hr. Foley fully interrogated, it is functioning." progress note 1/16   No additional documentation in chart upon review. SM   Thank You,  Saul Fordyce  Clinical Documentation Specialist: [phone] Pager  Please feel free to page me with any question regarding the query, including how to respond to the query.  Health Information Management [place]

## 2011-08-10 NOTE — Progress Notes (Signed)
Patient transferred to chair with max assist.  Patient lethargic but arouseable.  Lumbar brace applied by staff and front wheel walker utilized for transfer.  Patient needed extensive cues and reminders.  Patient has generalized weakness.  Patient's appetite is minimal.  Mouth care provided with biotene swabs.  No further increase in tongue edema throughout shift.  Daughter at bedside this afternoon.  Provided update and she stated "Has Momma gotten her Xanax today?  I think that is what is wrong she really needs that xanax to help her relax".   Patient denies pain at this time.  Will continue to monitor.  Osvaldo Angst, RN-------------------

## 2011-08-11 LAB — BASIC METABOLIC PANEL
BUN: 8 mg/dL (ref 6–23)
Calcium: 8.4 mg/dL (ref 8.4–10.5)
Creatinine, Ser: 0.46 mg/dL — ABNORMAL LOW (ref 0.50–1.10)
GFR calc Af Amer: >90 mL/min (ref >90)
GFR calc non Af Amer: >90 mL/min (ref >90)

## 2011-08-11 LAB — CBC
HCT: 26.3 % — ABNORMAL LOW (ref 36.0–46.0)
MCH: 31.1 pg (ref 26.0–34.0)
MCHC: 33.8 g/dL (ref 30.0–36.0)
MCV: 92 fL (ref 78.0–100.0)
Platelets: 210 10*3/uL (ref 150–400)
RDW: 12.1 % (ref 11.5–15.5)

## 2011-08-11 NOTE — Progress Notes (Signed)
Physical Therapy Treatment Patient Details Name: [name] MRN: [id number] DOB: 1934/09/03 Today's Date: 08/11/2011  PT Assessment/Plan  PT - Assessment/Plan Comments on Treatment Session: progressed with gait today. still needs cues for safety with all aspects of mobiltiy. PT Plan: Discharge plan remains appropriate;Frequency remains appropriate PT Frequency: Min 5X/week Follow Up Recommendations: Skilled nursing facility Equipment Recommended: Defer to next venue PT Goals  Acute Rehab PT Goals PT Goal: Rolling Supine to Right Side - Progress: Progressing toward goal PT Goal: Supine/Side to Sit - Progress: Progressing toward goal PT Goal: Sit to Stand - Progress: Progressing toward goal PT Goal: Stand to Sit - Progress: Progressing toward goal PT Goal: Ambulate - Progress: Progressing toward goal  PT Treatment Precautions/Restrictions  Precautions Precautions: Back Precaution Comments: pt unable to recall any back precautions. Reeducated pt on 3/3 back precautions throughout session. Required Braces or Orthoses: Yes Spinal Brace: Lumbar corset;Applied in sitting position (mod assist with donning brace seated EOB.) Restrictions Weight Bearing Restrictions: No Mobility (including Balance) Bed Mobility Rolling Right: 4: Min assist;With rail Rolling Right Details (indicate cue type and reason): cues for logroll technique to adhere to back precautions. assist needed with trunk transitions. Right Sidelying to Sit: 4: Min assist;With rails;HOB flat Right Sidelying to Sit Details (indicate cue type and reason): cues to bring legs off edge of bed and to use arms to elevate trunk into sitting. assist needed with trunk transitions. Sitting - Scoot to Edge of Bed: 5: Supervision Sitting - Scoot to [place] of Bed Details (indicate cue type and reason): cues to use arms to scoot to edge of bed. Transfers Sit to Stand: 4: Min assist;From bed;With upper extremity assist Sit to Stand Details  (indicate cue type and reason): cues for hand placement, ant wt shifting to achieve standing. cues/assist for upright posture (hip/trunk ext). Stand to Sit: 4: Min assist;To chair/3-in-1;With upper extremity assist Stand to Sit Details: assist needed to control descent into chair along with cues for hand placement (to reach back for chair). Ambulation/Gait Ambulation/Gait: Yes Ambulation/Gait Assistance: 4: Min assist Ambulation/Gait Assistance Details (indicate cue type and reason): assist for balance needed. cues for posture and to increase BOS with gait. cues for walker use/safety. Ambulation Distance (Feet): 110 Feet Assistive device: Rolling walker Gait Pattern: Step-through pattern;Decreased stride length;Decreased step length - right;Decreased step length - left;Trunk flexed  Posture/Postural Control Posture/Postural Control: No significant limitations Exercise    End of Session PT - End of Session Equipment Utilized During Treatment: Gait belt;Back brace Activity Tolerance: Patient tolerated treatment well;Patient limited by pain Patient left: in chair;with call bell in reach;with family/visitor present;Other (comment) (chair alarm on) Nurse Communication: Mobility status for transfers;Mobility status for ambulation General Behavior During Session: Other (comment) (pt drowsy, awakens easily with mobility today.) Cognition: Impaired (decreased memory and safety awareness today.) Cognitive Impairment: followed one-step commands today. needed increased time to process information.  Sallyanne Kuster 08/11/2011, 2:00 PM  Sallyanne Kuster, PTA Office- [phone]

## 2011-08-11 NOTE — Progress Notes (Signed)
Doing well. C/o appropriate incisional soreness. Slightly confused - better today No Numbness, tingling, weakness No Nausea /vomiting Only taken few steps with PT  Temp:  [97.2 F (36.2 C)-98.8 F (37.1 C)] 98.3 F (36.8 C) (01/19 0600) Pulse Rate:  [90-107] 92  (01/19 0600) Resp:  [16-20] 16  (01/19 0600) BP: (92-152)/(55-76) 152/76 mmHg (01/19 0600) SpO2:  [90 %-98 %] 90 % (01/19 0600) Good strength and sensation Incision CDI  Plan: Need to increase activity,

## 2011-08-12 NOTE — Progress Notes (Signed)
Patient clearing moderate amount of phlegm from throat, red and dark yellow in color.  Assisted patient with mouth care.  Patient stated "Last surgery I had really messed up my throat, I had to go back to the doctor."  Patient is afebrile.  Will continue to moitor.  Osvaldo Angst, RN---------------------------

## 2011-08-12 NOTE — Progress Notes (Signed)
Doing well. C/o appropriate incisional soreness.  No Numbness, tingling, weakness No Nausea /vomiting Amb/ voiding well   Temp:  [97.3 F (36.3 C)-98.7 F (37.1 C)] 98.7 F (37.1 C) (01/20 0600) Pulse Rate:  [88-96] 88  (01/20 0600) Resp:  [15-19] 19  (01/20 0600) BP: (114-127)/(68-80) 125/80 mmHg (01/20 0600) SpO2:  [93 %-95 %] 95 % (01/20 0600) Good strength and sensation Incision CDI Less confusion Plan: CPM - increase activity - d/c soon

## 2011-08-12 NOTE — Progress Notes (Signed)
Patient OOB to chair X3 today.  Patient ambulating in hallway with walker and brace, tolerated well.  One assist for transfers and to bathroom for back precaution reminders.  Will continue to monitor.  Osvaldo Angst, RN------------------

## 2011-08-13 NOTE — Progress Notes (Signed)
Patient ID: [name], female   DOB: 1935-01-18, 77 y.o.   MRN: [id number] Subjective:  This is a note for one I saw the patient this morning at 07 30. The patient is alert and pleasant. She is awaiting rehabilitation versus skilled nurse nursing facility placement versus home health care PT OT.  Objective: Vital signs in last 24 hours: Temp:  [97.8 F (36.6 C)-98.4 F (36.9 C)] 98.1 F (36.7 C) (01/21 1456) Pulse Rate:  [72-85] 84  (01/21 1456) Resp:  [18] 18  (01/21 1456) BP: (116-159)/(71-78) 155/76 mmHg (01/21 1456) SpO2:  [95 %-98 %] 96 % (01/21 1456)  Intake/Output from previous day: 01/20 0701 - 01/21 0700 In: -  Out: 1 [Urine:1] Intake/Output this shift:    Physical exam the patient is alert and oriented. He is moving all 4 extremities well.  Lab Results:  [facility] 08/11/11 0955  WBC 11.3*  HGB 8.9*  HCT 26.3*  PLT 210   BMET  Basename 08/11/11 0955  NA 134*  K 3.5  CL 98  CO2 28  GLUCOSE 130*  BUN 8  CREATININE 0.46*  CALCIUM 8.4    Studies/Results: No results found.  Assessment/Plan: Postop day #5: The patient is doing well. I am awaiting recommendations from rehabilitation PT OT as above.  LOS: 5 days     Michelangelo Rindfleisch D 08/13/2011, 5:00 PM

## 2011-08-13 NOTE — Progress Notes (Signed)
Physical Therapy Treatment Patient Details Name: [name] MRN: [id number] DOB: [birth year] Today's Date: 08/13/2011  PT Assessment/Plan  PT - Assessment/Plan Comments on Treatment Session: Pt able to increase ambulation distance and more alert this session.  Pt did c/o 7/10 back pain with mobility however just received pain meds per pt. PT Plan: Discharge plan remains appropriate;Frequency remains appropriate PT Frequency: Min 5X/week Follow Up Recommendations: Skilled nursing facility Equipment Recommended: Defer to next venue PT Goals  Acute Rehab PT Goals PT Goal Formulation: With patient Time For Goal Achievement: 7 days Pt will Roll Supine to Right Side: with supervision;with rail PT Goal: Rolling Supine to Right Side - Progress: Progressing toward goal Pt will go Supine/Side to Sit: with rail;with min assist PT Goal: Supine/Side to Sit - Progress: Progressing toward goal Pt will go Sit to Stand: with supervision PT Goal: Sit to Stand - Progress: Progressing toward goal Pt will go Stand to Sit: with supervision PT Goal: Stand to Sit - Progress: Progressing toward goal Pt will Ambulate: >150 feet;with min assist;with rolling walker PT Goal: Ambulate - Progress: Met  PT Treatment Precautions/Restrictions  Precautions Precautions: Back;Fall Precaution Comments: pt unable to recall any back precautions. Reeducated pt on 3/3 back precautions throughout session. Required Braces or Orthoses: Yes Spinal Brace: Applied in sitting position;Lumbar corset Restrictions Weight Bearing Restrictions: No Mobility (including Balance) Bed Mobility Bed Mobility: Yes Rolling Right: 4: Min assist Rolling Right Details (indicate cue type and reason): (A) to complete roll and prevent twisting with rolling Right Sidelying to Sit: 4: Min assist Right Sidelying to Sit Details (indicate cue type and reason): (A) to elevate trunk OOB with cues for technique and hand placement. Supine to Sit: 4:  Min assist Transfers Transfers: Yes Sit to Stand: 4: Min assist;With upper extremity assist Sit to Stand Details (indicate cue type and reason): (A) to initiate transfer with cues for hand placement.  Pt continues to keep hands on RW during transfers. Stand to Sit: 4: Min assist;To chair/3-in-1 Stand to Sit Details: Minguard (A) for safety with cues for hand placement. Ambulation/Gait Ambulation/Gait: Yes Ambulation/Gait Assistance: 4: Min assist Ambulation/Gait Assistance Details (indicate cue type and reason): minguard for safety with max cues to stand upright with cues for RW placement and body position within RW. Ambulation Distance (Feet): 200 Feet Assistive device: Rolling walker Gait Pattern: Step-through pattern;Decreased stride length;Decreased step length - right;Decreased step length - left;Trunk flexed Gait velocity: encouraged increase gait speed Stairs: No Wheelchair Mobility Wheelchair Mobility: No  Posture/Postural Control Posture/Postural Control: No significant limitations Balance Balance Assessed: Yes Static Sitting Balance Static Sitting - Balance Support: Feet unsupported;No upper extremity supported Static Sitting - Level of Assistance: 5: Stand by assistance Static Sitting - Comment/# of Minutes: ~ to donn brace with min (A) for proper position.  Pt attempted to place brace too high. Exercise    End of Session PT - End of Session Equipment Utilized During Treatment: Gait belt;Back brace Activity Tolerance: Patient tolerated treatment well;Patient limited by pain Patient left: in chair;with call bell in reach;with family/visitor present;Other (comment) Nurse Communication: Mobility status for transfers;Mobility status for ambulation General Behavior During Session: [facility] for tasks performed Cognition: [facility] for tasks performed  Anali Cabanilla 08/13/2011, 2:25 PM [phone]

## 2011-08-13 NOTE — Progress Notes (Signed)
Occupational Therapy Treatment Patient Details Name: NATAKI MCCRUMB MRN: [id number] DOB: 12/20/1934 Today's Date: 08/13/2011  6/10 pain RN notified, repositioned, ice applied to back. 8:15-8:50  OT Assessment/Plan OT Assessment/Plan Comments on Treatment Session: Pt progressing well towards LTG; some of the LGT were upgraded due to progress. OT Plan: Discharge plan remains appropriate;Frequency remains appropriate OT Frequency: Min 2X/week Follow Up Recommendations: Home health OT OT Goals ADL Goals Pt Will Perform Grooming: with modified independence;Standing at sink ADL Goal: Grooming - Progress: Updated due to goal met Pt Will Transfer to Toilet: Maintaining back safety precautions;Comfort height toilet;3-in-1;with modified independence ADL Goal: Toilet Transfer - Progress: Updated due to goal met Pt Will Perform Toileting - Clothing Manipulation: with modified independence;Standing ADL Goal: Toileting - Clothing Manipulation - Progress: Updated due to goal met Pt Will Perform Toileting - Hygiene: with modified independence;Sitting on 3-in-1 or toilet ADL Goal: Toileting - Hygiene - Progress: Updated due to goal met Miscellaneous OT Goals Miscellaneous OT Goal #1: Pt will perform bed mobility with supervision and don brace in prep for ADLs OT Goal: Miscellaneous Goal #1 - Progress: Updated due to goal met  OT Treatment Precautions/Restrictions  Precautions Precautions: Back;Fall Required Braces or Orthoses: Yes Spinal Brace: Applied in sitting position;Lumbar corset (min A to don with extra time) Restrictions Weight Bearing Restrictions: No   ADL ADL Eating/Feeding: Performed;Set up Where Assessed - Eating/Feeding: Chair Grooming: Performed;Wash/dry hands;Teeth care;Brushing hair;Supervision/safety Where Assessed - Grooming: Standing at sink Upper Body Dressing: Performed;Set up Upper Body Dressing Details (indicate cue type and reason): donned new gown/dress from  home Where Assessed - Upper Body Dressing: Sit to stand from bed Lower Body Dressing: Minimal assistance Lower Body Dressing Details (indicate cue type and reason): donned underwear sit to stand Where Assessed - Lower Body Dressing: Sit to stand from chair Toilet Transfer: Minimal assistance Toilet Transfer Method: Ambulating Toilet Transfer Equipment: Grab bars;Raised toilet seat without arms Toileting - Clothing Manipulation: Performed;Minimal assistance Where Assessed - Toileting Clothing Manipulation: Sit to stand from 3-in-1 or toilet Toileting - Hygiene: Supervision/safety Where Assessed - Toileting Hygiene: Sit on 3-in-1 or toilet ADL Comments: Pt performed ADLs with decreased assistance and reported her husband would Assist her prn at home with her basic ADLs. Pt with increased arousal and awareness during session. Mobility  Bed Mobility Bed Mobility: Yes Rolling Right: 4: Min assist Rolling Right Details (indicate cue type and reason): without the rail Right Sidelying to Sit: 4: Min assist Supine to Sit: 4: Min assist Transfers Transfers: Yes Sit to Stand: 4: Min assist Stand to Sit: 5: Supervision  End of Session OT - End of Session Equipment Utilized During Treatment: Back brace (RW) Activity Tolerance: Patient tolerated treatment well Patient left: in chair;with call bell in reach;Other (comment) (chair alarm set) Nurse Communication: Mobility status for ambulation;Mobility status for transfers General Behavior During Session: [facility] for tasks performed Cognition: [facility] for tasks performed  Melonie [place]  08/13/2011, 11:01 AM

## 2011-08-14 MED ORDER — CYCLOBENZAPRINE HCL 10 MG PO TABS: 5.0000 mg | ORAL_TABLET | Freq: Three times a day (TID) | ORAL | Status: AC | PRN

## 2011-08-14 MED ORDER — HYDROCODONE-ACETAMINOPHEN 5-325 MG PO TABS: 1.0000 | ORAL_TABLET | ORAL | Status: AC | PRN

## 2011-08-14 MED ORDER — DSS 100 MG PO CAPS: 100.0000 mg | ORAL_CAPSULE | Freq: Two times a day (BID) | ORAL | Status: AC

## 2011-08-14 NOTE — Discharge Summary (Signed)
Physician Discharge Summary  Patient ID: [name] MRN: [id number] DOB/AGE: [id number] y.o.  Admit date: 08/08/2011 Discharge date: 08/14/2011  Admission Diagnoses:  Discharge Diagnoses:  Active Problems:  * No active hospital problems. *    Discharged Condition: good  Hospital Course: I admitted the patient to [facility] on 08/08/2011. On that day I performed an L4-5 decompression instrumentation and fusion. The surgery went well. The patient's postop course was unremarkable. The patient was requesting discharge her home. She was discharged home on 08/14/2011.  Consults: None Significant Diagnostic Studies: None Treatments: L4-5 decompression instrumentation and fusion Discharge Exam: Blood pressure 125/70, pulse 80, temperature 98.6 F (37 C), temperature source Oral, resp. rate 16, height 5\' 4"  (1.626 m), weight 56.881 kg (125 lb 6.4 oz), SpO2 97.00%. The patient is alert and pleasant. She looks well. Her strength is normal in her lower extremities. Her dressing is clean and dry.  Disposition: Home  Discharge Orders    Future Orders Please Complete By Expires   Diet - low sodium heart healthy      Increase activity slowly      Discharge instructions      Comments:   The patient was given oral and written discharge instructions. All her questions were answered. She was instructed to call [phone] for a followup appointment.   No dressing needed      Call MD for:  temperature >100.4      Call MD for:  persistant nausea and vomiting      Call MD for:  severe uncontrolled pain      Call MD for:  redness, tenderness, or signs of infection (pain, swelling, redness, odor or green/yellow discharge around incision site)      Call MD for:  difficulty breathing, headache or visual disturbances      Call MD for:  hives      Call MD for:  persistant dizziness or light-headedness      Call MD for:  extreme fatigue        Medication List  As of 08/14/2011  7:31 AM     STOP taking these medications         acetaminophen 500 MG tablet      amoxicillin 500 MG tablet      meloxicam 7.5 MG tablet      Vitamin D 2000 UNITS Caps         TAKE these medications         ALPRAZolam 0.5 MG tablet   Commonly known as: XANAX   Take 0.5 mg by mouth 3 (three) times daily as needed. For anxiety.      aspirin 81 MG tablet   Take 81 mg by mouth daily.      calcium-vitamin D 500-200 MG-UNIT per tablet   Commonly known as: OSCAL WITH D   Take 1 tablet by mouth daily.      COREG 6.25 MG tablet   Generic drug: carvedilol   Take 6.25 mg by mouth 2 (two) times daily with a meal.      cyclobenzaprine 10 MG tablet   Commonly known as: FLEXERIL   Take 0.5 tablets (5 mg total) by mouth 3 (three) times daily as needed for muscle spasms.      DSS 100 MG Caps   Take 100 mg by mouth 2 (two) times daily.      HYDROcodone-acetaminophen 5-325 MG per tablet   Commonly known as: NORCO   Take 1-2 tablets by  mouth every 4 (four) hours as needed.      LEXAPRO 20 MG tablet   Generic drug: escitalopram   Take 20 mg by mouth 2 (two) times daily.      multivitamins ther. w/minerals Tabs   Take 1 tablet by mouth daily.      NITROSTAT 0.4 MG SL tablet   Generic drug: nitroGLYCERIN   Place 0.4 mg under the tongue every 5 (five) minutes as needed. For chest pain.      omeprazole 20 MG capsule   Commonly known as: PRILOSEC   Take 20 mg by mouth daily.      vitamin B-12 500 MCG tablet   Commonly known as: CYANOCOBALAMIN   Take 500 mcg by mouth daily.      Vitamin D3 2000 UNITS Tabs   Take 1 capsule by mouth daily.             SignedCristi Loron 08/14/2011, 7:31 AM

## 2011-08-14 NOTE — Progress Notes (Signed)
Clinical Social Worker received a consult for "SNF." PT is recommending home health with supervision. CSW met with pt to address consult. CSW introduced herself and explained role of social work. CSW completed Brief Psychosocial Assessment, which can be found in pt's chart. Pt lives with her husband, and her daughters live across the street and near by. Pt shared that she has very supportive daughters who will be providing care. Pt is discharging home today, 08/14/11. CSW handed case off to [facility] for further assisstance with home health services. CSW is signing off as no further clinical social work needs have been identified.   Dede Query, MSW, Theresia Majors [phone]

## 2011-08-14 NOTE — Progress Notes (Signed)
Utilization review completed. Rada Zegers, RN, BSN. 08/14/11 

## 2011-08-14 NOTE — Progress Notes (Signed)
CARE MANAGEMENT NOTE 08/14/2011 Patient has refused SNF, plans to go home with family. Choice offered. Pt selected [facility]. Entered in TLC.

## 2011-08-14 NOTE — Progress Notes (Signed)
Physical Therapy Treatment Patient Details Name: [name] MRN: [id number] DOB: 19-Aug-1934 Today's Date: 08/14/2011  PT Assessment/Plan  PT - Assessment/Plan Comments on Treatment Session: Pt showing much improvement therefore will update d/c plan to HHPT with 24 supervision.  Educated again on back precautions and handout given PT Plan: Discharge plan needs to be updated;Frequency remains appropriate PT Frequency: Min 5X/week Follow Up Recommendations: Home health PT;Supervision for mobility/OOB Equipment Recommended: None recommended by PT PT Goals  Acute Rehab PT Goals PT Goal Formulation: With patient Time For Goal Achievement: 7 days Pt will Roll Supine to Right Side: with supervision;with rail PT Goal: Rolling Supine to Right Side - Progress: Partly met Pt will go Supine/Side to Sit: with rail;with min assist PT Goal: Supine/Side to Sit - Progress: Met Pt will go Sit to Supine/Side: with min assist;with rail PT Goal: Sit to Supine/Side - Progress: Met Pt will go Sit to Stand: with supervision PT Goal: Sit to Stand - Progress: Met Pt will go Stand to Sit: with supervision PT Goal: Stand to Sit - Progress: Met Pt will Ambulate: >150 feet;with min assist;with rolling walker PT Goal: Ambulate - Progress: Met  PT Treatment Precautions/Restrictions  Precautions Precautions: Back;Fall Precaution Comments: pt unable to recall any back precautions. Reeducated pt on 3/3 back precautions throughout session. Required Braces or Orthoses: Yes Spinal Brace: Applied in sitting position;Lumbar corset Restrictions Weight Bearing Restrictions: No Mobility (including Balance) Bed Mobility Bed Mobility: Yes Rolling Right: 5: Supervision Rolling Right Details (indicate cue type and reason): VCs for hand placement Right Sidelying to Sit: 5: Supervision;HOB flat Right Sidelying to Sit Details (indicate cue type and reason): Supervison for safety with cues for hand placement Sit to  Sidelying Right: 4: Min assist;HOB flat Sit to Sidelying Right Details (indicate cue type and reason): (A) with LE into bed Transfers Transfers: Yes Sit to Stand: 5: Supervision;From chair/3-in-1;From bed Sit to Stand Details (indicate cue type and reason): VCs for hand placement Stand to Sit: 5: Supervision;To bed;To chair/3-in-1 Stand to Sit Details: VCs for hand placement Ambulation/Gait Ambulation/Gait: Yes Ambulation/Gait Assistance: 5: Supervision Ambulation/Gait Assistance Details (indicate cue type and reason): Supervision for safety and RW placement Ambulation Distance (Feet): 150 Feet Assistive device: Rolling walker Gait Pattern: Step-through pattern;Antalgic Stairs: No Wheelchair Mobility Wheelchair Mobility: No  Posture/Postural Control Posture/Postural Control: No significant limitations Balance Balance Assessed: Yes Static Sitting Balance Static Sitting - Balance Support: Feet unsupported;No upper extremity supported Static Sitting - Level of Assistance: 6: Modified independent (Device/Increase time) Static Sitting - Comment/# of Minutes: ~ 5 minutes to donn brace with supervision. Static Standing Balance Static Standing - Balance Support: No upper extremity supported Static Standing - Level of Assistance: 5: Stand by assistance Static Standing - Comment/# of Minutes: ~3 minutes to perform ADL task Exercise    End of Session PT - End of Session Equipment Utilized During Treatment: Gait belt;Back brace Activity Tolerance: Patient tolerated treatment well Patient left: in chair;with call bell in reach Nurse Communication: Mobility status for ambulation General Behavior During Session: [facility] for tasks performed Cognition: [facility] for tasks performed  Dontell Mian 08/14/2011, 9:28 AM [phone]

## 2011-08-15 NOTE — Progress Notes (Signed)
Pt. Was discharged in stable condition home with HHPTOT. All discharge instructions, medications and prescriptions were reviewed with pt. Pt left with all belongings.

## 2012-06-24 ENCOUNTER — Other Ambulatory Visit: Payer: Self-pay | Admitting: Family Medicine

## 2012-06-24 ENCOUNTER — Other Ambulatory Visit: Payer: Self-pay | Admitting: Neurosurgery

## 2012-06-24 DIAGNOSIS — Z1231 Encounter for screening mammogram for malignant neoplasm of breast: Secondary | ICD-10-CM

## 2012-06-24 DIAGNOSIS — R6889 Other general symptoms and signs: Secondary | ICD-10-CM

## 2012-07-01 ENCOUNTER — Other Ambulatory Visit: Payer: Medicare Other

## 2012-07-10 ENCOUNTER — Ambulatory Visit
Admission: RE | Admit: 2012-07-10 | Discharge: 2012-07-10 | Disposition: A | Discharge status: Home / Self Care | Payer: Medicare Other | Source: Ambulatory Visit | Attending: Family Medicine | Admitting: Family Medicine

## 2012-07-10 DIAGNOSIS — Z1231 Encounter for screening mammogram for malignant neoplasm of breast: Secondary | ICD-10-CM

## 2012-07-15 ENCOUNTER — Ambulatory Visit
Admission: RE | Admit: 2012-07-15 | Discharge: 2012-07-15 | Disposition: A | Discharge status: Home / Self Care | Payer: Medicare Other | Source: Ambulatory Visit | Attending: Neurosurgery | Admitting: Neurosurgery

## 2012-07-15 DIAGNOSIS — R6889 Other general symptoms and signs: Secondary | ICD-10-CM

## 2013-03-16 ENCOUNTER — Other Ambulatory Visit: Payer: Self-pay | Admitting: Family Medicine

## 2013-03-16 DIAGNOSIS — N644 Mastodynia: Secondary | ICD-10-CM

## 2013-04-22 ENCOUNTER — Encounter: Payer: Self-pay | Admitting: Cardiovascular Disease

## 2013-04-22 ENCOUNTER — Ambulatory Visit (INDEPENDENT_AMBULATORY_CARE_PROVIDER_SITE_OTHER): Payer: Medicare Other | Admitting: Cardiovascular Disease

## 2013-04-22 VITALS — BP 150/70 | HR 71 | Wt 112.0 lb

## 2013-04-22 DIAGNOSIS — I5181 Takotsubo syndrome: Secondary | ICD-10-CM

## 2013-04-22 DIAGNOSIS — E785 Hyperlipidemia, unspecified: Secondary | ICD-10-CM

## 2013-04-22 MED ORDER — NITROGLYCERIN 0.4 MG SL SUBL: 0.4000 mg | SUBLINGUAL_TABLET | SUBLINGUAL | Status: AC | PRN

## 2013-04-22 NOTE — Progress Notes (Signed)
HPI:  78 year old woman presenting for followup evaluation. She has a history of Takotsubo's cardiomyopathy. Her acute event occurred in 2007. A followup echocardiogram in December 2009 demonstrated normal left ventricular systolic function with an ejection fraction of 55-60% and no regional wall motion abnormalities. A nuclear scan in 2012 showed no ischemia. The gated LVEF was 82%.  The patient is having a lot of problems with depression, gait unsteadiness, anhedonia, and decreased energy. She's had occasional chest pain with no change in pattern over the last few years. She denies shortness of breath, edema, or palpitations. She has been working closely with Dr. Andrey Campanile regarding her depression. A neurology consultation has been recommended to evaluate her neurologic symptoms related to gait instability.  Outpatient Encounter Prescriptions as of 04/22/2013  Medication Sig Dispense Refill  . ALPRAZolam (XANAX) 0.5 MG tablet Take 0.5 mg by mouth 3 (three) times daily as needed. For anxiety.      Marland Kitchen aspirin 81 MG tablet Take 81 mg by mouth daily.        . calcium-vitamin D (OSCAL WITH D 500-200) 500-200 MG-UNIT per tablet Take 1 tablet by mouth daily.        . carvedilol (COREG) 6.25 MG tablet Take 6.25 mg by mouth 2 (two) times daily with a meal.        . escitalopram (LEXAPRO) 20 MG tablet Take 20 mg by mouth 2 (two) times daily.       . Multiple Vitamins-Minerals (MULTIVITAMINS THER. W/MINERALS) TABS Take 1 tablet by mouth daily.        . nitroGLYCERIN (NITROSTAT) 0.4 MG SL tablet Place 0.4 mg under the tongue every 5 (five) minutes as needed. For chest pain.      Marland Kitchen omeprazole (PRILOSEC) 20 MG capsule Take 20 mg by mouth daily.        . vitamin B-12 (CYANOCOBALAMIN) 500 MCG tablet Take 500 mcg by mouth daily.        . [DISCONTINUED] Cholecalciferol (VITAMIN D3) 2000 UNITS TABS Take 1 capsule by mouth daily.         No facility-administered encounter medications on file as of 04/22/2013.     Allergies  Allergen Reactions  . Sulfamethoxazole-Trimethoprim Hives    Past Medical History  Diagnosis Date  . Takotsubo syndrome   . Other and unspecified hyperlipidemia   . Esophageal reflux   . Osteoarthrosis, unspecified whether generalized or localized, lower leg   . IBS (irritable bowel syndrome)   . Diverticular disease   . Shortness of breath     when my heart is not doing right  . Anemia   . Depression   . Anxiety   . Coronary artery disease   . Blood transfusion     2005 maybe  . Headache(784.0)     takes Prilosec   ROS: Negative except as per HPI  Wt 50.803 kg (112 lb)  BMI 19.22 kg/m2  PHYSICAL EXAM: Pt is alert and oriented, pleasant elderly woman in NAD HEENT: normal Neck: JVP - normal, carotids 2+= without bruits Lungs: CTA bilaterally CV: RRR without murmur or gallop Abd: soft, NT, Positive BS, no hepatomegaly Ext: no C/C/E, distal pulses intact and equal Skin: warm/dry no rash  EKG:  Normal sinus rhythm, within normal limits.  ASSESSMENT AND PLAN: 1. Coronary artery disease, nonobstructive. No anginal symptoms are present. She will continue on aspirin for antiplatelet therapy.  2. History of Takotsubo's cardiomyopathy. Her LV function has normalized. She will continue on carvedilol at her  current dose. No further testing is indicated at this time.  For followup I will see her back in one year.  Tonny Bollman 04/22/2013 3:19 PM

## 2013-04-22 NOTE — Patient Instructions (Signed)
Your physician wants you to follow-up in: 1 YEAR with Dr Cooper.  You will receive a reminder letter in the mail two months in advance. If you don't receive a letter, please call our office to schedule the follow-up appointment.  Your physician recommends that you continue on your current medications as directed. Please refer to the Current Medication list given to you today.  

## 2013-04-27 ENCOUNTER — Ambulatory Visit: Payer: Medicare Other | Admitting: Cardiovascular Disease

## 2013-06-09 ENCOUNTER — Encounter (HOSPITAL_COMMUNITY): Payer: Self-pay | Admitting: Emergency Medicine

## 2013-06-09 ENCOUNTER — Emergency Department (HOSPITAL_COMMUNITY): Payer: Medicare Other

## 2013-06-09 ENCOUNTER — Inpatient Hospital Stay (HOSPITAL_COMMUNITY)
Admission: EM | Admit: 2013-06-09 | Discharge: 2013-06-22 | DRG: 469 | Disposition: A | Discharge status: Home Health | Payer: Medicare Other | Attending: Internal Medicine | Admitting: Internal Medicine

## 2013-06-09 DIAGNOSIS — IMO0002 Reserved for concepts with insufficient information to code with codable children: Secondary | ICD-10-CM

## 2013-06-09 DIAGNOSIS — I10 Essential (primary) hypertension: Secondary | ICD-10-CM | POA: Diagnosis present

## 2013-06-09 DIAGNOSIS — E860 Dehydration: Secondary | ICD-10-CM

## 2013-06-09 DIAGNOSIS — S0990XA Unspecified injury of head, initial encounter: Secondary | ICD-10-CM

## 2013-06-09 DIAGNOSIS — Z23 Encounter for immunization: Secondary | ICD-10-CM

## 2013-06-09 DIAGNOSIS — G8929 Other chronic pain: Secondary | ICD-10-CM | POA: Diagnosis present

## 2013-06-09 DIAGNOSIS — F0390 Unspecified dementia without behavioral disturbance: Secondary | ICD-10-CM | POA: Diagnosis present

## 2013-06-09 DIAGNOSIS — Z79899 Other long term (current) drug therapy: Secondary | ICD-10-CM

## 2013-06-09 DIAGNOSIS — M545 Low back pain, unspecified: Secondary | ICD-10-CM | POA: Diagnosis present

## 2013-06-09 DIAGNOSIS — S0003XA Contusion of scalp, initial encounter: Secondary | ICD-10-CM | POA: Diagnosis present

## 2013-06-09 DIAGNOSIS — I2510 Atherosclerotic heart disease of native coronary artery without angina pectoris: Secondary | ICD-10-CM | POA: Diagnosis present

## 2013-06-09 DIAGNOSIS — Z22322 Carrier or suspected carrier of Methicillin resistant Staphylococcus aureus: Secondary | ICD-10-CM

## 2013-06-09 DIAGNOSIS — E871 Hypo-osmolality and hyponatremia: Secondary | ICD-10-CM

## 2013-06-09 DIAGNOSIS — F22 Delusional disorders: Secondary | ICD-10-CM | POA: Diagnosis present

## 2013-06-09 DIAGNOSIS — R410 Disorientation, unspecified: Secondary | ICD-10-CM | POA: Diagnosis present

## 2013-06-09 DIAGNOSIS — W108XXA Fall (on) (from) other stairs and steps, initial encounter: Secondary | ICD-10-CM | POA: Diagnosis present

## 2013-06-09 DIAGNOSIS — I5181 Takotsubo syndrome: Secondary | ICD-10-CM | POA: Diagnosis present

## 2013-06-09 DIAGNOSIS — S72001S Fracture of unspecified part of neck of right femur, sequela: Secondary | ICD-10-CM

## 2013-06-09 DIAGNOSIS — Z8719 Personal history of other diseases of the digestive system: Secondary | ICD-10-CM

## 2013-06-09 DIAGNOSIS — K5730 Diverticulosis of large intestine without perforation or abscess without bleeding: Secondary | ICD-10-CM | POA: Diagnosis present

## 2013-06-09 DIAGNOSIS — S72001D Fracture of unspecified part of neck of right femur, subsequent encounter for closed fracture with routine healing: Secondary | ICD-10-CM

## 2013-06-09 DIAGNOSIS — E785 Hyperlipidemia, unspecified: Secondary | ICD-10-CM | POA: Diagnosis present

## 2013-06-09 DIAGNOSIS — S72001A Fracture of unspecified part of neck of right femur, initial encounter for closed fracture: Secondary | ICD-10-CM

## 2013-06-09 DIAGNOSIS — F411 Generalized anxiety disorder: Secondary | ICD-10-CM | POA: Diagnosis present

## 2013-06-09 DIAGNOSIS — D62 Acute posthemorrhagic anemia: Secondary | ICD-10-CM | POA: Diagnosis not present

## 2013-06-09 DIAGNOSIS — E876 Hypokalemia: Secondary | ICD-10-CM | POA: Diagnosis not present

## 2013-06-09 DIAGNOSIS — M6250 Muscle wasting and atrophy, not elsewhere classified, unspecified site: Secondary | ICD-10-CM | POA: Diagnosis present

## 2013-06-09 DIAGNOSIS — D473 Essential (hemorrhagic) thrombocythemia: Secondary | ICD-10-CM | POA: Diagnosis present

## 2013-06-09 DIAGNOSIS — M25551 Pain in right hip: Secondary | ICD-10-CM | POA: Diagnosis present

## 2013-06-09 DIAGNOSIS — Z7982 Long term (current) use of aspirin: Secondary | ICD-10-CM

## 2013-06-09 DIAGNOSIS — Z981 Arthrodesis status: Secondary | ICD-10-CM

## 2013-06-09 DIAGNOSIS — K219 Gastro-esophageal reflux disease without esophagitis: Secondary | ICD-10-CM

## 2013-06-09 DIAGNOSIS — S72009A Fracture of unspecified part of neck of unspecified femur, initial encounter for closed fracture: Principal | ICD-10-CM | POA: Diagnosis present

## 2013-06-09 DIAGNOSIS — M25559 Pain in unspecified hip: Secondary | ICD-10-CM

## 2013-06-09 DIAGNOSIS — F329 Major depressive disorder, single episode, unspecified: Secondary | ICD-10-CM | POA: Diagnosis present

## 2013-06-09 DIAGNOSIS — M1710 Unilateral primary osteoarthritis, unspecified knee: Secondary | ICD-10-CM | POA: Diagnosis present

## 2013-06-09 DIAGNOSIS — R404 Transient alteration of awareness: Secondary | ICD-10-CM | POA: Diagnosis not present

## 2013-06-09 DIAGNOSIS — N390 Urinary tract infection, site not specified: Secondary | ICD-10-CM | POA: Diagnosis present

## 2013-06-09 DIAGNOSIS — E43 Unspecified severe protein-calorie malnutrition: Secondary | ICD-10-CM | POA: Diagnosis present

## 2013-06-09 DIAGNOSIS — F3289 Other specified depressive episodes: Secondary | ICD-10-CM | POA: Diagnosis present

## 2013-06-09 DIAGNOSIS — B961 Klebsiella pneumoniae [K. pneumoniae] as the cause of diseases classified elsewhere: Secondary | ICD-10-CM | POA: Diagnosis present

## 2013-06-09 LAB — CBC WITH DIFFERENTIAL/PLATELET
Basophils Absolute: 0 10*3/uL (ref 0.0–0.1)
Basophils Relative: 0 % (ref 0–1)
Eosinophils Absolute: 0.2 10*3/uL (ref 0.0–0.7)
Eosinophils Relative: 1 % (ref 0–5)
HCT: 32.3 % — ABNORMAL LOW (ref 36.0–46.0)
Hemoglobin: 11.4 g/dL — ABNORMAL LOW (ref 12.0–15.0)
Lymphocytes Relative: 9 % — ABNORMAL LOW (ref 12–46)
Lymphs Abs: 1.2 K/uL (ref 0.7–4.0)
MCH: 30.6 pg (ref 26.0–34.0)
MCHC: 35.3 g/dL (ref 30.0–36.0)
MCV: 86.8 fL (ref 78.0–100.0)
Monocytes Absolute: 0.6 K/uL (ref 0.1–1.0)
Monocytes Relative: 4 % (ref 3–12)
Neutro Abs: 12.3 10*3/uL — ABNORMAL HIGH (ref 1.7–7.7)
Neutrophils Relative %: 86 % — ABNORMAL HIGH (ref 43–77)
Platelets: 274 10*3/uL (ref 150–400)
RBC: 3.72 MIL/uL — ABNORMAL LOW (ref 3.87–5.11)
RDW: 12 % (ref 11.5–15.5)
WBC: 14.3 10*3/uL — ABNORMAL HIGH (ref 4.0–10.5)

## 2013-06-09 LAB — URINALYSIS, ROUTINE W REFLEX MICROSCOPIC
Bilirubin Urine: NEGATIVE
Glucose, UA: NEGATIVE mg/dL
Ketones, ur: NEGATIVE mg/dL
Leukocytes, UA: NEGATIVE
Nitrite: NEGATIVE
Protein, ur: NEGATIVE mg/dL
Specific Gravity, Urine: 1.014 (ref 1.005–1.030)
Urobilinogen, UA: 0.2 mg/dL (ref 0.0–1.0)
pH: 8 (ref 5.0–8.0)

## 2013-06-09 LAB — PROTIME-INR
INR: 1.06 (ref 0.00–1.49)
Prothrombin Time: 13.6 seconds (ref 11.6–15.2)

## 2013-06-09 LAB — BASIC METABOLIC PANEL
BUN: 7 mg/dL (ref 6–23)
Calcium: 9 mg/dL (ref 8.4–10.5)
GFR calc Af Amer: >90 mL/min (ref >90)
GFR calc non Af Amer: 83 mL/min — ABNORMAL LOW (ref >90)
Glucose, Bld: 115 mg/dL — ABNORMAL HIGH (ref 70–99)
Potassium: 3.6 mEq/L (ref 3.5–5.1)

## 2013-06-09 LAB — TSH: TSH: 3.785 u[IU]/mL (ref 0.350–4.500)

## 2013-06-09 LAB — TYPE AND SCREEN
ABO/RH(D): A POS
Antibody Screen: NEGATIVE

## 2013-06-09 LAB — BASIC METABOLIC PANEL WITH GFR
CO2: 24 meq/L (ref 19–32)
Chloride: 91 meq/L — ABNORMAL LOW (ref 96–112)
Creatinine, Ser: 0.67 mg/dL (ref 0.50–1.10)
Sodium: 126 meq/L — ABNORMAL LOW (ref 135–145)

## 2013-06-09 LAB — ABO/RH: ABO/RH(D): A POS

## 2013-06-09 LAB — URINE MICROSCOPIC-ADD ON

## 2013-06-09 MED ORDER — FENTANYL CITRATE 0.05 MG/ML IJ SOLN
25.0000 ug | INTRAMUSCULAR | Status: DC | PRN
  Administered 2013-06-09 (×2): 25 ug via INTRAVENOUS
  Filled 2013-06-09 (×2): qty 2

## 2013-06-09 MED ORDER — ONDANSETRON HCL 4 MG/2ML IJ SOLN
4.0000 mg | Freq: Once | INTRAMUSCULAR | Status: AC
  Administered 2013-06-09: 4 mg via INTRAVENOUS
  Filled 2013-06-09: qty 2

## 2013-06-09 MED ORDER — ONDANSETRON HCL 4 MG PO TABS: 4.0000 mg | ORAL_TABLET | Freq: Four times a day (QID) | ORAL | Status: DC | PRN

## 2013-06-09 MED ORDER — DICYCLOMINE HCL 20 MG PO TABS
20.0000 mg | ORAL_TABLET | Freq: Three times a day (TID) | ORAL | Status: DC
  Administered 2013-06-09 – 2013-06-14 (×14): 20 mg via ORAL
  Filled 2013-06-09 (×17): qty 1

## 2013-06-09 MED ORDER — CARVEDILOL 6.25 MG PO TABS
6.2500 mg | ORAL_TABLET | Freq: Two times a day (BID) | ORAL | Status: DC
  Administered 2013-06-10 – 2013-06-22 (×24): 6.25 mg via ORAL
  Filled 2013-06-09 (×30): qty 1

## 2013-06-09 MED ORDER — ACETAMINOPHEN 650 MG RE SUPP: 650.0000 mg | Freq: Four times a day (QID) | RECTAL | Status: DC | PRN

## 2013-06-09 MED ORDER — THERA M PLUS PO TABS: 1.0000 | ORAL_TABLET | Freq: Every day | ORAL | Status: DC

## 2013-06-09 MED ORDER — ASPIRIN 81 MG PO TABS: 81.0000 mg | ORAL_TABLET | Freq: Every day | ORAL | Status: DC

## 2013-06-09 MED ORDER — INFLUENZA VAC SPLIT QUAD 0.5 ML IM SUSP: 0.5000 mL | INTRAMUSCULAR | Status: DC

## 2013-06-09 MED ORDER — NITROGLYCERIN 0.4 MG SL SUBL: 0.4000 mg | SUBLINGUAL_TABLET | SUBLINGUAL | Status: DC | PRN

## 2013-06-09 MED ORDER — OXYCODONE HCL 5 MG PO TABS
5.0000 mg | ORAL_TABLET | ORAL | Status: DC | PRN
  Administered 2013-06-09 – 2013-06-11 (×2): 5 mg via ORAL
  Filled 2013-06-09 (×2): qty 1

## 2013-06-09 MED ORDER — ESCITALOPRAM OXALATE 20 MG PO TABS
20.0000 mg | ORAL_TABLET | Freq: Every day | ORAL | Status: DC
  Administered 2013-06-10 – 2013-06-22 (×13): 20 mg via ORAL
  Filled 2013-06-09 (×14): qty 1

## 2013-06-09 MED ORDER — HEPARIN SODIUM (PORCINE) 5000 UNIT/ML IJ SOLN
5000.0000 [IU] | Freq: Three times a day (TID) | INTRAMUSCULAR | Status: DC
  Administered 2013-06-09 – 2013-06-10 (×2): 5000 [IU] via SUBCUTANEOUS
  Filled 2013-06-09 (×5): qty 1

## 2013-06-09 MED ORDER — PANTOPRAZOLE SODIUM 40 MG PO TBEC
40.0000 mg | DELAYED_RELEASE_TABLET | Freq: Every day | ORAL | Status: DC
  Administered 2013-06-10 – 2013-06-22 (×12): 40 mg via ORAL
  Filled 2013-06-09 (×13): qty 1

## 2013-06-09 MED ORDER — ALPRAZOLAM 0.5 MG PO TABS
0.5000 mg | ORAL_TABLET | Freq: Three times a day (TID) | ORAL | Status: DC | PRN
  Administered 2013-06-09 – 2013-06-13 (×6): 0.5 mg via ORAL
  Filled 2013-06-09 (×6): qty 1

## 2013-06-09 MED ORDER — SODIUM CHLORIDE 0.9 % IV SOLN
1000.0000 mL | INTRAVENOUS | Status: DC
  Administered 2013-06-09: 1000 mL via INTRAVENOUS

## 2013-06-09 MED ORDER — ONDANSETRON HCL 4 MG/2ML IJ SOLN: 4.0000 mg | Freq: Four times a day (QID) | INTRAMUSCULAR | Status: DC | PRN

## 2013-06-09 MED ORDER — SODIUM CHLORIDE 0.9 % IV SOLN
INTRAVENOUS | Status: DC
  Administered 2013-06-09 (×2): via INTRAVENOUS

## 2013-06-09 MED ORDER — ALPRAZOLAM 0.5 MG PO TABS
0.5000 mg | ORAL_TABLET | Freq: Once | ORAL | Status: AC
  Administered 2013-06-09: 0.5 mg via ORAL
  Filled 2013-06-09: qty 1

## 2013-06-09 MED ORDER — MORPHINE SULFATE 2 MG/ML IJ SOLN
2.0000 mg | INTRAMUSCULAR | Status: DC | PRN
  Administered 2013-06-09 – 2013-06-11 (×6): 2 mg via INTRAVENOUS
  Filled 2013-06-09 (×7): qty 1

## 2013-06-09 MED ORDER — ADULT MULTIVITAMIN W/MINERALS CH
1.0000 | ORAL_TABLET | Freq: Every day | ORAL | Status: DC
  Administered 2013-06-11 – 2013-06-22 (×10): 1 via ORAL
  Filled 2013-06-09 (×13): qty 1

## 2013-06-09 MED ORDER — ASPIRIN EC 81 MG PO TBEC
81.0000 mg | DELAYED_RELEASE_TABLET | Freq: Every day | ORAL | Status: DC
  Administered 2013-06-10: 81 mg via ORAL
  Filled 2013-06-09: qty 1

## 2013-06-09 MED ORDER — ACETAMINOPHEN 325 MG PO TABS: 650.0000 mg | ORAL_TABLET | Freq: Four times a day (QID) | ORAL | Status: DC | PRN

## 2013-06-09 NOTE — ED Notes (Signed)
Foley catheter inserted while in Emergency Dept. due to pt and family request. Two RN's at bedside to place foley.

## 2013-06-09 NOTE — ED Notes (Signed)
Bed: WA07 Expected date:  Expected time:  Means of arrival:  Comments: 78y/o hypertension, fall

## 2013-06-09 NOTE — Progress Notes (Signed)
Utilization Review completed.  Jvon Meroney RN CM  

## 2013-06-09 NOTE — Progress Notes (Signed)
06/09/2013 A. Ryiah Bellissimo RNCM 1821pm EDCM called and left message for EDSW to see patient regarding initiating advance directives.  Patient reports she already has the information, she wants to initiate a DNR.  Social work consult has been placed.

## 2013-06-09 NOTE — ED Provider Notes (Addendum)
CSN: [id number]     Arrival date & time 06/09/13  1109 History   First MD Initiated Contact with Patient 06/09/13 1110     Chief Complaint  Patient presents with  . Fall  . Hip Pain    rt side  . Back Pain  . Headache    contusion   (Consider location/radiation/quality/duration/timing/severity/associated sxs/prior Treatment) HPI Comments: EMS stabilized, placed collar, gave 3 doses of fentanyl.  Pt did have nasuea and vomited subsequent to transport and given fentanyl.  Pt has had a knee replacement many years ago by a Dr. Brynda Greathouse on right.  Pt is not on coumadin, plavix.  Pt has a HA On left, hit head, also has some low back pain as well.  No LOC.  No CP, SOB.    Patient is a 78 y.o. female presenting with fall, hip pain, back pain, and headaches. The history is provided by the patient and the EMS personnel.  Fall This is a new problem. The current episode started 1 to 2 hours ago. Associated symptoms include headaches. Pertinent negatives include no chest pain.  Hip Pain This is a new problem. The current episode started 1 to 2 hours ago. Associated symptoms include headaches. Pertinent negatives include no chest pain. The symptoms are aggravated by bending and twisting. The symptoms are relieved by rest.  Back Pain Associated symptoms: headaches   Associated symptoms: no chest pain   Headache Pain location:  Frontal Quality:  Sharp Radiates to:  Does not radiate Duration:  1 hour Associated symptoms: back pain     Past Medical History  Diagnosis Date  . Takotsubo syndrome   . Other and unspecified hyperlipidemia   . Esophageal reflux   . Osteoarthrosis, unspecified whether generalized or localized, lower leg   . IBS (irritable bowel syndrome)   . Diverticular disease   . Shortness of breath     when my heart is not doing right  . Anemia   . Depression   . Anxiety   . Coronary artery disease   . Blood transfusion     2005 maybe  . Headache(784.0)     takes Prilosec     Past Surgical History  Procedure Laterality Date  . Knee surgery    . Hysterotomy    . Breast reconstruction    . Implantable contact lens implantation    . Cataract extraction w/ intraocular lens  implant, bilateral    . Tonsillectomy      as child  . Back surgery     History reviewed. No pertinent family history. History  Substance Use Topics  . Smoking status: Never Smoker   . Smokeless tobacco: Never Used  . Alcohol Use: No   OB History   Grav Para Term Preterm Abortions TAB SAB Ect Mult Living                 Review of Systems  Respiratory: Negative for chest tightness.   Cardiovascular: Negative for chest pain.  Musculoskeletal: Positive for arthralgias and back pain.  Skin: Negative for wound.  Neurological: Positive for headaches. Negative for syncope.  All other systems reviewed and are negative.    Allergies  Sulfamethoxazole-trimethoprim  Home Medications   Current Outpatient Rx  Name  Route  Sig  Dispense  Refill  . ALPRAZolam (XANAX) 0.5 MG tablet   Oral   Take 0.5 mg by mouth 3 (three) times daily as needed for anxiety or sleep. For anxiety.         Marland Kitchen  aspirin 81 MG tablet   Oral   Take 81 mg by mouth daily.          . calcium-vitamin D (OSCAL WITH D 500-200) 500-200 MG-UNIT per tablet   Oral   Take 1 tablet by mouth daily.           . carvedilol (COREG) 6.25 MG tablet   Oral   Take 6.25 mg by mouth 2 (two) times daily with a meal.          . dicyclomine (BENTYL) 20 MG tablet   Oral   Take 20 mg by mouth 3 (three) times daily.         Marland Kitchen escitalopram (LEXAPRO) 20 MG tablet   Oral   Take 20 mg by mouth 2 (two) times daily.          . Loperamide HCl (IMODIUM PO)   Oral   Take 1 tablet by mouth 2 (two) times daily.          . Multiple Vitamins-Minerals (MULTIVITAMINS THER. W/MINERALS) TABS   Oral   Take 1 tablet by mouth daily.          Marland Kitchen omeprazole (PRILOSEC) 20 MG capsule   Oral   Take 20 mg by mouth daily.            . vitamin B-12 (CYANOCOBALAMIN) 500 MCG tablet   Oral   Take 500 mcg by mouth daily.          . nitroGLYCERIN (NITROSTAT) 0.4 MG SL tablet   Sublingual   Place 1 tablet (0.4 mg total) under the tongue every 5 (five) minutes as needed. For chest pain.   25 tablet   2    BP 152/79  Pulse 71  Temp(Src) 97.8 F (36.6 C) (Oral)  Resp 18  SpO2 98% Physical Exam  Nursing note and vitals reviewed. Constitutional: She appears well-developed and well-nourished. No distress.  HENT:  Head:    Mouth/Throat: Uvula is midline. Mucous membranes are dry.  Eyes: Conjunctivae and EOM are normal. No scleral icterus.  Neck: Phonation normal. No spinous process tenderness and no muscular tenderness present.  Cardiovascular: Normal rate, regular rhythm and intact distal pulses.   Pulmonary/Chest: Effort normal. No respiratory distress.  Abdominal: Soft. She exhibits no distension. There is no tenderness.  Musculoskeletal:       Right shoulder: She exhibits normal range of motion and no tenderness.       Left shoulder: She exhibits normal range of motion and no tenderness.       Right hip: She exhibits decreased range of motion, tenderness, bony tenderness and deformity.       Right knee: No tenderness found.       Lumbar back: She exhibits tenderness and pain. She exhibits no bony tenderness, no deformity, no laceration and normal pulse.  Neurological: She is alert. She exhibits normal muscle tone.  Skin: Skin is warm and dry. She is not diaphoretic.    ED Course  Procedures (including critical care time) Labs Review Labs Reviewed  BASIC METABOLIC PANEL - Abnormal; Notable for the following:    Sodium 126 (*)    Chloride 91 (*)    Glucose, Bld 115 (*)    GFR calc non Af Amer 83 (*)    All other components within normal limits  CBC WITH DIFFERENTIAL - Abnormal; Notable for the following:    WBC 14.3 (*)    RBC 3.72 (*)    Hemoglobin 11.4 (*)  HCT 32.3 (*)    Neutrophils  Relative % 86 (*)    Neutro Abs 12.3 (*)    Lymphocytes Relative 9 (*)    All other components within normal limits  PROTIME-INR  URINALYSIS, ROUTINE W REFLEX MICROSCOPIC  TYPE AND SCREEN  ABO/RH   Imaging Review Dg Chest 1 View  06/09/2013   CLINICAL DATA:  78 year old female status post fall with hip fracture. Preoperative study. Initial encounter.  EXAM: CHEST - 1 VIEW  COMPARISON:  07/30/2011.  FINDINGS: Portable AP semi upright view at a 1212 hrs. Lung volumes are stable and within normal limits. Normal cardiac size and mediastinal contours. Visualized tracheal air column is within normal limits. No pneumothorax, edema or pleural effusion. Incidental breast implants.  IMPRESSION: No acute cardiopulmonary abnormality.   Electronically Signed   By: Augusto Gamble M.D.   On: 06/09/2013 12:27   Dg Hip Complete Right  06/09/2013   CLINICAL DATA:  78 year old female status post fall with right hip pain. Initial encounter.  EXAM: RIGHT HIP - COMPLETE 2+ VIEW  COMPARISON:  None.  FINDINGS: Impacted right femoral neck fracture with varus angulation. Right femoral head remains normally located. Intertrochanteric segment of the right femur intact. Pelvis intact. Postoperative changes to the lower lumbar spine. Proximal left femur grossly intact.  IMPRESSION: Acute right femoral neck fracture with varus impaction.   Electronically Signed   By: Augusto Gamble M.D.   On: 06/09/2013 12:27   Ct Head Wo Contrast  06/09/2013   CLINICAL DATA:  Fall.  EXAM: CT HEAD WITHOUT CONTRAST  CT CERVICAL SPINE WITHOUT CONTRAST  TECHNIQUE: Multidetector CT imaging of the head and cervical spine was performed following the standard protocol without intravenous contrast. Multiplanar CT image reconstructions of the cervical spine were also generated.  COMPARISON:  07/15/2012 brain MR. 03/22/2009 head CT and cervical spine CT.  FINDINGS: CT HEAD FINDINGS  No skull fracture or intracranial hemorrhage.  Mild small vessel disease type  changes without CT evidence of large acute infarct.  Global atrophy.  No intracranial mass lesion noted on this unenhanced exam  CT CERVICAL SPINE FINDINGS  No cervical spine fracture detected.  Cervical alignment and degenerative changes relatively similar to the prior exam including transverse ligament hypertrophy. No abnormal prevertebral soft tissue swelling. If there is a high clinical suspicion of ligamentous injury, flexion and extension views or MR can be performed for further delineation.  Scarring lung apices  IMPRESSION: No skull fracture or intracranial hemorrhage.  No cervical spine fracture detected.  Please see above.   Electronically Signed   By: Bridgett Larsson M.D.   On: 06/09/2013 12:54   Ct Cervical Spine Wo Contrast  06/09/2013   CLINICAL DATA:  Fall.  EXAM: CT HEAD WITHOUT CONTRAST  CT CERVICAL SPINE WITHOUT CONTRAST  TECHNIQUE: Multidetector CT imaging of the head and cervical spine was performed following the standard protocol without intravenous contrast. Multiplanar CT image reconstructions of the cervical spine were also generated.  COMPARISON:  07/15/2012 brain MR. 03/22/2009 head CT and cervical spine CT.  FINDINGS: CT HEAD FINDINGS  No skull fracture or intracranial hemorrhage.  Mild small vessel disease type changes without CT evidence of large acute infarct.  Global atrophy.  No intracranial mass lesion noted on this unenhanced exam  CT CERVICAL SPINE FINDINGS  No cervical spine fracture detected.  Cervical alignment and degenerative changes relatively similar to the prior exam including transverse ligament hypertrophy. No abnormal prevertebral soft tissue swelling. If there is a high  clinical suspicion of ligamentous injury, flexion and extension views or MR can be performed for further delineation.  Scarring lung apices  IMPRESSION: No skull fracture or intracranial hemorrhage.  No cervical spine fracture detected.  Please see above.   Electronically Signed   By: Bridgett Larsson M.D.    On: 06/09/2013 12:54    EKG Interpretation    Date/Time:  Tuesday June 09 2013 13:40:46 EST Ventricular Rate:  86 PR Interval:  213 QRS Duration: 79 QT Interval:  414 QTC Calculation: 495 R Axis:   45 Text Interpretation:  Sinus rhythm Borderline prolonged PR interval Low voltage, extremity and precordial leads Baseline wander Borderline prolonged QT interval Borderline ECG Confirmed by [facility]  MD, MICHEAL ([id number]) on 06/09/2013 2:43:33 PM           ra sat is 100% and I interpret to be normal    1:07 PM Nausea improved.  Na is low at 126, will continue IVF's, pt is made NPO.  Will consult hosptialist as well as orthopedics for right hip fracture.  Other films including head and C spine CT are neg for acute.   2:42 PM Dr. Laural Benes has seen pt.  I sopke to Dr. Ophelia Charter with ortho and pt and family are ok to be seen and cared for by him.  Dr. Ophelia Charter has contacted OR and expects pt can be taken to the OR tomorrow at 3 PM and reports pt can eat today for now.     MDM   1. Hip fracture requiring operative repair, right, closed, initial encounter   2. Hyponatremia   3. Minor head injury, initial encounter      Pt with suspected hip fracture on exam.  Will get CT of head, c spine as well.  IV pain meds, IV zofran for nausea and will get plain films.  ECG for pre-op.  Will need admission and ortho consultation.     Gavin Pound. Oletta Lamas, MD 06/09/13 1307  Gavin Pound. Oletta Lamas, MD 06/09/13 1315  Gavin Pound. Oletta Lamas, MD 06/09/13 1443

## 2013-06-09 NOTE — ED Notes (Signed)
MD at bedside. Admitting MD Laural Benes in to see pt and family for admission

## 2013-06-09 NOTE — Progress Notes (Signed)
   CARE MANAGEMENT ED NOTE 06/09/2013  Patient:  Desiree, Mckay   Account Number:  [id number]  Date Initiated:  06/09/2013  Documentation initiated by:  Edd Arbour  Subjective/Objective Assessment:   78 yr old female medicare/bcbs  pt without pcp listed in EPIC  c/o fall slipping down the stairs outside her home husband assisted back in the home NA 126 dx Acute right femoral neck fracture with varus impaction.     Subjective/Objective Assessment Detail:   cv dr cooper Given in [facility] ED ns bolus, zofran iv, fentanyl iv x2 xanax po x 1 pending ortho consult     Action/Plan:   spoke with pt to confirm pcp as fred wilson  EPIC updated   Action/Plan Detail:   Anticipated DC Date:  06/12/2013     Status Recommendation to Physician:   Result of Recommendation:    Other ED Services  Consult Working Plan    DC Planning Services  Other  PCP issues  Outpatient Services - Pt will follow up    Choice offered to / List presented to:            Status of service:  Completed, signed off  ED Comments:   ED Comments Detail:

## 2013-06-09 NOTE — H&P (Signed)
History and Physical Examination  [name] GNF:[id number] DOB: 04/22/1935 DOA: 06/09/2013  Referring physician: Dr. Oletta Lamas Specialists: Dr.Cooper Cardiologist  Chief Complaint: Right hip pain after fall   HPI: [name] is a 78 y.o. female who has a history of gait instability reports that she has been having increasing imbalance over the last 2 days.  Patient has a past medical history significant for chronic low back pain, IBS, anemia, depression and anxiety disorder.  She also reports that she has had diarrhea over the past 2 days.  The patient lives at home with her husband.  She ended up slipping down the stairs outside her home earlier today.  Her husband dragged her back into the house.  The patient complained of severe right side pain 10/10.  The fall was unwitnessed.  The patient experienced a contusion to the left side of her for head.  There was no loss of consciousness.  The patient has also experienced an exacerbation of her chronic back pain.  The patient was brought to the emergency department by EMS where x-rays revealed that the patient has no acute right hip fracture.  The patient was found to be clinically dehydrated and hyponatremic.  A medical consultation was called for hospital admission and orthopedics was also called.  Past Medical History Past Medical History  Diagnosis Date  . Takotsubo syndrome   . Other and unspecified hyperlipidemia   . Esophageal reflux   . Osteoarthrosis, unspecified whether generalized or localized, lower leg   . IBS (irritable bowel syndrome)   . Diverticular disease   . Shortness of breath     when my heart is not doing right  . Anemia   . Depression   . Anxiety   . Coronary artery disease   . Blood transfusion     2005 maybe  . Headache(784.0)     takes Prilosec    Past Surgical History Past Surgical History  Procedure Laterality Date  . Knee surgery    . Hysterotomy    . Breast reconstruction    . Implantable contact lens  implantation    . Cataract extraction w/ intraocular lens  implant, bilateral    . Tonsillectomy      as child  . Back surgery      Home Meds: Prior to Admission medications   Medication Sig Start Date End Date Taking? Authorizing Provider  ALPRAZolam Prudy Feeler) 0.5 MG tablet Take 0.5 mg by mouth 3 (three) times daily as needed for anxiety or sleep. For anxiety.   Yes Historical Provider, MD  aspirin 81 MG tablet Take 81 mg by mouth daily.    Yes Historical Provider, MD  calcium-vitamin D (OSCAL WITH D 500-200) 500-200 MG-UNIT per tablet Take 1 tablet by mouth daily.     Yes Historical Provider, MD  carvedilol (COREG) 6.25 MG tablet Take 6.25 mg by mouth 2 (two) times daily with a meal.    Yes Historical Provider, MD  dicyclomine (BENTYL) 20 MG tablet Take 20 mg by mouth 3 (three) times daily.   Yes Historical Provider, MD  escitalopram (LEXAPRO) 20 MG tablet Take 20 mg by mouth 2 (two) times daily.    Yes Historical Provider, MD  Loperamide HCl (IMODIUM PO) Take 1 tablet by mouth 2 (two) times daily.    Yes Historical Provider, MD  Multiple Vitamins-Minerals (MULTIVITAMINS THER. W/MINERALS) TABS Take 1 tablet by mouth daily.    Yes Historical Provider, MD  omeprazole (PRILOSEC) 20 MG capsule Take 20  mg by mouth daily.     Yes Historical Provider, MD  vitamin B-12 (CYANOCOBALAMIN) 500 MCG tablet Take 500 mcg by mouth daily.    Yes Historical Provider, MD  nitroGLYCERIN (NITROSTAT) 0.4 MG SL tablet Place 1 tablet (0.4 mg total) under the tongue every 5 (five) minutes as needed. For chest pain. 04/22/13   Tonny Bollman, MD    Allergies: Sulfamethoxazole-trimethoprim  Social History:  History   Social History  . Marital Status: Married    Spouse Name: N/A    Number of Children: N/A  . Years of Education: N/A   Occupational History  . Not on file.   Social History Main Topics  . Smoking status: Never Smoker   . Smokeless tobacco: Never Used  . Alcohol Use: No  . Drug Use: No  .  Sexual Activity: No   Other Topics Concern  . Not on file   Social History Narrative    Lives locally with her husband.  While she herself does not    smoke, she reports she had substantial exposure to second hand smoke during her adult life.   Family History: History reviewed. No pertinent family history.  Review of Systems: Positives include severe back pain, right hip pain, right leg pain, muscle pain.  The patient denies anorexia, fever, weight loss,, vision loss, decreased hearing, hoarseness, chest pain, syncope, dyspnea on exertion, peripheral edema, balance deficits, hemoptysis, abdominal pain, melena, hematochezia, severe indigestion/heartburn, hematuria, incontinence, genital sores, muscle weakness, suspicious skin lesions, transient blindness,  unusual weight change, abnormal bleeding, enlarged lymph nodes, angioedema, and breast masses.   All other systems reviewed and reported as negative.   Physical Exam: Blood pressure 152/79, pulse 71, temperature 97.8 F (36.6 C), temperature source Oral, resp. rate 18, SpO2 98.00%. General appearance: alert, cooperative, appears stated age and no distress Head: Normocephalic, without obvious abnormality, atraumatic Eyes: negative Nose: Nares normal. Septum midline. Mucosa normal. No drainage or sinus tenderness., no discharge Throat: False teeth, dry mucous membranes Neck: no adenopathy, no carotid bruit, no JVD, supple, symmetrical, trachea midline and thyroid not enlarged, symmetric, no tenderness/mass/nodules Lungs: clear to auscultation bilaterally and normal percussion bilaterally Heart: S1, S2 normal Abdomen: soft, non-tender; bowel sounds normal; no masses,  no organomegaly Extremities: No pretibial edema or cyanosis or clubbing, significant tenderness in the right hip and groin Pulses: 2+ and symmetric Skin: Skin color, texture, turgor normal. No rashes or lesions Neurologic: Grossly normal  Lab  And Imaging results:   Results for orders placed during the hospital encounter of 06/09/13 (from the past 24 hour(s))  ABO/RH     Status: None   Collection Time    06/09/13 11:15 AM      Result Value Range   ABO/RH(D) A POS    BASIC METABOLIC PANEL     Status: Abnormal   Collection Time    06/09/13 11:42 AM      Result Value Range   Sodium 126 (*) 135 - 145 mEq/L   Potassium 3.6  3.5 - 5.1 mEq/L   Chloride 91 (*) 96 - 112 mEq/L   CO2 24  19 - 32 mEq/L   Glucose, Bld 115 (*) 70 - 99 mg/dL   BUN 7  6 - 23 mg/dL   Creatinine, Ser 1.61  0.50 - 1.10 mg/dL   Calcium 9.0  8.4 - 09.6 mg/dL   GFR calc non Af Amer 83 (*) >90 mL/min   GFR calc Af Amer >90  >90 mL/min  CBC  WITH DIFFERENTIAL     Status: Abnormal   Collection Time    06/09/13 11:42 AM      Result Value Range   WBC 14.3 (*) 4.0 - 10.5 K/uL   RBC 3.72 (*) 3.87 - 5.11 MIL/uL   Hemoglobin 11.4 (*) 12.0 - 15.0 g/dL   HCT 16.1 (*) 09.6 - 04.5 %   MCV 86.8  78.0 - 100.0 fL   MCH 30.6  26.0 - 34.0 pg   MCHC 35.3  30.0 - 36.0 g/dL   RDW 40.9  81.1 - 91.4 %   Platelets 274  150 - 400 K/uL   Neutrophils Relative % 86 (*) 43 - 77 %   Neutro Abs 12.3 (*) 1.7 - 7.7 K/uL   Lymphocytes Relative 9 (*) 12 - 46 %   Lymphs Abs 1.2  0.7 - 4.0 K/uL   Monocytes Relative 4  3 - 12 %   Monocytes Absolute 0.6  0.1 - 1.0 K/uL   Eosinophils Relative 1  0 - 5 %   Eosinophils Absolute 0.2  0.0 - 0.7 K/uL   Basophils Relative 0  0 - 1 %   Basophils Absolute 0.0  0.0 - 0.1 K/uL  PROTIME-INR     Status: None   Collection Time    06/09/13 11:42 AM      Result Value Range   Prothrombin Time 13.6  11.6 - 15.2 seconds   INR 1.06  0.00 - 1.49  TYPE AND SCREEN     Status: None   Collection Time    06/09/13 11:42 AM      Result Value Range   ABO/RH(D) A POS     Antibody Screen NEG     Sample Expiration 06/12/2013     Impression / Plan    Hip fracture, right / Acute Right HipPain  - Work on improving pain control with IV pain medications - Consult an orthopedist  Dr. Ophelia Charter who will see the patient today who will take patient to OR -Monitor hemoglobin    Hyponatremia/  Dehydration -IV fluid hydration -Monitor electrolytes  Leukocytosis - likely reactive - check urinalysis - CXR - no acute   Generalized Anxiety Disorder - Resume home alprazolam 0.5 mg po TID prn  IRRITABLE BOWEL SYNDROME -Pt reports that she has not had any diarrhea today, will continue to keep patient well hydrated.  Takotsubo's Cardiomyopathy -Patient recently saw her cardiologist who reported that her LV function has normalized.  She should continue taking carvedilol and no further testing was indicated.  GERD - Will prescribe protonix  Depression -Continue home lexapro  History of CAD - Pt had a nuclear scan in 2012 that showed no ischemia and the gated LVEF was 82%.   OSTEOARTHRITIS, KNEE, RIGHT  Family Communication: Daughter at bedside Code Status : Full Code   Standley Dakins MD [facility] [place], [place] [phone] 06/09/2013, 2:42 PM

## 2013-06-09 NOTE — ED Notes (Signed)
Per pt and family they request Charlann Boxer or Alusio from [facility].

## 2013-06-09 NOTE — Consult Note (Signed)
Reason for Consult:  Fall out Kitchen door with right femoral neck fracture  Referring Physician:  C. Johnson  MD  [name] is an 78 y.o. female.  HPI: community ambulator  In past with Lumbar fusion  2 yrs ago. Walks her dog one block daily.   Past Medical History  Diagnosis Date  . Takotsubo syndrome   . Other and unspecified hyperlipidemia   . Esophageal reflux   . Osteoarthrosis, unspecified whether generalized or localized, lower leg   . IBS (irritable bowel syndrome)   . Diverticular disease   . Shortness of breath     when my heart is not doing right  . Anemia   . Depression   . Anxiety   . Coronary artery disease   . Blood transfusion     2005 maybe  . Headache(784.0)     takes Prilosec    Past Surgical History  Procedure Laterality Date  . Knee surgery    . Hysterotomy    . Breast reconstruction    . Implantable contact lens implantation    . Cataract extraction w/ intraocular lens  implant, bilateral    . Tonsillectomy      as child  . Back surgery      History reviewed. No pertinent family history.  Social History:  reports that she has never smoked. She has never used smokeless tobacco. She reports that she does not drink alcohol or use illicit drugs.  Allergies:  Allergies  Allergen Reactions  . Sulfamethoxazole-Trimethoprim Hives    Medications: I have reviewed the patient's current medications.  Results for orders placed during the hospital encounter of 06/09/13 (from the past 48 hour(s))  ABO/RH     Status: None   Collection Time    06/09/13 11:15 AM      Result Value Range   ABO/RH(D) A POS    BASIC METABOLIC PANEL     Status: Abnormal   Collection Time    06/09/13 11:42 AM      Result Value Range   Sodium 126 (*) 135 - 145 mEq/L   Potassium 3.6  3.5 - 5.1 mEq/L   Chloride 91 (*) 96 - 112 mEq/L   CO2 24  19 - 32 mEq/L   Glucose, Bld 115 (*) 70 - 99 mg/dL   BUN 7  6 - 23 mg/dL   Creatinine, Ser 0.67  0.50 - 1.10 mg/dL   Calcium  9.0  8.4 - 10.5 mg/dL   GFR calc non Af Amer 83 (*) >90 mL/min   GFR calc Af Amer >90  >90 mL/min   Comment: (NOTE)     The eGFR has been calculated using the CKD EPI equation.     This calculation has not been validated in all clinical situations.     eGFR's persistently <90 mL/min signify possible Chronic Kidney     Disease.  CBC WITH DIFFERENTIAL     Status: Abnormal   Collection Time    06/09/13 11:42 AM      Result Value Range   WBC 14.3 (*) 4.0 - 10.5 K/uL   RBC 3.72 (*) 3.87 - 5.11 MIL/uL   Hemoglobin 11.4 (*) 12.0 - 15.0 g/dL   HCT 32.3 (*) 36.0 - 46.0 %   MCV 86.8  78.0 - 100.0 fL   MCH 30.6  26.0 - 34.0 pg   MCHC 35.3  30.0 - 36.0 g/dL   RDW 12.0  11.5 - 15.5 %   Platelets 274    150 - 400 K/uL   Neutrophils Relative % 86 (*) 43 - 77 %   Neutro Abs 12.3 (*) 1.7 - 7.7 K/uL   Lymphocytes Relative 9 (*) 12 - 46 %   Lymphs Abs 1.2  0.7 - 4.0 K/uL   Monocytes Relative 4  3 - 12 %   Monocytes Absolute 0.6  0.1 - 1.0 K/uL   Eosinophils Relative 1  0 - 5 %   Eosinophils Absolute 0.2  0.0 - 0.7 K/uL   Basophils Relative 0  0 - 1 %   Basophils Absolute 0.0  0.0 - 0.1 K/uL  PROTIME-INR     Status: None   Collection Time    06/09/13 11:42 AM      Result Value Range   Prothrombin Time 13.6  11.6 - 15.2 seconds   INR 1.06  0.00 - 1.49  TYPE AND SCREEN     Status: None   Collection Time    06/09/13 11:42 AM      Result Value Range   ABO/RH(D) A POS     Antibody Screen NEG     Sample Expiration 06/12/2013    URINALYSIS, ROUTINE W REFLEX MICROSCOPIC     Status: Abnormal   Collection Time    06/09/13  1:36 PM      Result Value Range   Color, Urine YELLOW  YELLOW   APPearance CLEAR  CLEAR   Specific Gravity, Urine 1.014  1.005 - 1.030   pH 8.0  5.0 - 8.0   Glucose, UA NEGATIVE  NEGATIVE mg/dL   Hgb urine dipstick SMALL (*) NEGATIVE   Bilirubin Urine NEGATIVE  NEGATIVE   Ketones, ur NEGATIVE  NEGATIVE mg/dL   Protein, ur NEGATIVE  NEGATIVE mg/dL   Urobilinogen, UA 0.2   0.0 - 1.0 mg/dL   Nitrite NEGATIVE  NEGATIVE   Leukocytes, UA NEGATIVE  NEGATIVE  URINE MICROSCOPIC-ADD ON     Status: None   Collection Time    06/09/13  1:36 PM      Result Value Range   RBC / HPF 3-6  <3 RBC/hpf    Dg Chest 1 View  06/09/2013   CLINICAL DATA:  78-year-old female status post fall with hip fracture. Preoperative study. Initial encounter.  EXAM: CHEST - 1 VIEW  COMPARISON:  07/30/2011.  FINDINGS: Portable AP semi upright view at a 1212 hrs. Lung volumes are stable and within normal limits. Normal cardiac size and mediastinal contours. Visualized tracheal air column is within normal limits. No pneumothorax, edema or pleural effusion. Incidental breast implants.  IMPRESSION: No acute cardiopulmonary abnormality.   Electronically Signed   By: Lee  Hall M.D.   On: 06/09/2013 12:27   Dg Hip Complete Right  06/09/2013   CLINICAL DATA:  78-year-old female status post fall with right hip pain. Initial encounter.  EXAM: RIGHT HIP - COMPLETE 2+ VIEW  COMPARISON:  None.  FINDINGS: Impacted right femoral neck fracture with varus angulation. Right femoral head remains normally located. Intertrochanteric segment of the right femur intact. Pelvis intact. Postoperative changes to the lower lumbar spine. Proximal left femur grossly intact.  IMPRESSION: Acute right femoral neck fracture with varus impaction.   Electronically Signed   By: Lee  Hall M.D.   On: 06/09/2013 12:27   Ct Head Wo Contrast  06/09/2013   CLINICAL DATA:  Fall.  EXAM: CT HEAD WITHOUT CONTRAST  CT CERVICAL SPINE WITHOUT CONTRAST  TECHNIQUE: Multidetector CT imaging of the head and cervical spine was performed following   the standard protocol without intravenous contrast. Multiplanar CT image reconstructions of the cervical spine were also generated.  COMPARISON:  07/15/2012 brain MR. 03/22/2009 head CT and cervical spine CT.  FINDINGS: CT HEAD FINDINGS  No skull fracture or intracranial hemorrhage.  Mild small vessel disease  type changes without CT evidence of large acute infarct.  Global atrophy.  No intracranial mass lesion noted on this unenhanced exam  CT CERVICAL SPINE FINDINGS  No cervical spine fracture detected.  Cervical alignment and degenerative changes relatively similar to the prior exam including transverse ligament hypertrophy. No abnormal prevertebral soft tissue swelling. If there is a high clinical suspicion of ligamentous injury, flexion and extension views or MR can be performed for further delineation.  Scarring lung apices  IMPRESSION: No skull fracture or intracranial hemorrhage.  No cervical spine fracture detected.  Please see above.   Electronically Signed   By: Steve  Olson M.D.   On: 06/09/2013 12:54   Ct Cervical Spine Wo Contrast  06/09/2013   CLINICAL DATA:  Fall.  EXAM: CT HEAD WITHOUT CONTRAST  CT CERVICAL SPINE WITHOUT CONTRAST  TECHNIQUE: Multidetector CT imaging of the head and cervical spine was performed following the standard protocol without intravenous contrast. Multiplanar CT image reconstructions of the cervical spine were also generated.  COMPARISON:  07/15/2012 brain MR. 03/22/2009 head CT and cervical spine CT.  FINDINGS: CT HEAD FINDINGS  No skull fracture or intracranial hemorrhage.  Mild small vessel disease type changes without CT evidence of large acute infarct.  Global atrophy.  No intracranial mass lesion noted on this unenhanced exam  CT CERVICAL SPINE FINDINGS  No cervical spine fracture detected.  Cervical alignment and degenerative changes relatively similar to the prior exam including transverse ligament hypertrophy. No abnormal prevertebral soft tissue swelling. If there is a high clinical suspicion of ligamentous injury, flexion and extension views or MR can be performed for further delineation.  Scarring lung apices  IMPRESSION: No skull fracture or intracranial hemorrhage.  No cervical spine fracture detected.  Please see above.   Electronically Signed   By: Steve  Olson  M.D.   On: 06/09/2013 12:54    Review of Systems  Constitutional: Negative for fever and chills.  Eyes: Negative for blurred vision and pain.       Corneal  Respiratory: Negative for cough.   Cardiovascular: Negative for chest pain and claudication.  Musculoskeletal: Positive for back pain.  Neurological: Negative for dizziness, focal weakness and seizures.  Endo/Heme/Allergies:       Breast augmentation  Psychiatric/Behavioral: Positive for depression. Negative for hallucinations and substance abuse.   Blood pressure 154/81, pulse 87, temperature 98.2 F (36.8 C), temperature source Oral, resp. rate 20, height 5' 3" (1.6 m), weight 58.5 kg (128 lb 15.5 oz), SpO2 95.00%. Physical Exam  Constitutional: She appears well-developed and well-nourished.  Eyes: Pupils are equal, round, and reactive to light.  Neck: Normal range of motion.  Respiratory: Effort normal.  GI: Soft.  Musculoskeletal:  Pain with hip ROM,  Pulses intact  Skin: Skin is warm and dry.  Psychiatric: She has a normal mood and affect.    Assessment/Plan:  Right femoral neck fracture , displaced.     Discussed with patient planned surgery tomorrow at about 4 PM ,  Risks of surgery , DVT, revision, reoperation and . dislocation all discussed. ?'s answered she agrees to proceed.   Cecilie Heidel C 06/09/2013, 6:30 PM      
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
## 2013-06-10 ENCOUNTER — Encounter (HOSPITAL_COMMUNITY): Payer: Self-pay | Admitting: *Deleted

## 2013-06-10 ENCOUNTER — Inpatient Hospital Stay (HOSPITAL_COMMUNITY): Payer: Medicare Other

## 2013-06-10 ENCOUNTER — Inpatient Hospital Stay (HOSPITAL_COMMUNITY): Payer: Medicare Other | Admitting: Anesthesiology

## 2013-06-10 ENCOUNTER — Encounter (HOSPITAL_COMMUNITY)
Admission: EM | Disposition: A | Discharge status: Home Health | Payer: Self-pay | Source: Home / Self Care | Attending: Internal Medicine

## 2013-06-10 ENCOUNTER — Encounter (HOSPITAL_COMMUNITY): Payer: Medicare Other | Admitting: Anesthesiology

## 2013-06-10 DIAGNOSIS — E43 Unspecified severe protein-calorie malnutrition: Secondary | ICD-10-CM | POA: Diagnosis present

## 2013-06-10 HISTORY — PX: HIP ARTHROPLASTY: SHX981

## 2013-06-10 LAB — COMPREHENSIVE METABOLIC PANEL
ALT: 18 U/L (ref 0–35)
Alkaline Phosphatase: 96 U/L (ref 39–117)
CO2: 26 mEq/L (ref 19–32)
Chloride: 95 mEq/L — ABNORMAL LOW (ref 96–112)
GFR calc Af Amer: >90 mL/min (ref >90)
GFR calc non Af Amer: 84 mL/min — ABNORMAL LOW (ref >90)
Glucose, Bld: 138 mg/dL — ABNORMAL HIGH (ref 70–99)
Potassium: 3.4 mEq/L — ABNORMAL LOW (ref 3.5–5.1)
Sodium: 129 mEq/L — ABNORMAL LOW (ref 135–145)
Total Bilirubin: 0.7 mg/dL (ref 0.3–1.2)

## 2013-06-10 LAB — CBC
Hemoglobin: 11.4 g/dL — ABNORMAL LOW (ref 12.0–15.0)
RBC: 3.69 MIL/uL — ABNORMAL LOW (ref 3.87–5.11)
WBC: 10.4 10*3/uL (ref 4.0–10.5)

## 2013-06-10 LAB — HEMOGLOBIN AND HEMATOCRIT, BLOOD
HCT: 28.1 % — ABNORMAL LOW (ref 36.0–46.0)
HCT: 31.2 % — ABNORMAL LOW (ref 36.0–46.0)
Hemoglobin: 9.8 g/dL — ABNORMAL LOW (ref 12.0–15.0)

## 2013-06-10 SURGERY — HEMIARTHROPLASTY, HIP, DIRECT ANTERIOR APPROACH, FOR FRACTURE
Anesthesia: Spinal | Site: Hip | Laterality: Right | Wound class: Clean

## 2013-06-10 MED ORDER — VANCOMYCIN HCL IN DEXTROSE 1-5 GM/200ML-% IV SOLN
1000.0000 mg | Freq: Once | INTRAVENOUS | Status: DC
  Filled 2013-06-10: qty 200

## 2013-06-10 MED ORDER — HYDROCODONE-ACETAMINOPHEN 5-325 MG PO TABS: 1.0000 | ORAL_TABLET | Freq: Four times a day (QID) | ORAL | Status: DC | PRN

## 2013-06-10 MED ORDER — ASPIRIN EC 325 MG PO TBEC
325.0000 mg | DELAYED_RELEASE_TABLET | Freq: Every day | ORAL | Status: DC
  Administered 2013-06-11 – 2013-06-17 (×7): 325 mg via ORAL
  Filled 2013-06-10 (×10): qty 1

## 2013-06-10 MED ORDER — MENTHOL 3 MG MT LOZG: 1.0000 | LOZENGE | OROMUCOSAL | Status: DC | PRN

## 2013-06-10 MED ORDER — ROCURONIUM BROMIDE 100 MG/10ML IV SOLN
INTRAVENOUS | Status: AC
  Filled 2013-06-10: qty 1

## 2013-06-10 MED ORDER — VANCOMYCIN HCL 1000 MG IV SOLR
1000.0000 mg | INTRAVENOUS | Status: DC | PRN
  Administered 2013-06-10: 1000 mg via INTRAVENOUS

## 2013-06-10 MED ORDER — POLYETHYLENE GLYCOL 3350 17 G PO PACK
17.0000 g | PACK | Freq: Every day | ORAL | Status: DC | PRN
  Filled 2013-06-10: qty 1

## 2013-06-10 MED ORDER — MEPERIDINE HCL 50 MG/ML IJ SOLN: 6.2500 mg | INTRAMUSCULAR | Status: DC | PRN

## 2013-06-10 MED ORDER — METOCLOPRAMIDE HCL 5 MG/ML IJ SOLN: 5.0000 mg | Freq: Three times a day (TID) | INTRAMUSCULAR | Status: DC | PRN

## 2013-06-10 MED ORDER — SUCCINYLCHOLINE CHLORIDE 20 MG/ML IJ SOLN
INTRAMUSCULAR | Status: AC
  Filled 2013-06-10: qty 1

## 2013-06-10 MED ORDER — ONDANSETRON HCL 4 MG/2ML IJ SOLN
INTRAMUSCULAR | Status: DC | PRN
  Administered 2013-06-10: 4 mg via INTRAVENOUS

## 2013-06-10 MED ORDER — BISACODYL 10 MG RE SUPP: 10.0000 mg | Freq: Every day | RECTAL | Status: DC | PRN

## 2013-06-10 MED ORDER — PHENOL 1.4 % MT LIQD: 1.0000 | OROMUCOSAL | Status: DC | PRN

## 2013-06-10 MED ORDER — CEFAZOLIN SODIUM 1-5 GM-% IV SOLN
INTRAVENOUS | Status: DC | PRN
  Administered 2013-06-10: 2 g via INTRAVENOUS

## 2013-06-10 MED ORDER — FENTANYL CITRATE 0.05 MG/ML IJ SOLN
INTRAMUSCULAR | Status: AC
  Filled 2013-06-10: qty 5

## 2013-06-10 MED ORDER — CEFAZOLIN SODIUM-DEXTROSE 2-3 GM-% IV SOLR: 2.0000 g | Freq: Once | INTRAVENOUS | Status: DC

## 2013-06-10 MED ORDER — FLEET ENEMA 7-19 GM/118ML RE ENEM: 1.0000 | ENEMA | Freq: Once | RECTAL | Status: AC | PRN

## 2013-06-10 MED ORDER — ONDANSETRON HCL 4 MG PO TABS: 4.0000 mg | ORAL_TABLET | Freq: Four times a day (QID) | ORAL | Status: DC | PRN

## 2013-06-10 MED ORDER — BUPIVACAINE HCL (PF) 0.5 % IJ SOLN
INTRAMUSCULAR | Status: DC | PRN
  Administered 2013-06-10: 3 mL

## 2013-06-10 MED ORDER — POTASSIUM CHLORIDE IN NACL 20-0.9 MEQ/L-% IV SOLN
INTRAVENOUS | Status: DC
  Administered 2013-06-10 – 2013-06-12 (×4): via INTRAVENOUS
  Filled 2013-06-10 (×5): qty 1000

## 2013-06-10 MED ORDER — DOCUSATE SODIUM 100 MG PO CAPS
100.0000 mg | ORAL_CAPSULE | Freq: Two times a day (BID) | ORAL | Status: DC
  Administered 2013-06-10 – 2013-06-22 (×18): 100 mg via ORAL
  Filled 2013-06-10 (×25): qty 1

## 2013-06-10 MED ORDER — MORPHINE SULFATE 2 MG/ML IJ SOLN: 0.5000 mg | INTRAMUSCULAR | Status: DC | PRN

## 2013-06-10 MED ORDER — LACTATED RINGERS IV SOLN
INTRAVENOUS | Status: DC | PRN
  Administered 2013-06-10 (×2): via INTRAVENOUS

## 2013-06-10 MED ORDER — ACETAMINOPHEN 650 MG RE SUPP: 650.0000 mg | Freq: Four times a day (QID) | RECTAL | Status: DC | PRN

## 2013-06-10 MED ORDER — LIDOCAINE HCL (CARDIAC) 20 MG/ML IV SOLN
INTRAVENOUS | Status: AC
  Filled 2013-06-10: qty 5

## 2013-06-10 MED ORDER — ACETAMINOPHEN 500 MG PO TABS
1000.0000 mg | ORAL_TABLET | Freq: Four times a day (QID) | ORAL | Status: AC
  Administered 2013-06-10 – 2013-06-11 (×4): 1000 mg via ORAL
  Filled 2013-06-10 (×5): qty 2

## 2013-06-10 MED ORDER — PROMETHAZINE HCL 25 MG/ML IJ SOLN: 6.2500 mg | INTRAMUSCULAR | Status: DC | PRN

## 2013-06-10 MED ORDER — ONDANSETRON HCL 4 MG/2ML IJ SOLN: 4.0000 mg | Freq: Four times a day (QID) | INTRAMUSCULAR | Status: DC | PRN

## 2013-06-10 MED ORDER — ONDANSETRON HCL 4 MG/2ML IJ SOLN
4.0000 mg | Freq: Four times a day (QID) | INTRAMUSCULAR | Status: DC | PRN
  Administered 2013-06-10: 4 mg via INTRAVENOUS
  Filled 2013-06-10 (×2): qty 2

## 2013-06-10 MED ORDER — PHENYLEPHRINE HCL 10 MG/ML IJ SOLN
INTRAMUSCULAR | Status: AC
  Filled 2013-06-10: qty 1

## 2013-06-10 MED ORDER — ACETAMINOPHEN 325 MG PO TABS
650.0000 mg | ORAL_TABLET | Freq: Four times a day (QID) | ORAL | Status: DC | PRN
  Administered 2013-06-12 – 2013-06-21 (×13): 650 mg via ORAL
  Filled 2013-06-10 (×14): qty 2

## 2013-06-10 MED ORDER — HYDROCODONE-ACETAMINOPHEN 5-325 MG PO TABS
1.0000 | ORAL_TABLET | Freq: Four times a day (QID) | ORAL | Status: DC | PRN
  Administered 2013-06-12: 2 via ORAL
  Administered 2013-06-12: 1 via ORAL
  Administered 2013-06-12: 2 via ORAL
  Filled 2013-06-10: qty 2
  Filled 2013-06-10: qty 1
  Filled 2013-06-10: qty 2

## 2013-06-10 MED ORDER — FENTANYL CITRATE 0.05 MG/ML IJ SOLN
INTRAMUSCULAR | Status: DC | PRN
  Administered 2013-06-10: 50 ug via INTRAVENOUS

## 2013-06-10 MED ORDER — CEFAZOLIN SODIUM-DEXTROSE 2-3 GM-% IV SOLR
INTRAVENOUS | Status: AC
  Filled 2013-06-10: qty 50

## 2013-06-10 MED ORDER — METOCLOPRAMIDE HCL 10 MG PO TABS: 5.0000 mg | ORAL_TABLET | Freq: Three times a day (TID) | ORAL | Status: DC | PRN

## 2013-06-10 MED ORDER — BUPIVACAINE HCL (PF) 0.5 % IJ SOLN
INTRAMUSCULAR | Status: AC
  Filled 2013-06-10: qty 30

## 2013-06-10 MED ORDER — ONDANSETRON HCL 4 MG/2ML IJ SOLN
INTRAMUSCULAR | Status: AC
  Filled 2013-06-10: qty 2

## 2013-06-10 MED ORDER — PROPOFOL INFUSION 10 MG/ML OPTIME
INTRAVENOUS | Status: DC | PRN
  Administered 2013-06-10: 25 ug/kg/min via INTRAVENOUS

## 2013-06-10 MED ORDER — PHENYLEPHRINE HCL 10 MG/ML IJ SOLN
10.0000 mg | INTRAVENOUS | Status: DC | PRN
  Administered 2013-06-10: 50 ug/min via INTRAVENOUS

## 2013-06-10 MED ORDER — SODIUM CHLORIDE 0.9 % IR SOLN
Status: DC | PRN
  Administered 2013-06-10: 3000 mL

## 2013-06-10 MED ORDER — FENTANYL CITRATE 0.05 MG/ML IJ SOLN: 25.0000 ug | INTRAMUSCULAR | Status: DC | PRN

## 2013-06-10 MED ORDER — ACETAMINOPHEN 325 MG PO TABS: 650.0000 mg | ORAL_TABLET | Freq: Four times a day (QID) | ORAL | Status: DC | PRN

## 2013-06-10 MED ORDER — PROPOFOL 10 MG/ML IV BOLUS
INTRAVENOUS | Status: DC | PRN
  Administered 2013-06-10: 40 mg via INTRAVENOUS
  Administered 2013-06-10: 10 mg via INTRAVENOUS

## 2013-06-10 MED ORDER — SODIUM CHLORIDE 0.9 % IR SOLN
Status: DC | PRN
  Administered 2013-06-10: 1000 mL

## 2013-06-10 MED ORDER — PROPOFOL 10 MG/ML IV BOLUS
INTRAVENOUS | Status: AC
  Filled 2013-06-10: qty 20

## 2013-06-10 MED ORDER — VANCOMYCIN HCL IN DEXTROSE 1-5 GM/200ML-% IV SOLN
INTRAVENOUS | Status: AC
  Filled 2013-06-10: qty 200

## 2013-06-10 MED ORDER — CHLORHEXIDINE GLUCONATE CLOTH 2 % EX PADS
6.0000 | MEDICATED_PAD | Freq: Every day | CUTANEOUS | Status: AC
  Administered 2013-06-10 – 2013-06-12 (×3): 6 via TOPICAL

## 2013-06-10 MED ORDER — ASPIRIN 325 MG PO TABS: 325.0000 mg | ORAL_TABLET | Freq: Every day | ORAL | Status: DC

## 2013-06-10 MED ORDER — BUPIVACAINE HCL (PF) 0.5 % IJ SOLN
INTRAMUSCULAR | Status: DC | PRN
  Administered 2013-06-10: 10 mL

## 2013-06-10 MED ORDER — MUPIROCIN 2 % EX OINT
1.0000 "application " | TOPICAL_OINTMENT | Freq: Two times a day (BID) | CUTANEOUS | Status: AC
  Administered 2013-06-10 – 2013-06-14 (×9): 1 via NASAL
  Filled 2013-06-10 (×2): qty 22

## 2013-06-10 MED ORDER — ESCITALOPRAM OXALATE 20 MG PO TABS
20.0000 mg | ORAL_TABLET | Freq: Every day | ORAL | Status: DC
  Administered 2013-06-10: 20 mg via ORAL
  Filled 2013-06-10 (×3): qty 1

## 2013-06-10 SURGICAL SUPPLY — 53 items
BLADE SAW SAG 73X25 THK (BLADE) ×1
BLADE SAW SGTL 73X25 THK (BLADE) ×1 IMPLANT
BRUSH FEMORAL CANAL (MISCELLANEOUS) ×2 IMPLANT
CAPT HIP FX BIPOLAR/UNIPOLAR ×2 IMPLANT
CEMENT BONE DEPUY (Cement) ×4 IMPLANT
CEMENT RESTRICTOR DEPUY SZ 3 (Cement) ×2 IMPLANT
DRAPE INCISE IOBAN 66X45 STRL (DRAPES) ×2 IMPLANT
DRAPE ORTHO SPLIT 77X108 STRL (DRAPES) ×2
DRAPE POUCH INSTRU U-SHP 10X18 (DRAPES) ×2 IMPLANT
DRAPE SURG ORHT 6 SPLT 77X108 (DRAPES) ×2 IMPLANT
DRAPE U-SHAPE 47X51 STRL (DRAPES) ×2 IMPLANT
DRAPE WARM FLUID 44X44 (DRAPE) IMPLANT
DRSG PAD ABDOMINAL 8X10 ST (GAUZE/BANDAGES/DRESSINGS) ×2 IMPLANT
DURAPREP 26ML APPLICATOR (WOUND CARE) ×2 IMPLANT
ELECT BLADE TIP CTD 4 INCH (ELECTRODE) ×2 IMPLANT
ELECT REM PT RETURN 9FT ADLT (ELECTROSURGICAL) ×2
ELECTRODE REM PT RTRN 9FT ADLT (ELECTROSURGICAL) ×1 IMPLANT
EVACUATOR 1/8 PVC DRAIN (DRAIN) IMPLANT
FACESHIELD LNG OPTICON STERILE (SAFETY) ×10 IMPLANT
GAUZE XEROFORM 5X9 LF (GAUZE/BANDAGES/DRESSINGS) ×2 IMPLANT
GLOVE BIOGEL PI IND STRL 8 (GLOVE) ×1 IMPLANT
GLOVE BIOGEL PI INDICATOR 8 (GLOVE) ×1
GLOVE ORTHO TXT STRL SZ7.5 (GLOVE) ×2 IMPLANT
GOWN PREVENTION PLUS LG XLONG (DISPOSABLE) ×2 IMPLANT
HANDPIECE INTERPULSE COAX TIP (DISPOSABLE) ×1
IMMOBILIZER KNEE 20 (SOFTGOODS)
IMMOBILIZER KNEE 20 THIGH 36 (SOFTGOODS) IMPLANT
KIT BASIN OR (CUSTOM PROCEDURE TRAY) ×2 IMPLANT
NEEDLE HYPO 22GX1.5 SAFETY (NEEDLE) IMPLANT
NEEDLE MAYO .5 CIRCLE (NEEDLE) IMPLANT
NS IRRIG 1000ML POUR BTL (IV SOLUTION) ×4 IMPLANT
PACK TOTAL JOINT (CUSTOM PROCEDURE TRAY) ×2 IMPLANT
PASSER SUT SWANSON 36MM LOOP (INSTRUMENTS) IMPLANT
POSITIONER SURGICAL ARM (MISCELLANEOUS) ×2 IMPLANT
SET HNDPC FAN SPRY TIP SCT (DISPOSABLE) ×1 IMPLANT
SPONGE GAUZE 4X4 12PLY (GAUZE/BANDAGES/DRESSINGS) ×4 IMPLANT
SPONGE LAP 18X18 X RAY DECT (DISPOSABLE) IMPLANT
STAPLER VISISTAT 35W (STAPLE) IMPLANT
SUCTION FRAZIER 12FR DISP (SUCTIONS) IMPLANT
SUT ETHIBOND NAB CT1 #1 30IN (SUTURE) ×8 IMPLANT
SUT VIC AB 0 CT1 27 (SUTURE) ×1
SUT VIC AB 0 CT1 27XBRD ANTBC (SUTURE) ×1 IMPLANT
SUT VIC AB 1 CT1 27 (SUTURE)
SUT VIC AB 1 CT1 27XBRD ANTBC (SUTURE) IMPLANT
SUT VIC AB 1 CTX 36 (SUTURE)
SUT VIC AB 1 CTX36XBRD ANBCTR (SUTURE) IMPLANT
SUT VIC AB 2-0 CT1 36 (SUTURE) ×6 IMPLANT
SYR 30ML LL (SYRINGE) IMPLANT
TAPE CLOTH SURG 4X10 WHT LF (GAUZE/BANDAGES/DRESSINGS) ×2 IMPLANT
TOWEL OR 17X26 10 PK STRL BLUE (TOWEL DISPOSABLE) ×4 IMPLANT
TOWER CARTRIDGE SMART MIX (DISPOSABLE) ×2 IMPLANT
TRAY FOLEY CATH 14FRSI W/METER (CATHETERS) IMPLANT
WATER STERILE IRR 1500ML POUR (IV SOLUTION) ×4 IMPLANT

## 2013-06-10 NOTE — Progress Notes (Signed)
INITIAL NUTRITION ASSESSMENT  Pt meets criteria for severe MALNUTRITION in the context of chronic illness as evidenced by <75% estimated energy intake for the past 3 weeks in addition to pt with severe muscle wasting and subcutaneous fat loss in hands.  DOCUMENTATION CODES Per approved criteria  -Severe malnutrition in the context of chronic illness   INTERVENTION: - Diet advancement per MD - Will continue to monitor   NUTRITION DIAGNOSIS: Inadequate oral intake related to inability to eat as evidenced by NPO.   Goal: Advance diet as tolerated to regular diet  Monitor:  Weights, labs, diet advancement  Reason for Assessment: Nutrition risk   78 y.o. female  Admitting Dx: Hip fracture, right  ASSESSMENT: Admitted with right hip pain after a fall. Patient has a past medical history significant for chronic low back pain, IBS, anemia, depression and anxiety disorder. She also reports that she has had diarrhea over the past 2 days. The patient lives at home with her husband.  Attempted to meet with pt, however pt soundly asleep. Per conversation with husband over the phone, pt has been sleeping a lot for the past 3 weeks. He reports pt probably eats only 1.5 meals/day however her weight has been stable. States she snacks on cereal, chips, and ice cream. Was not on any nutritional supplements. Reports she has had diarrhea for years, sometimes as many as 5 times in 30 minutes. Reports she typically has at least 1-2 episodes/day. States she was seen at [facility] for this issue and was pt on Bentyl however he states she was on it for so long that it wasn't effective anymore so she was not on it recently.   Noted pt with severe muscle wasting/subcutaneous fat loss in hands and mild/moderate wasting in clavicles and upper arms.  - Pt with low sodium and potassium. Getting multivitamin.   Height: Ht Readings from Last 1 Encounters:  06/09/13 5\' 3"  (1.6 m)    Weight: Wt Readings from Last  1 Encounters:  06/09/13 128 lb 15.5 oz (58.5 kg)    Ideal Body Weight: 115 lb   % Ideal Body Weight: 111%  Wt Readings from Last 10 Encounters:  06/09/13 128 lb 15.5 oz (58.5 kg)  06/09/13 128 lb 15.5 oz (58.5 kg)  04/22/13 112 lb (50.803 kg)  08/08/11 125 lb 6.4 oz (56.881 kg)  08/08/11 125 lb 6.4 oz (56.881 kg)  07/30/11 117 lb 15.1 oz (53.5 kg)  10/24/10 121 lb (54.885 kg)  10/16/10 124 lb (56.246 kg)  10/04/09 129 lb 8 oz (58.741 kg)    Usual Body Weight: 128 lb per husband's report  % Usual Body Weight: 100%  BMI:  Body mass index is 22.85 kg/(m^2).  Estimated Nutritional Needs: Kcal: 1450-1650 Protein: 60-70g Fluid: 1.4-1.6L/day   Skin: Intact   Diet Order: NPO  EDUCATION NEEDS: -No education needs identified at this time   Intake/Output Summary (Last 24 hours) at 06/10/13 1147 Last data filed at 06/10/13 0713  Gross per 24 hour  Intake 2296.67 ml  Output   2900 ml  Net -603.33 ml    Last BM: 11/18  Labs:   Recent Labs Lab 06/09/13 1142 06/10/13 0525  NA 126* 129*  K 3.6 3.4*  CL 91* 95*  CO2 24 26  BUN 7 5*  CREATININE 0.67 0.65  CALCIUM 9.0 8.6  GLUCOSE 115* 138*    CBG (last 3)  No results found for this basename: GLUCAP,  in the last 72 hours  Scheduled Meds: .  aspirin EC  81 mg Oral Daily  . carvedilol  6.25 mg Oral BID WC  . Chlorhexidine Gluconate Cloth  6 each Topical Q0600  . dicyclomine  20 mg Oral TID  . escitalopram  20 mg Oral Daily  . heparin  5,000 Units Subcutaneous Q8H  . multivitamin with minerals  1 tablet Oral Daily  . mupirocin ointment  1 application Nasal BID  . pantoprazole  40 mg Oral Daily    Continuous Infusions: . sodium chloride 100 mL/hr at 06/09/13 2304    Past Medical History  Diagnosis Date  . Takotsubo syndrome   . Other and unspecified hyperlipidemia   . Esophageal reflux   . Osteoarthrosis, unspecified whether generalized or localized, lower leg   . IBS (irritable bowel syndrome)   .  Diverticular disease   . Shortness of breath     when my heart is not doing right  . Anemia   . Depression   . Anxiety   . Coronary artery disease   . Blood transfusion     2005 maybe  . Headache(784.0)     takes Prilosec    Past Surgical History  Procedure Laterality Date  . Knee surgery    . Hysterotomy    . Breast reconstruction    . Implantable contact lens implantation    . Cataract extraction w/ intraocular lens  implant, bilateral    . Tonsillectomy      as child  . Back surgery      Levon Hedger MS, RD, LDN [phone] Pager [phone] After Hours Pager

## 2013-06-10 NOTE — Transfer of Care (Signed)
Immediate Anesthesia Transfer of Care Note  Patient: [name]  Procedure(s) Performed: Procedure(s): ARTHROPLASTY MONOPOLAR HIP CEMENTED (Right)  Patient Location: PACU  Anesthesia Type:MAC and Spinal  Level of Consciousness: awake and alert   Airway & Oxygen Therapy: Patient Spontanous Breathing and Patient connected to face mask oxygen  Post-op Assessment: Report given to PACU RN and Post -op Vital signs reviewed and stable  Post vital signs: Reviewed and stable  Complications: No apparent anesthesia complications

## 2013-06-10 NOTE — Progress Notes (Signed)
Chaplain follow up re: advance directive.  Pt's daughter not present.  Spoke with pt about Adv. Directive, describing paperwork.  Pt indicates that husband knows wishes re: end of life care.  Will follow when pt's daughter present.    Belva Crome MDiv

## 2013-06-10 NOTE — H&P (View-Only) (Signed)
Reason for Consult:  Fall out Kitchen door with right femoral neck fracture  Referring Physician:  Maryln Manuel  MD  MIGDALIA OLEJNICZAK is an 78 y.o. female.  HPI: community ambulator  In past with Lumbar fusion  2 yrs ago. Walks her dog one block daily.   Past Medical History  Diagnosis Date  . Takotsubo syndrome   . Other and unspecified hyperlipidemia   . Esophageal reflux   . Osteoarthrosis, unspecified whether generalized or localized, lower leg   . IBS (irritable bowel syndrome)   . Diverticular disease   . Shortness of breath     when my heart is not doing right  . Anemia   . Depression   . Anxiety   . Coronary artery disease   . Blood transfusion     2005 maybe  . Headache(784.0)     takes Prilosec    Past Surgical History  Procedure Laterality Date  . Knee surgery    . Hysterotomy    . Breast reconstruction    . Implantable contact lens implantation    . Cataract extraction w/ intraocular lens  implant, bilateral    . Tonsillectomy      as child  . Back surgery      History reviewed. No pertinent family history.  Social History:  reports that she has never smoked. She has never used smokeless tobacco. She reports that she does not drink alcohol or use illicit drugs.  Allergies:  Allergies  Allergen Reactions  . Sulfamethoxazole-Trimethoprim Hives    Medications: I have reviewed the patient's current medications.  Results for orders placed during the hospital encounter of 06/09/13 (from the past 48 hour(s))  ABO/RH     Status: None   Collection Time    06/09/13 11:15 AM      Result Value Range   ABO/RH(D) A POS    BASIC METABOLIC PANEL     Status: Abnormal   Collection Time    06/09/13 11:42 AM      Result Value Range   Sodium 126 (*) 135 - 145 mEq/L   Potassium 3.6  3.5 - 5.1 mEq/L   Chloride 91 (*) 96 - 112 mEq/L   CO2 24  19 - 32 mEq/L   Glucose, Bld 115 (*) 70 - 99 mg/dL   BUN 7  6 - 23 mg/dL   Creatinine, Ser 1.61  0.50 - 1.10 mg/dL   Calcium  9.0  8.4 - 09.6 mg/dL   GFR calc non Af Amer 83 (*) >90 mL/min   GFR calc Af Amer >90  >90 mL/min   Comment: (NOTE)     The eGFR has been calculated using the CKD EPI equation.     This calculation has not been validated in all clinical situations.     eGFR's persistently <90 mL/min signify possible Chronic Kidney     Disease.  CBC WITH DIFFERENTIAL     Status: Abnormal   Collection Time    06/09/13 11:42 AM      Result Value Range   WBC 14.3 (*) 4.0 - 10.5 K/uL   RBC 3.72 (*) 3.87 - 5.11 MIL/uL   Hemoglobin 11.4 (*) 12.0 - 15.0 g/dL   HCT 04.5 (*) 40.9 - 81.1 %   MCV 86.8  78.0 - 100.0 fL   MCH 30.6  26.0 - 34.0 pg   MCHC 35.3  30.0 - 36.0 g/dL   RDW 91.4  78.2 - 95.6 %   Platelets 274  150 - 400 K/uL   Neutrophils Relative % 86 (*) 43 - 77 %   Neutro Abs 12.3 (*) 1.7 - 7.7 K/uL   Lymphocytes Relative 9 (*) 12 - 46 %   Lymphs Abs 1.2  0.7 - 4.0 K/uL   Monocytes Relative 4  3 - 12 %   Monocytes Absolute 0.6  0.1 - 1.0 K/uL   Eosinophils Relative 1  0 - 5 %   Eosinophils Absolute 0.2  0.0 - 0.7 K/uL   Basophils Relative 0  0 - 1 %   Basophils Absolute 0.0  0.0 - 0.1 K/uL  PROTIME-INR     Status: None   Collection Time    06/09/13 11:42 AM      Result Value Range   Prothrombin Time 13.6  11.6 - 15.2 seconds   INR 1.06  0.00 - 1.49  TYPE AND SCREEN     Status: None   Collection Time    06/09/13 11:42 AM      Result Value Range   ABO/RH(D) A POS     Antibody Screen NEG     Sample Expiration 06/12/2013    URINALYSIS, ROUTINE W REFLEX MICROSCOPIC     Status: Abnormal   Collection Time    06/09/13  1:36 PM      Result Value Range   Color, Urine YELLOW  YELLOW   APPearance CLEAR  CLEAR   Specific Gravity, Urine 1.014  1.005 - 1.030   pH 8.0  5.0 - 8.0   Glucose, UA NEGATIVE  NEGATIVE mg/dL   Hgb urine dipstick SMALL (*) NEGATIVE   Bilirubin Urine NEGATIVE  NEGATIVE   Ketones, ur NEGATIVE  NEGATIVE mg/dL   Protein, ur NEGATIVE  NEGATIVE mg/dL   Urobilinogen, UA 0.2   0.0 - 1.0 mg/dL   Nitrite NEGATIVE  NEGATIVE   Leukocytes, UA NEGATIVE  NEGATIVE  URINE MICROSCOPIC-ADD ON     Status: None   Collection Time    06/09/13  1:36 PM      Result Value Range   RBC / HPF 3-6  <3 RBC/hpf    Dg Chest 1 View  06/09/2013   CLINICAL DATA:  78 year old female status post fall with hip fracture. Preoperative study. Initial encounter.  EXAM: CHEST - 1 VIEW  COMPARISON:  07/30/2011.  FINDINGS: Portable AP semi upright view at a 1212 hrs. Lung volumes are stable and within normal limits. Normal cardiac size and mediastinal contours. Visualized tracheal air column is within normal limits. No pneumothorax, edema or pleural effusion. Incidental breast implants.  IMPRESSION: No acute cardiopulmonary abnormality.   Electronically Signed   By: Augusto Gamble M.D.   On: 06/09/2013 12:27   Dg Hip Complete Right  06/09/2013   CLINICAL DATA:  78 year old female status post fall with right hip pain. Initial encounter.  EXAM: RIGHT HIP - COMPLETE 2+ VIEW  COMPARISON:  None.  FINDINGS: Impacted right femoral neck fracture with varus angulation. Right femoral head remains normally located. Intertrochanteric segment of the right femur intact. Pelvis intact. Postoperative changes to the lower lumbar spine. Proximal left femur grossly intact.  IMPRESSION: Acute right femoral neck fracture with varus impaction.   Electronically Signed   By: Augusto Gamble M.D.   On: 06/09/2013 12:27   Ct Head Wo Contrast  06/09/2013   CLINICAL DATA:  Fall.  EXAM: CT HEAD WITHOUT CONTRAST  CT CERVICAL SPINE WITHOUT CONTRAST  TECHNIQUE: Multidetector CT imaging of the head and cervical spine was performed following  the standard protocol without intravenous contrast. Multiplanar CT image reconstructions of the cervical spine were also generated.  COMPARISON:  07/15/2012 brain MR. 03/22/2009 head CT and cervical spine CT.  FINDINGS: CT HEAD FINDINGS  No skull fracture or intracranial hemorrhage.  Mild small vessel disease  type changes without CT evidence of large acute infarct.  Global atrophy.  No intracranial mass lesion noted on this unenhanced exam  CT CERVICAL SPINE FINDINGS  No cervical spine fracture detected.  Cervical alignment and degenerative changes relatively similar to the prior exam including transverse ligament hypertrophy. No abnormal prevertebral soft tissue swelling. If there is a high clinical suspicion of ligamentous injury, flexion and extension views or MR can be performed for further delineation.  Scarring lung apices  IMPRESSION: No skull fracture or intracranial hemorrhage.  No cervical spine fracture detected.  Please see above.   Electronically Signed   By: Bridgett Larsson M.D.   On: 06/09/2013 12:54   Ct Cervical Spine Wo Contrast  06/09/2013   CLINICAL DATA:  Fall.  EXAM: CT HEAD WITHOUT CONTRAST  CT CERVICAL SPINE WITHOUT CONTRAST  TECHNIQUE: Multidetector CT imaging of the head and cervical spine was performed following the standard protocol without intravenous contrast. Multiplanar CT image reconstructions of the cervical spine were also generated.  COMPARISON:  07/15/2012 brain MR. 03/22/2009 head CT and cervical spine CT.  FINDINGS: CT HEAD FINDINGS  No skull fracture or intracranial hemorrhage.  Mild small vessel disease type changes without CT evidence of large acute infarct.  Global atrophy.  No intracranial mass lesion noted on this unenhanced exam  CT CERVICAL SPINE FINDINGS  No cervical spine fracture detected.  Cervical alignment and degenerative changes relatively similar to the prior exam including transverse ligament hypertrophy. No abnormal prevertebral soft tissue swelling. If there is a high clinical suspicion of ligamentous injury, flexion and extension views or MR can be performed for further delineation.  Scarring lung apices  IMPRESSION: No skull fracture or intracranial hemorrhage.  No cervical spine fracture detected.  Please see above.   Electronically Signed   By: Bridgett Larsson  M.D.   On: 06/09/2013 12:54    Review of Systems  Constitutional: Negative for fever and chills.  Eyes: Negative for blurred vision and pain.       Corneal  Respiratory: Negative for cough.   Cardiovascular: Negative for chest pain and claudication.  Musculoskeletal: Positive for back pain.  Neurological: Negative for dizziness, focal weakness and seizures.  Endo/Heme/Allergies:       Breast augmentation  Psychiatric/Behavioral: Positive for depression. Negative for hallucinations and substance abuse.   Blood pressure 154/81, pulse 87, temperature 98.2 F (36.8 C), temperature source Oral, resp. rate 20, height 5\' 3"  (1.6 m), weight 58.5 kg (128 lb 15.5 oz), SpO2 95.00%. Physical Exam  Constitutional: She appears well-developed and well-nourished.  Eyes: Pupils are equal, round, and reactive to light.  Neck: Normal range of motion.  Respiratory: Effort normal.  GI: Soft.  Musculoskeletal:  Pain with hip ROM,  Pulses intact  Skin: Skin is warm and dry.  Psychiatric: She has a normal mood and affect.    Assessment/Plan:  Right femoral neck fracture , displaced.     Discussed with patient planned surgery tomorrow at about 4 PM ,  Risks of surgery , DVT, revision, reoperation and . dislocation all discussed. ?'s answered she agrees to proceed.   Chrystal Zeimet C 06/09/2013, 6:30 PM

## 2013-06-10 NOTE — Brief Op Note (Signed)
06/09/2013 - 06/10/2013  6:55 PM  PATIENT:  [name]  78 y.o. female  PRE-OPERATIVE DIAGNOSIS:  right femur neck fracture  POST-OPERATIVE DIAGNOSIS:  right femur neck fracture  PROCEDURE:  Procedure(s): ARTHROPLASTY MONOPOLAR HIP CEMENTED (Right)  SURGEON:  Surgeon(s) and Role:    Eldred Manges, MD - Primary  PHYSICIAN ASSISTANT:   ASSISTANTS: none   ANESTHESIA:   local and spinal  EBL:  Total I/O In: 2296.7 [I.V.:2296.7] Out: 2100 [Urine:1600; Blood:500]  BLOOD ADMINISTERED:none  DRAINS: none   LOCAL MEDICATIONS USED:  MARCAINE     SPECIMEN:  No Specimen  DISPOSITION OF SPECIMEN:  N/A  COUNTS:  YES  TOURNIQUET:  * No tourniquets in log *  DICTATION: .Other Dictation: Dictation Number [id number]  PLAN OF CARE: has admission orders   PATIENT DISPOSITION:  PACU - hemodynamically stable.   Delay start of Pharmacological VTE agent (>24hrs) due to surgical blood loss or risk of bleeding: yes

## 2013-06-10 NOTE — Progress Notes (Addendum)
Daughter, Hilda Lias, arrived around 1530 as patient was being prepared to go downstairs for surgery. Daughter expressed concerns regarding POA paperwork not being completed; paged Chaplain to let them know she was here. Explained to daughter that nurse had tried to call her regarding paperwork, however was unable to reach her. Pt does seem concerned regarding this matter. Will continue to try meet patient and families wishes

## 2013-06-10 NOTE — Progress Notes (Signed)
[facility] PROGRESS NOTE  [name]:[id number] DOB: Sep 16, 1934 DOA: 06/09/2013 PCP: [name], MD  Assessment/Plan: Principal Problem:   Hip fracture, right: Patient cleared from medical standpoint. An ORIF of for hip repair today. Active Problems:   HYPERLIPIDEMIA: Stable. Continue statin.    GERD: Stable. Continue PPI.    OSTEOARTHRITIS, KNEE, RIGHT   IRRITABLE BOWEL SYNDROME, HX OF   Hyponatremia: Likely from dehydration. IV fluids, sodium improved today.   Dehydration   GAD (generalized anxiety disorder): Stable.    Acute right hip pain: Secondary fracture.   Code Status: Full code Family Communication: Left message with family Disposition Plan: For skilled nursing facility, likely Monday   Consultants:  Yates-orthopedic surgery  Procedures:  For repair later this afternoon  Antibiotics:  None  HPI/Subjective: Patient seen prior to surgery. She is somewhat somnolent likely from pain medication. She does not appear to be in any acute distress.  Objective: Filed Vitals:   06/10/13 0658  BP: 160/80  Pulse: 94  Temp: 97.8 F (36.6 C)  Resp: 18    Intake/Output Summary (Last 24 hours) at 06/10/13 0901 Last data filed at 06/10/13 8119  Gross per 24 hour  Intake 2296.67 ml  Output   2900 ml  Net -603.33 ml   Filed Weights   06/09/13 1806  Weight: 58.5 kg (128 lb 15.5 oz)    Exam:   General:  Somnolent, no acute distress  Cardiovascular: Regular rate and rhythm, S1-S2  Respiratory: Clear to auscultation bilaterally  Abdomen: Soft, nontender, hypoactive bowel sounds  Musculoskeletal: No clubbing cyanosis or edema. Further exam deferred secondary to hip fracture   Data Reviewed: Basic Metabolic Panel:  Recent Labs Lab 06/09/13 1142 06/10/13 0525  NA 126* 129*  K 3.6 3.4*  CL 91* 95*  CO2 24 26  GLUCOSE 115* 138*  BUN 7 5*  CREATININE 0.67 0.65  CALCIUM 9.0 8.6   Liver Function Tests:  Recent Labs Lab  06/10/13 0525  AST 21  ALT 18  ALKPHOS 96  BILITOT 0.7  PROT 6.8  ALBUMIN 3.4*   No results found for this basename: LIPASE, AMYLASE,  in the last 168 hours No results found for this basename: AMMONIA,  in the last 168 hours CBC:  Recent Labs Lab 06/09/13 1142 06/10/13 0015 06/10/13 0525  WBC 14.3*  --  10.4  NEUTROABS 12.3*  --   --   HGB 11.4* 11.0* 11.4*  HCT 32.3* 31.2* 32.5*  MCV 86.8  --  88.1  PLT 274  --  263   Cardiac Enzymes:  Recent Labs Lab 06/09/13 1845  TROPONINI <0.30   BNP (last 3 results) No results found for this basename: PROBNP,  in the last 8760 hours CBG: No results found for this basename: GLUCAP,  in the last 168 hours  Recent Results (from the past 240 hour(s))  SURGICAL PCR SCREEN     Status: Abnormal   Collection Time    06/09/13  6:15 PM      Result Value Range Status   MRSA, PCR POSITIVE (*) NEGATIVE Final   Comment: RESULT CALLED TO, READ BACK BY AND VERIFIED WITH:     C YOUNG RN 2200 06/09/13 A NAVARRO   Staphylococcus aureus POSITIVE (*) NEGATIVE Final   Comment:            The Xpert SA Assay (FDA     approved for NASAL specimens     in patients over 13 years of age),  is one component of     a comprehensive surveillance     program.  Test performance has     been validated by [facility] for patients greater     than or equal to 81 year old.     It is not intended     to diagnose infection nor to     guide or monitor treatment.     Studies: Dg Chest 1 View  06/09/2013   CLINICAL DATA:  78 year old female status post fall with hip fracture. Preoperative study. Initial encounter.  EXAM: CHEST - 1 VIEW  COMPARISON:  07/30/2011.  FINDINGS: Portable AP semi upright view at a 1212 hrs. Lung volumes are stable and within normal limits. Normal cardiac size and mediastinal contours. Visualized tracheal air column is within normal limits. No pneumothorax, edema or pleural effusion. Incidental breast implants.  IMPRESSION:  No acute cardiopulmonary abnormality.   Electronically Signed   By: Augusto Gamble M.D.   On: 06/09/2013 12:27   Dg Hip Complete Right  06/09/2013   CLINICAL DATA:  78 year old female status post fall with right hip pain. Initial encounter.  EXAM: RIGHT HIP - COMPLETE 2+ VIEW  COMPARISON:  None.  FINDINGS: Impacted right femoral neck fracture with varus angulation. Right femoral head remains normally located. Intertrochanteric segment of the right femur intact. Pelvis intact. Postoperative changes to the lower lumbar spine. Proximal left femur grossly intact.  IMPRESSION: Acute right femoral neck fracture with varus impaction.   Electronically Signed   By: Augusto Gamble M.D.   On: 06/09/2013 12:27   Ct Head Wo Contrast  06/09/2013   CLINICAL DATA:  Fall.  EXAM: CT HEAD WITHOUT CONTRAST  CT CERVICAL SPINE WITHOUT CONTRAST  TECHNIQUE: Multidetector CT imaging of the head and cervical spine was performed following the standard protocol without intravenous contrast. Multiplanar CT image reconstructions of the cervical spine were also generated.  COMPARISON:  07/15/2012 brain MR. 03/22/2009 head CT and cervical spine CT.  FINDINGS: CT HEAD FINDINGS  No skull fracture or intracranial hemorrhage.  Mild small vessel disease type changes without CT evidence of large acute infarct.  Global atrophy.  No intracranial mass lesion noted on this unenhanced exam  CT CERVICAL SPINE FINDINGS  No cervical spine fracture detected.  Cervical alignment and degenerative changes relatively similar to the prior exam including transverse ligament hypertrophy. No abnormal prevertebral soft tissue swelling. If there is a high clinical suspicion of ligamentous injury, flexion and extension views or MR can be performed for further delineation.  Scarring lung apices  IMPRESSION: No skull fracture or intracranial hemorrhage.  No cervical spine fracture detected.  Please see above.   Electronically Signed   By: Bridgett Larsson M.D.   On: 06/09/2013  12:54   Ct Cervical Spine Wo Contrast  06/09/2013   CLINICAL DATA:  Fall.  EXAM: CT HEAD WITHOUT CONTRAST  CT CERVICAL SPINE WITHOUT CONTRAST  TECHNIQUE: Multidetector CT imaging of the head and cervical spine was performed following the standard protocol without intravenous contrast. Multiplanar CT image reconstructions of the cervical spine were also generated.  COMPARISON:  07/15/2012 brain MR. 03/22/2009 head CT and cervical spine CT.  FINDINGS: CT HEAD FINDINGS  No skull fracture or intracranial hemorrhage.  Mild small vessel disease type changes without CT evidence of large acute infarct.  Global atrophy.  No intracranial mass lesion noted on this unenhanced exam  CT CERVICAL SPINE FINDINGS  No cervical spine fracture detected.  Cervical  alignment and degenerative changes relatively similar to the prior exam including transverse ligament hypertrophy. No abnormal prevertebral soft tissue swelling. If there is a high clinical suspicion of ligamentous injury, flexion and extension views or MR can be performed for further delineation.  Scarring lung apices  IMPRESSION: No skull fracture or intracranial hemorrhage.  No cervical spine fracture detected.  Please see above.   Electronically Signed   By: Bridgett Larsson M.D.   On: 06/09/2013 12:54    Scheduled Meds: . aspirin EC  81 mg Oral Daily  . carvedilol  6.25 mg Oral BID WC  . Chlorhexidine Gluconate Cloth  6 each Topical Q0600  . dicyclomine  20 mg Oral TID  . escitalopram  20 mg Oral Daily  . heparin  5,000 Units Subcutaneous Q8H  . multivitamin with minerals  1 tablet Oral Daily  . mupirocin ointment  1 application Nasal BID  . pantoprazole  40 mg Oral Daily   Continuous Infusions: . sodium chloride 100 mL/hr at 06/09/13 2304    Principal Problem:   Hip fracture, right Active Problems:   HYPERLIPIDEMIA   GERD   OSTEOARTHRITIS, KNEE, RIGHT   IRRITABLE BOWEL SYNDROME, HX OF   Hyponatremia   Dehydration   GAD (generalized anxiety  disorder)   Acute right hip pain    Time spent: 15 min    Hollice Espy  [facility] Pager [phone]. If 7PM-7AM, please contact night-coverage at [email or web address], password [facility] 06/10/2013, 9:01 AM  LOS: 1 day

## 2013-06-10 NOTE — Interval H&P Note (Signed)
History and Physical Interval Note:  06/10/2013 4:50 PM  Link Snuffer  has presented today for surgery, with the diagnosis of right femur neck fracture  The various methods of treatment have been discussed with the patient and family. After consideration of risks, benefits and other options for treatment, the patient has consented to  Procedure(s): ARTHROPLASTY BIPOLAR HIP CEMENTED (Right) as a surgical intervention .  The patient's history has been reviewed, patient examined, no change in status, stable for surgery.  I have reviewed the patient's chart and labs.  Questions were answered to the patient's satisfaction.     YATES,MARK C

## 2013-06-10 NOTE — Progress Notes (Signed)
Chaplain notified of pt's desire for Advance Directive.  Pt asleep / lethargic.  Requested Chaplain return when daughter is present.  Nursing reports that daughter will be present at lunch.  Chaplain will follow up with pt and daughter to complete Adv. Directive prior to surgery.    Belva Crome MDiv

## 2013-06-10 NOTE — Anesthesia Preprocedure Evaluation (Addendum)
Anesthesia Evaluation  Patient identified by MRN, date of birth, ID band Patient awake    Reviewed: Allergy & Precautions, H&P , NPO status , Patient's Chart, lab work & pertinent test results  Airway Mallampati: II TM Distance: >3 FB Neck ROM: Full    Dental no notable dental hx.    Pulmonary neg pulmonary ROS,  breath sounds clear to auscultation  Pulmonary exam normal       Cardiovascular + CAD Rhythm:Regular Rate:Normal     Neuro/Psych PSYCHIATRIC DISORDERS Anxiety Depression negative neurological ROS  negative psych ROS   GI/Hepatic negative GI ROS, Neg liver ROS,   Endo/Other  negative endocrine ROS  Renal/GU negative Renal ROS  negative genitourinary   Musculoskeletal negative musculoskeletal ROS (+)   Abdominal   Peds negative pediatric ROS (+)  Hematology negative hematology ROS (+)   Anesthesia Other Findings   Reproductive/Obstetrics negative OB ROS                          Anesthesia Physical Anesthesia Plan  ASA: III  Anesthesia Plan: Spinal   Post-op Pain Management:    Induction:   Airway Management Planned: Simple Face Mask  Additional Equipment:   Intra-op Plan:   Post-operative Plan:   Informed Consent: I have reviewed the patients History and Physical, chart, labs and discussed the procedure including the risks, benefits and alternatives for the proposed anesthesia with the patient or authorized representative who has indicated his/her understanding and acceptance.   Dental advisory given  Plan Discussed with: CRNA  Anesthesia Plan Comments: (Long discussion with daughter, GA vs SAB.  Will do SAB.)        Anesthesia Quick Evaluation                                   Anesthesia Evaluation  Patient identified by MRN, date of birth, ID band Patient awake and Patient confused    Reviewed: Allergy & Precautions, H&P , NPO status , Patient's Chart,  lab work & pertinent test results, reviewed documented beta blocker date and time   Airway Mallampati: II TM Distance: <3 FB Neck ROM: full  Mouth opening: Limited Mouth Opening  Dental  (+) Dental Advidsory Given, Caps and Teeth Intact   Pulmonary neg pulmonary ROS, shortness of breath and with exertion,  clear to auscultation  Pulmonary exam normal       Cardiovascular + CAD and + DOE Regular Normal- Systolic murmurs    Neuro/Psych  Headaches, PSYCHIATRIC DISORDERS Anxiety    GI/Hepatic Neg liver ROS, GERD-  Medicated and Controlled,  Endo/Other  Negative Endocrine ROS  Renal/GU negative Renal ROS     Musculoskeletal   Abdominal   Peds  Hematology   Anesthesia Other Findings   Reproductive/Obstetrics                          Anesthesia Physical Anesthesia Plan  ASA: III  Anesthesia Plan: General   Post-op Pain Management:    Induction:   Airway Management Planned: Oral ETT  Additional Equipment:   Intra-op Plan:   Post-operative Plan:   Informed Consent: I have reviewed the patients History and Physical, chart, labs and discussed the procedure including the risks, benefits and alternatives for the proposed anesthesia with the patient or authorized representative who has indicated his/her understanding and acceptance.   Dental Advisory  Given  Plan Discussed with: CRNA, Anesthesiologist and Surgeon  Anesthesia Plan Comments:        Anesthesia Quick Evaluation

## 2013-06-10 NOTE — Anesthesia Procedure Notes (Signed)
Spinal Patient location during procedure: OR Staffing Anesthesiologist: Marx Doig Performed by: anesthesiologist  Preanesthetic Checklist Completed: patient identified, site marked, surgical consent, pre-op evaluation, timeout performed, IV checked, risks and benefits discussed and monitors and equipment checked Spinal Block Patient position: right lateral decubitus Prep: Betadine Patient monitoring: heart rate, continuous pulse ox and blood pressure Approach: left paramedian Location: L2-3 Injection technique: single-shot Needle Needle type: Spinocan  Needle gauge: 22 G Needle length: 9 cm Additional Notes Expiration date of kit checked and confirmed. Patient tolerated procedure well, without complications.     

## 2013-06-10 NOTE — Anesthesia Postprocedure Evaluation (Signed)
Anesthesia Post Note  Patient: [name]  Procedure(s) Performed: Procedure(s) (LRB): ARTHROPLASTY MONOPOLAR HIP CEMENTED (Right)  Anesthesia type: Spinal  Patient location: PACU  Post pain: Pain level controlled  Post assessment: Post-op Vital signs reviewed  Last Vitals:  Filed Vitals:   06/10/13 1337  BP: 137/76  Pulse: 76  Temp: 36.4 C  Resp: 18    Post vital signs: Reviewed  Level of consciousness: awake  Complications: No apparent anesthesia complications. Block has resolved.

## 2013-06-11 ENCOUNTER — Inpatient Hospital Stay (HOSPITAL_COMMUNITY): Payer: Medicare Other

## 2013-06-11 ENCOUNTER — Encounter (HOSPITAL_COMMUNITY): Payer: Self-pay | Admitting: Orthopaedic Surgery

## 2013-06-11 DIAGNOSIS — D62 Acute posthemorrhagic anemia: Secondary | ICD-10-CM

## 2013-06-11 LAB — CBC
HCT: 22.4 % — ABNORMAL LOW (ref 36.0–46.0)
MCHC: 35.3 g/dL (ref 30.0–36.0)
MCV: 88.2 fL (ref 78.0–100.0)
RBC: 2.54 MIL/uL — ABNORMAL LOW (ref 3.87–5.11)
RDW: 12.1 % (ref 11.5–15.5)

## 2013-06-11 LAB — BASIC METABOLIC PANEL
BUN: 6 mg/dL (ref 6–23)
Calcium: 8.1 mg/dL — ABNORMAL LOW (ref 8.4–10.5)
Chloride: 97 mEq/L (ref 96–112)
Creatinine, Ser: 0.66 mg/dL (ref 0.50–1.10)
GFR calc Af Amer: >90 mL/min (ref >90)
GFR calc non Af Amer: 84 mL/min — ABNORMAL LOW (ref >90)

## 2013-06-11 NOTE — Clinical Social Work Psychosocial (Signed)
     Clinical Social Work Department BRIEF PSYCHOSOCIAL ASSESSMENT 06/11/2013  Patient:  [name],[name]     Account Number:  [id number]     Admit date:  06/09/2013  Clinical Social Worker:  Hattie Perch  Date/Time:  06/11/2013 12:00 M  Referred by:  Physician  Date Referred:  06/11/2013 Referred for  SNF Placement   Other Referral:   Interview type:  Patient Other interview type:    PSYCHOSOCIAL DATA Living Status:  FAMILY Admitted from facility:   Level of care:   Primary support name:  [name] Primary support relationship to patient:  SPOUSE Degree of support available:   good    CURRENT CONCERNS Current Concerns  Post-Acute Placement   Other Concerns:    SOCIAL WORK ASSESSMENT / PLAN CSW met with patient. patient is alert and oriented X3. discussed need for snf. patient states that she has been at outpatient rehab before but never inpatient. she states that her husband is 74 and she knows that he cant take care of her. CSW discussed options. she lives in [place]. [facility].   Assessment/plan status:   Other assessment/ plan:   Information/referral to community resources:    PATIENTS/FAMILYS RESPONSE TO PLAN OF CARE: patient is feeling much better today than she was yesterday. She is very resolved to do rehab and be able to improve to go home to her husband.

## 2013-06-11 NOTE — Evaluation (Signed)
Physical Therapy Evaluation Patient Details Name: [name] MRN: [id number] DOB: 02-Sep-1934 Today's Date: 06/11/2013 Time: 4132-4401 PT Time Calculation (min): 15 min  PT Assessment / Plan / Recommendation History of Present Illness  78 yo female s/p R hip hemiarthroplasty 11/19. Hx of chronic LBP (back surgery), anemia, anxiety.   Clinical Impression  On eval, pt required +2 assist for mobility-able to stand and take a few side steps at EOB with walker. Pt was lethargic during session, but she was able to participate. Recommend SNF for continued rehab.    PT Assessment  Patient needs continued PT services    Follow Up Recommendations  SNF    Does the patient have the potential to tolerate intense rehabilitation      Barriers to Discharge        Equipment Recommendations  None recommended by PT    Recommendations for Other Services OT consult   Frequency Min 3X/week    Precautions / Restrictions Precautions Precautions: Posterior Hip;Fall Precaution Comments: Attempted to review hip precautions and WB status however pt was lethargic. Will need constant reminders likely.  Required Braces or Orthoses: Knee Immobilizer - Right Knee Immobilizer - Right: Other (comment) (in bed) Restrictions Weight Bearing Restrictions: No RLE Weight Bearing: Weight bearing as tolerated   Pertinent Vitals/Pain R hip, back-unrated (pt unable to rate on scale)      Mobility  Bed Mobility Bed Mobility: Supine to Sit;Sit to Supine Supine to Sit: 1: +2 Total assist Supine to Sit: Patient Percentage: 30% Sit to Supine: 1: +2 Total assist Sit to Supine: Patient Percentage: 30% Details for Bed Mobility Assistance: Increased assistance due to lethargy/drowsiness. Assist for bil LEs and trunk. Utilized bedpad to aid with scooting, positioning.  Transfers Transfers: Sit to Stand;Stand to Sit Sit to Stand: 1: +2 Total assist;From bed;From elevated surface Sit to Stand: Patient Percentage:  30% Stand to Sit: 1: +2 Total assist;To bed;To elevated surface Stand to Sit: Patient Percentage: 30% Details for Transfer Assistance: Assist to rise, stabilize, control descent. 2 attempts to get to standing. Multimodal cues for safety, technique, hand placement on walker, LE positioning.  Ambulation/Gait Ambulation/Gait Assistance: 1: +2 Total assist Ambulation/Gait: Patient Percentage: 40% Assistive device: Rolling walker Ambulation/Gait Assistance Details: 3-4 side steps to [facility] with RW.  Gait Pattern: Step-to pattern;Antalgic;Trunk flexed    Exercises     PT Diagnosis: Difficulty walking;Abnormality of gait;Generalized weakness;Acute pain  PT Problem List: Decreased strength;Decreased range of motion;Decreased activity tolerance;Decreased balance;Decreased mobility;Pain;Decreased knowledge of precautions;Decreased knowledge of use of DME PT Treatment Interventions: DME instruction;Gait training;Functional mobility training;Therapeutic activities;Therapeutic exercise;Patient/family education;Balance training     PT Goals(Current goals can be found in the care plan section) Acute Rehab PT Goals Patient Stated Goal: none stated PT Goal Formulation: With patient Time For Goal Achievement: 06/25/13 Potential to Achieve Goals: Good  Visit Information  Last PT Received On: 06/11/13 Assistance Needed: +2 History of Present Illness: 78 yo female s/p R hip hemiarthroplasty 11/19. Hx of chronic LBP (back surgery), anemia, anxiety.        Prior Functioning  Home Living Family/patient expects to be discharged to:: Skilled nursing facility Living Arrangements: Spouse/significant other Type of Home: House Home Access: Stairs to enter Entrance Stairs-Number of Steps: 3 Home Layout: One level Home Equipment: Walker - 2 wheels;Cane - single point Additional Comments: PLOF taken from chart Communication Communication: Expressive difficulties (pt's speech somewhat slurred-likely due to  lethargy ? meds)    Cognition  Cognition Arousal/Alertness: Lethargic;Suspect due to medications Behavior  During Therapy: Flat affect Overall Cognitive Status: Difficult to assess Memory: Decreased recall of precautions Difficult to assess due to: Level of arousal    Extremity/Trunk Assessment Upper Extremity Assessment Upper Extremity Assessment: Defer to OT evaluation Lower Extremity Assessment Lower Extremity Assessment: Difficult to assess due to impaired cognition;RLE deficits/detail;LLE deficits/detail RLE Deficits / Details: left in KI for session. Pt able to weightbear partially LLE Deficits / Details: Pt able to weightbear   Balance Balance Balance Assessed: Yes Static Sitting Balance Static Sitting - Balance Support: Bilateral upper extremity supported;Feet supported Static Sitting - Level of Assistance: 5: Stand by assistance Static Standing Balance Static Standing - Balance Support: Bilateral upper extremity supported Static Standing - Level of Assistance: 1: +2 Total assist Dynamic Standing Balance Dynamic Standing - Balance Support: Bilateral upper extremity supported Dynamic Standing - Level of Assistance: 1: +2 Total assist  End of Session PT - End of Session Activity Tolerance: Patient limited by lethargy;Patient limited by pain;Patient limited by fatigue Patient left: in bed;with call bell/phone within reach  GP     Rebeca Alert, MPT Pager: [phone]

## 2013-06-11 NOTE — Progress Notes (Addendum)
2300: Patient unable to spontaneously voidx2. Bladder scanned 450 cc. Order obtained for straight catheterization. 400cc urine out.   0330: Patient voided 300cc spontaneously.

## 2013-06-11 NOTE — Progress Notes (Addendum)
Subjective: 1 Day Post-Op Procedure(s) (LRB): ARTHROPLASTY MONOPOLAR HIP CEMENTED (Right) Patient reports pain as mild.    Objective: Vital signs in last 24 hours: Temp:  [97.7 F (36.5 C)-99 F (37.2 C)] 97.9 F (36.6 C) (11/20 1354) Pulse Rate:  [69-89] 79 (11/20 1354) Resp:  [13-23] 18 (11/20 1354) BP: (90-127)/(50-102) 100/63 mmHg (11/20 1354) SpO2:  [86 %-100 %] 96 % (11/20 1354)  Intake/Output from previous day: 11/19 0701 - 11/20 0700 In: 3420.4 [P.O.:240; I.V.:3180.4] Out: 2375 [Urine:1875; Blood:500] Intake/Output this shift: Total I/O In: 120 [P.O.:120] Out: -    Recent Labs  06/09/13 1142 06/10/13 0015 06/10/13 0525 06/10/13 1946 06/11/13 0500  HGB 11.4* 11.0* 11.4* 9.8* 7.9*    Recent Labs  06/10/13 0525 06/10/13 1946 06/11/13 0500  WBC 10.4  --  9.5  RBC 3.69*  --  2.54*  HCT 32.5* 28.1* 22.4*  PLT 263  --  176    Recent Labs  06/10/13 0525 06/11/13 0500  NA 129* 132*  K 3.4* 3.5  CL 95* 97  CO2 26 27  BUN 5* 6  CREATININE 0.65 0.66  GLUCOSE 138* 148*  CALCIUM 8.6 8.1*    Recent Labs  06/09/13 1142  INR 1.06    Neurologically intact  Assessment/Plan: 1 Day Post-Op Procedure(s) (LRB): ARTHROPLASTY MONOPOLAR HIP CEMENTED (Right) Up with therapy,  Foley out. On bedpan  2 plus assist with PT        Hgb dropped, will not transfuse unless symptomatic or drops below 6.7.  Should rebound in 2 days.  If not progressing with PT due to weakness will reconsider transfusion. Discussed with patient and daughter.  Continue PT. Dressing dry and dressing change ordered. D/C knee immobilizer  Dawsen Krieger C 06/11/2013, 6:28 PM

## 2013-06-11 NOTE — Progress Notes (Signed)
Patient's daughter, Amerika Nourse, phoned to express concern about her mothers care.  We discussed her concerns, offered to have Dr. Rito Ehrlich to phone her to speak about her mother but she declined.  She also would like a advance directive completed before her mothers discharge.

## 2013-06-11 NOTE — Progress Notes (Addendum)
[facility] PROGRESS NOTE  [name] ZOX:[id number] DOB: Jan 29, 1935 DOA: 06/09/2013 PCP: [name], MD  Assessment/Plan: Principal Problem:   Hip fracture, right: Patient cleared from medical standpoint. Patient underwent successful repair of hip fracture yesterday. Noted acute blood loss anemia. Recheck levels tomorrow and transfuse if below 7. Pain controlled oral medications Active Problems:   HYPERLIPIDEMIA: Stable. Continue statin.    GERD: Stable. Continue PPI.    OSTEOARTHRITIS, KNEE, RIGHT    IRRITABLE BOWEL SYNDROME, HX OF   Severe protein calorie malnutrition:Pt meets criteria for severe MALNUTRITION in the context of chronic illness as evidenced by <75% estimated energy intake for the past 3 weeks in addition to pt with severe muscle wasting and subcutaneous fat loss in hands.    Hyponatremia: Likely from dehydration. IV fluids, sodium continues to improve  Dehydration    GAD (generalized anxiety disorder): Stable.    Acute right hip pain: Secondary fracture and repair. See above.   Code Status: Full code Family Communication: Left message with family Disposition Plan: For skilled nursing facility, likely Monday   Consultants:  [facility]-orthopedic surgery  Procedures:  Status post ORIF done on 11/19  Antibiotics:  None  HPI/Subjective: Patient doing okay. Fatigued. When she wakes up, she is having pain in her right hip at site of repair  Objective: Filed Vitals:   06/11/13 0634  BP: 97/66  Pulse: 69  Temp: 98.8 F (37.1 C)  Resp: 16    Intake/Output Summary (Last 24 hours) at 06/11/13 0930 Last data filed at 06/11/13 8119  Gross per 24 hour  Intake 2123.75 ml  Output   2375 ml  Net -251.25 ml   Filed Weights   06/09/13 1806  Weight: 58.5 kg (128 lb 15.5 oz)    Exam:   General:  Somnolent, mild distress from pain  Cardiovascular: Regular rate and rhythm, S1-S2  Respiratory: Clear to auscultation bilaterally  Abdomen:  Soft, nontender, hypoactive bowel sounds  Musculoskeletal: No clubbing cyanosis or edema. Further exam deferred secondary to hip fracture   Data Reviewed: Basic Metabolic Panel:  Recent Labs Lab 06/09/13 1142 06/10/13 0525 06/11/13 0500  NA 126* 129* 132*  K 3.6 3.4* 3.5  CL 91* 95* 97  CO2 24 26 27   GLUCOSE 115* 138* 148*  BUN 7 5* 6  CREATININE 0.67 0.65 0.66  CALCIUM 9.0 8.6 8.1*   Liver Function Tests:  Recent Labs Lab 06/10/13 0525  AST 21  ALT 18  ALKPHOS 96  BILITOT 0.7  PROT 6.8  ALBUMIN 3.4*   No results found for this basename: LIPASE, AMYLASE,  in the last 168 hours No results found for this basename: AMMONIA,  in the last 168 hours CBC:  Recent Labs Lab 06/09/13 1142 06/10/13 0015 06/10/13 0525 06/10/13 1946 06/11/13 0500  WBC 14.3*  --  10.4  --  9.5  NEUTROABS 12.3*  --   --   --   --   HGB 11.4* 11.0* 11.4* 9.8* 7.9*  HCT 32.3* 31.2* 32.5* 28.1* 22.4*  MCV 86.8  --  88.1  --  88.2  PLT 274  --  263  --  176   Cardiac Enzymes:  Recent Labs Lab 06/09/13 1845  TROPONINI <0.30   BNP (last 3 results) No results found for this basename: PROBNP,  in the last 8760 hours CBG: No results found for this basename: GLUCAP,  in the last 168 hours  Recent Results (from the past 240 hour(s))  SURGICAL PCR SCREEN  Status: Abnormal   Collection Time    06/09/13  6:15 PM      Result Value Range Status   MRSA, PCR POSITIVE (*) NEGATIVE Final   Comment: RESULT CALLED TO, READ BACK BY AND VERIFIED WITH:     C YOUNG RN 2200 06/09/13 A NAVARRO   Staphylococcus aureus POSITIVE (*) NEGATIVE Final   Comment:            The Xpert SA Assay (FDA     approved for NASAL specimens     in patients over 21 years of age),     is one component of     a comprehensive surveillance     program.  Test performance has     been validated by [employer] for patients greater     than or equal to 42 year old.     It is not intended     to diagnose infection  nor to     guide or monitor treatment.     Studies: Dg Chest 1 View  06/09/2013   CLINICAL DATA:  78 year old female status post fall with hip fracture. Preoperative study. Initial encounter.  EXAM: CHEST - 1 VIEW  COMPARISON:  07/30/2011.  FINDINGS: Portable AP semi upright view at a 1212 hrs. Lung volumes are stable and within normal limits. Normal cardiac size and mediastinal contours. Visualized tracheal air column is within normal limits. No pneumothorax, edema or pleural effusion. Incidental breast implants.  IMPRESSION: No acute cardiopulmonary abnormality.   Electronically Signed   By: Augusto Gamble M.D.   On: 06/09/2013 12:27   Dg Hip Complete Right  06/09/2013   CLINICAL DATA:  78 year old female status post fall with right hip pain. Initial encounter.  EXAM: RIGHT HIP - COMPLETE 2+ VIEW  COMPARISON:  None.  FINDINGS: Impacted right femoral neck fracture with varus angulation. Right femoral head remains normally located. Intertrochanteric segment of the right femur intact. Pelvis intact. Postoperative changes to the lower lumbar spine. Proximal left femur grossly intact.  IMPRESSION: Acute right femoral neck fracture with varus impaction.   Electronically Signed   By: Augusto Gamble M.D.   On: 06/09/2013 12:27   Ct Head Wo Contrast  06/09/2013   CLINICAL DATA:  Fall.  EXAM: CT HEAD WITHOUT CONTRAST  CT CERVICAL SPINE WITHOUT CONTRAST  TECHNIQUE: Multidetector CT imaging of the head and cervical spine was performed following the standard protocol without intravenous contrast. Multiplanar CT image reconstructions of the cervical spine were also generated.  COMPARISON:  07/15/2012 brain MR. 03/22/2009 head CT and cervical spine CT.  FINDINGS: CT HEAD FINDINGS  No skull fracture or intracranial hemorrhage.  Mild small vessel disease type changes without CT evidence of large acute infarct.  Global atrophy.  No intracranial mass lesion noted on this unenhanced exam  CT CERVICAL SPINE FINDINGS  No cervical  spine fracture detected.  Cervical alignment and degenerative changes relatively similar to the prior exam including transverse ligament hypertrophy. No abnormal prevertebral soft tissue swelling. If there is a high clinical suspicion of ligamentous injury, flexion and extension views or MR can be performed for further delineation.  Scarring lung apices  IMPRESSION: No skull fracture or intracranial hemorrhage.  No cervical spine fracture detected.  Please see above.   Electronically Signed   By: Bridgett Larsson M.D.   On: 06/09/2013 12:54   Ct Cervical Spine Wo Contrast  06/09/2013   CLINICAL DATA:  Fall.  EXAM: CT HEAD WITHOUT  CONTRAST  CT CERVICAL SPINE WITHOUT CONTRAST  TECHNIQUE: Multidetector CT imaging of the head and cervical spine was performed following the standard protocol without intravenous contrast. Multiplanar CT image reconstructions of the cervical spine were also generated.  COMPARISON:  07/15/2012 brain MR. 03/22/2009 head CT and cervical spine CT.  FINDINGS: CT HEAD FINDINGS  No skull fracture or intracranial hemorrhage.  Mild small vessel disease type changes without CT evidence of large acute infarct.  Global atrophy.  No intracranial mass lesion noted on this unenhanced exam  CT CERVICAL SPINE FINDINGS  No cervical spine fracture detected.  Cervical alignment and degenerative changes relatively similar to the prior exam including transverse ligament hypertrophy. No abnormal prevertebral soft tissue swelling. If there is a high clinical suspicion of ligamentous injury, flexion and extension views or MR can be performed for further delineation.  Scarring lung apices  IMPRESSION: No skull fracture or intracranial hemorrhage.  No cervical spine fracture detected.  Please see above.   Electronically Signed   By: Bridgett Larsson M.D.   On: 06/09/2013 12:54   Dg Pelvis Portable  06/11/2013   CLINICAL DATA:  Postoperative total hip replacement  EXAM: PORTABLE PELVIS 1-2 VIEWS  COMPARISON:  None.   FINDINGS: There is a total hip prosthesis on the right which appears well seated. No fracture or dislocation apparent. There is moderate narrowing of the left hip joint.  IMPRESSION: Total hip prosthesis on the right appears well seated. No fracture or dislocation apparent.   Electronically Signed   By: Bretta Bang M.D.   On: 06/11/2013 08:08    Scheduled Meds: . acetaminophen  1,000 mg Oral Q6H  . aspirin EC  325 mg Oral Q breakfast  . carvedilol  6.25 mg Oral BID WC  . Chlorhexidine Gluconate Cloth  6 each Topical Q0600  . dicyclomine  20 mg Oral TID  . docusate sodium  100 mg Oral BID  . escitalopram  20 mg Oral Daily  . escitalopram  20 mg Oral Daily  . multivitamin with minerals  1 tablet Oral Daily  . mupirocin ointment  1 application Nasal BID  . pantoprazole  40 mg Oral Daily  . vancomycin  1,000 mg Intravenous Once   Continuous Infusions: . sodium chloride 100 mL/hr at 06/09/13 2304  . 0.9 % NaCl with KCl 20 mEq / L 75 mL/hr at 06/10/13 2133    Principal Problem:   Hip fracture, right Active Problems:   HYPERLIPIDEMIA   GERD   OSTEOARTHRITIS, KNEE, RIGHT   IRRITABLE BOWEL SYNDROME, HX OF   Hyponatremia   Dehydration   GAD (generalized anxiety disorder)   Acute right hip pain   Protein-calorie malnutrition, severe    Time spent: 20 min    Hollice Espy  [facility] Pager [phone]. If 7PM-7AM, please contact night-coverage at [email or web address], password [facility] 06/11/2013, 9:30 AM  LOS: 2 days

## 2013-06-11 NOTE — Clinical Documentation Improvement (Signed)
INITIAL NUTRITION ASSESSMENT  06/10/2013  Possible Clinical Conditions?  Severe Malnutrition   Protein Calorie Malnutrition Severe Protein Calorie Malnutrition   Other Condition Cannot clinically determine  Supporting Information: Pt meets criteria for severe MALNUTRITION in the context of chronic illness as evidenced by <75% estimated energy intake for the past 3 weeks in addition to pt with severe muscle wasting and subcutaneous fat loss in hands  Treatment:INTERVENTION:  - Diet advancement per MD  - Will continue to monitor  Weights, labs, diet advancement    Thank You, Andy Gauss ,RN Clinical Documentation Specialist:  [phone]  [facility]- Health Information Management

## 2013-06-11 NOTE — Progress Notes (Signed)
Attempted to meet with pt to discuss d/c planning, she was very sleepy and unable to stay awake to hold conversation. I will see her again in the AM.  Algernon Huxley, RN BS   [phone]

## 2013-06-11 NOTE — Op Note (Signed)
NAMEHENNESSY, [name]                  ACCOUNT NO.:  [id number]  MEDICAL RECORD NO.:  [id number]  LOCATION:  1505                         FACILITY:  [facility]  PHYSICIAN:  Shanavia Makela C. Ophelia Charter, M.D.    DATE OF BIRTH:  August 08, 1934  DATE OF PROCEDURE:  06/10/2013 DATE OF DISCHARGE:                              OPERATIVE REPORT   PREOPERATIVE DIAGNOSIS:  Right displaced femoral neck fracture.  POSTOPERATIVE DIAGNOSIS:  Right displaced femoral neck fracture.  PROCEDURE:  DePuy cemented #4 hemiarthroplasty stem with +5 neck, 44 mm monopolar ball (right hip hemiarthroplasty).  SURGEON:  Guillermo Nehring C. Ophelia Charter, M.D.  ANESTHESIA:  Spinal plus local.  DRAINS:  None.  ESTIMATED BLOOD LOSS:  450 mL.  INDICATIONS:  This 78 year old female fell with displaced right femoral neck fracture.  She had received some heparin today this afternoon at 1:50, had also been on aspirin and on SCD yesterday for __________ DVT prophylaxis.  PROCEDURE IN DETAIL:  Hanving obtained informed consent after spinal, patient placed in lateral position, right hip up, standard prepping and draping, _lateral position with_________ axillary roll.  PCR test was positive for MRSA.  So, the patient had been given Ancef as ordered for gram-negative coverage but also got vancomycin 1 g.  DuraPrep was used, the usual half sheets, sticky blue U-shaped split sheets, impervious stockinette, Coban, and paper split sheets were used. Sterile skin marker, Betadine, Steri-Drape x2 sealing the skin.  Time- out procedure completed.  Posterior approach was made.  Gluteus maximus was split.  Charnley retractor placed.  Piriformis was tagged.  Repaired at the end of the case to the gluteus medius tendon.  Posterior capsule was opened with the piriformis tendon taged with 0 Ethibond suture. __________.  Femoral neck fracture was observed, neck was cut fingerbreadth above the lesser trochanter with an oscillating saw, appropriate angle.  Bone fragments were  removed.  Head was removed with the corkscrew, and measured 44 mm sized.  Trial gave good suction fit.  Canal preparation, lateralizer __________ trochanteric reamer and then sequential broaching up _to size 4 with_________nice tight fit.  Distal cement restrictor was placed.  After __bottle brush reaming cement 4 stem ________ was opened, then a 12 mm centralizer was applied at the tip of the stem.  The pulse lavage, vacuum mixing cement, impaction with a glue gun, placement of the stem, appropriate version, held for 15 minutes while the cement was on hand and it hardened.  The trials had been done with the broach and +5 ball was popped on.  There was a flexion 80 degrees, adduction, internal rotation 90 degrees, and the hip was stable.  Trace shuck, no quad tightness.  Repeat irrigation, repair of the piriformis, gluteus medius tendon.  Tensor fascia closure with #1 Ethibond, #1 Vicryl for the gluteus maximus fascia, 2-0 Vicryl subcutaneous tissue, skin staple closure, postop dressing and knee immobilizer.  Instrument count and needle count was correct.     Bow Buntyn C. Ophelia Charter, M.D.     MCY/MEDQ  D:  06/10/2013  T:  06/11/2013  Job:  [id number]

## 2013-06-12 DIAGNOSIS — E785 Hyperlipidemia, unspecified: Secondary | ICD-10-CM

## 2013-06-12 DIAGNOSIS — S72009D Fracture of unspecified part of neck of unspecified femur, subsequent encounter for closed fracture with routine healing: Secondary | ICD-10-CM

## 2013-06-12 DIAGNOSIS — E43 Unspecified severe protein-calorie malnutrition: Secondary | ICD-10-CM

## 2013-06-12 LAB — BASIC METABOLIC PANEL
CO2: 27 mEq/L (ref 19–32)
Calcium: 8.1 mg/dL — ABNORMAL LOW (ref 8.4–10.5)
Creatinine, Ser: 0.66 mg/dL (ref 0.50–1.10)
GFR calc non Af Amer: 84 mL/min — ABNORMAL LOW (ref >90)
Potassium: 3.8 mEq/L (ref 3.5–5.1)
Sodium: 134 mEq/L — ABNORMAL LOW (ref 135–145)

## 2013-06-12 LAB — CBC
MCH: 30.6 pg (ref 26.0–34.0)
MCHC: 34.5 g/dL (ref 30.0–36.0)
Platelets: 186 10*3/uL (ref 150–400)
RBC: 2.32 MIL/uL — ABNORMAL LOW (ref 3.87–5.11)

## 2013-06-12 NOTE — Progress Notes (Signed)
Pt states that she does not want [facility] services.  She said that she would prefer [facility] in [place].

## 2013-06-12 NOTE — Progress Notes (Signed)
Physical Therapy Treatment Patient Details Name: [name] MRN: [id number] DOB: October 25, 1934 Today's Date: 06/12/2013 Time: 1324-4010 PT Time Calculation (min): 25 min  PT Assessment / Plan / Recommendation  History of Present Illness 78 yo female s/p R hip hemiarthroplasty 11/19. Hx of chronic LBP (back surgery), anemia, anxiety.    PT Comments   Progressing slowly with mobility. Noted leg length discrepancy this session, R longer than L. Reviewed precautions with pt and left handout hanging in room and instructions on white board as well.   Follow Up Recommendations  SNF     Does the patient have the potential to tolerate intense rehabilitation     Barriers to Discharge        Equipment Recommendations  None recommended by PT    Recommendations for Other Services OT consult  Frequency Min 3X/week   Progress towards PT Goals Progress towards PT goals: Progressing toward goals (slowly)  Plan Current plan remains appropriate    Precautions / Restrictions Precautions Precautions: Posterior Hip;Fall Precaution Comments: Attempted to review hip precautions and WB status however pt was lethargic. Will need constant reminders likely.  Required Braces or Orthoses: Knee Immobilizer - Right (MD dc/d but pt may need to wear in bed to adhere to precautions) Restrictions Weight Bearing Restrictions: No RLE Weight Bearing: Weight bearing as tolerated   Pertinent Vitals/Pain R LE with activity. (pt unable to rate using scale)   Mobility  Bed Mobility Bed Mobility: Supine to Sit Supine to Sit: 1: +2 Total assist Supine to Sit: Patient Percentage: 50% Details for Bed Mobility Assistance: Assist for trunk and R LE. Increased time. VCS safety, technique, adherence to hip precautions. Utilized bedpad to aid with scooting, positioning.  Transfers Transfers: Sit to Stand;Stand to Sit Sit to Stand: 1: +2 Total assist Sit to Stand: Patient Percentage: 50% Stand to Sit: 1: +2 Total  assist Stand to Sit: Patient Percentage: 50% Details for Transfer Assistance: Assist to rise, stabilize, control descent. VCS safety, technique, R LE positioning.  Ambulation/Gait Ambulation/Gait Assistance: 1: +2 Total assist Ambulation/Gait: Patient Percentage: 70% Ambulation Distance (Feet): 5 Feet Assistive device: Rolling walker Ambulation/Gait Assistance Details: Assist to stabilize throughout ambulation. vCS safety, technique, sequence, step length. Pt c/o "feeling like I'm going to pass out". Followed with recliner.  Gait Pattern: Step-to pattern;Antalgic;Trunk flexed;Decreased stride length    Exercises     PT Diagnosis:    PT Problem List:   PT Treatment Interventions:     PT Goals (current goals can now be found in the care plan section)    Visit Information  Last PT Received On: 06/12/13 Assistance Needed: +2 History of Present Illness: 78 yo female s/p R hip hemiarthroplasty 11/19. Hx of chronic LBP (back surgery), anemia, anxiety.     Subjective Data      Cognition  Cognition Arousal/Alertness: Lethargic;Suspect due to medications Behavior During Therapy: [facility] for tasks assessed/performed Overall Cognitive Status: Impaired/Different from baseline Area of Impairment: Memory;Attention Current Attention Level: Selective Memory: Decreased recall of precautions Difficult to assess due to: Level of arousal    Balance  Balance Balance Assessed: Yes Static Sitting Balance Static Sitting - Balance Support: Bilateral upper extremity supported;Feet supported Static Sitting - Level of Assistance: 5: Stand by assistance Static Standing Balance Static Standing - Balance Support: Bilateral upper extremity supported Static Standing - Level of Assistance: 1: +2 Total assist Dynamic Standing Balance Dynamic Standing - Balance Support: Bilateral upper extremity supported Dynamic Standing - Level of Assistance: 1: +2 Total assist  End of Session PT - End of  Session Equipment Utilized During Treatment: Gait belt Activity Tolerance: Patient limited by fatigue;Patient limited by pain (Limited by dizziness) Patient left: in chair;with call bell/phone within reach Nurse Communication: Mobility status;Precautions;Weight bearing status   GP     Rebeca Alert, MPT Pager: [phone]

## 2013-06-12 NOTE — Evaluation (Addendum)
Occupational Therapy Evaluation Patient Details Name: [name] MRN: [id number] DOB: 09/03/1934 Today's Date: 06/12/2013 Time: 4098-1191 OT Time Calculation (min): 25 min  OT Assessment / Plan / Recommendation History of present illness 78 yo female s/p R hip hemiarthroplasty 11/19. Hx of chronic LBP (back surgery), anemia, anxiety.    Clinical Impression   Pt is limited by some lethargy which impacts her ability to recall and follow/apply hip precautions during ADL. She needs +2 assist for sit to stand level ADL. She will benefit from skilled OT services to maximize ADL independence for next venue.   OT Assessment  Patient needs continued OT Services    Follow Up Recommendations  SNF;Supervision/Assistance - 24 hour    Barriers to Discharge      Equipment Recommendations   (not able to determine DME owned by pt)    Recommendations for Other Services    Frequency  Min 2X/week    Precautions / Restrictions Precautions Precautions: Posterior Hip;Fall Precaution Comments: Attempted to review hip precautions and WB status however pt was lethargic. Will need constant reminders likely.  Required Braces or Orthoses: Knee Immobilizer - Right Knee Immobilizer - Right: Other (comment) Restrictions Weight Bearing Restrictions: No RLE Weight Bearing: Weight bearing as tolerated   Pertinent Vitals/Pain Pt didn't rate pain; R LE: reposition; tolerated up to take a few steps and sit in recliner.    ADL  Eating/Feeding: Independent Where Assessed - Eating/Feeding: Chair Grooming: Wash/dry hands;Set up Where Assessed - Grooming: Supported sitting Upper Body Bathing: Chest;Right arm;Left arm;Abdomen;Minimal assistance Where Assessed - Upper Body Bathing: Unsupported sitting Lower Body Bathing: +2 Total assistance Lower Body Bathing: Patient Percentage: 30% Where Assessed - Lower Body Bathing: Supported sit to stand Upper Body Dressing: Minimal assistance Where Assessed - Upper Body  Dressing: Unsupported sitting Lower Body Dressing: +2 Total assistance Lower Body Dressing: Patient Percentage: 10% Where Assessed - Lower Body Dressing: Supported sit to stand Toilet Transfer: +2 Total assistance Toilet Transfer: Patient Percentage: 50% (pt 70% to take a few steps, chair pulled up) Toilet Transfer Method: Sit to stand Toileting - Clothing Manipulation and Hygiene: +2 Total assistance Toileting - Clothing Manipulation and Hygiene: Patient Percentage: 0% Where Assessed - Toileting Clothing Manipulation and Hygiene: Sit to stand from 3-in-1 or toilet Equipment Used: Rolling walker ADL Comments: Pt lethargic and confused so needs assist and constant verbal cues to adhere to hip precautions. Not able to introduce AE today due to decreased alertness. Placed blanket roll between legs to help with R foot not rolling inward.     OT Diagnosis: Generalized weakness;Cognitive deficits  OT Problem List: Decreased strength;Decreased knowledge of use of DME or AE;Decreased cognition;Decreased knowledge of precautions OT Treatment Interventions: Self-care/ADL training;DME and/or AE instruction;Therapeutic activities;Patient/family education   OT Goals(Current goals can be found in the care plan section) Acute Rehab OT Goals Patient Stated Goal: none stated OT Goal Formulation: Patient unable to participate in goal setting Time For Goal Achievement: 06/19/13 Potential to Achieve Goals: Good  Visit Information  Last OT Received On: 06/12/13 Assistance Needed: +2 PT/OT Co-Evaluation/Treatment: Yes History of Present Illness: 78 yo female s/p R hip hemiarthroplasty 11/19. Hx of chronic LBP (back surgery), anemia, anxiety.        Prior Functioning     Home Living Family/patient expects to be discharged to:: Skilled nursing facility Living Arrangements: Spouse/significant other Type of Home: House Home Access: Stairs to enter Entrance Stairs-Number of Steps: 3 Home Layout: One  level Home Equipment: Walker - 2 wheels;Cane -  single point Additional Comments: PLOF taken from chart Prior Function Comments: unable to determine details from pt regarding PLOF due to pt lethargic /confused Communication Communication: Expressive difficulties (pt's speech somewhat slurred-likely due to lethargy ? meds)         Vision/Perception     Cognition  Cognition Arousal/Alertness: Lethargic;Suspect due to medications Behavior During Therapy: [facility] for tasks assessed/performed Overall Cognitive Status: Impaired/Different from baseline Area of Impairment: Memory;Attention;Safety/judgement Current Attention Level: Selective Memory: Decreased recall of precautions Difficult to assess due to: Level of arousal    Extremity/Trunk Assessment Upper Extremity Assessment Upper Extremity Assessment: Generalized weakness     Mobility Bed Mobility Bed Mobility: Supine to Sit Supine to Sit: 1: +2 Total assist Supine to Sit: Patient Percentage: 50% Details for Bed Mobility Assistance: Assist for trunk and R LE. Increased time. VCS safety, technique, adherence to hip precautions. Utilized bedpad to aid with scooting, positioning.  Transfers Sit to Stand: 1: +2 Total assist Sit to Stand: Patient Percentage: 50% Stand to Sit: 1: +2 Total assist Stand to Sit: Patient Percentage: 50% Details for Transfer Assistance: Assist to rise, stabilize, control descent. VCS safety, technique, R LE positioning.      Exercise     Balance Balance Balance Assessed: Yes Static Sitting Balance Static Sitting - Balance Support: Bilateral upper extremity supported;Feet supported Static Sitting - Level of Assistance: 5: Stand by assistance Static Standing Balance Static Standing - Balance Support: Bilateral upper extremity supported Static Standing - Level of Assistance: 1: +2 Total assist Dynamic Standing Balance Dynamic Standing - Balance Support: Bilateral upper extremity supported Dynamic  Standing - Level of Assistance: 1: +2 Total assist   End of Session OT - End of Session Equipment Utilized During Treatment: Gait belt;Rolling walker Activity Tolerance: Other (comment) (pt states alittle dizzy at end of session) Patient left: in chair;with call bell/phone within reach  GO     Lennox Laity [phone] 06/12/2013, 12:15 PM

## 2013-06-12 NOTE — Progress Notes (Signed)
Subjective: 2 Days Post-Op Procedure(s) (LRB): ARTHROPLASTY MONOPOLAR HIP CEMENTED (Right) Patient reports pain as moderate.   Sitting in chair.  Little ambulation. Pain well controlled.  Objective: Vital signs in last 24 hours: Temp:  [97.2 F (36.2 C)-98.9 F (37.2 C)] 98.2 F (36.8 C) (11/21 0559) Pulse Rate:  [79-108] 104 (11/21 0821) Resp:  [15-18] 16 (11/21 1158) BP: (100-132)/(63-72) 132/72 mmHg (11/21 0821) SpO2:  [92 %-100 %] 92 % (11/21 1158)  Intake/Output from previous day: 11/20 0701 - 11/21 0700 In: 2240 [P.O.:240; I.V.:2000] Out: 1050 [Urine:1050] Intake/Output this shift: Total I/O In: -  Out: 250 [Urine:250]   Recent Labs  06/10/13 0015 06/10/13 0525 06/10/13 1946 06/11/13 0500 06/12/13 0520  HGB 11.0* 11.4* 9.8* 7.9* 7.1*    Recent Labs  06/11/13 0500 06/12/13 0520  WBC 9.5 9.5  RBC 2.54* 2.32*  HCT 22.4* 20.6*  PLT 176 186    Recent Labs  06/11/13 0500 06/12/13 0520  NA 132* 134*  K 3.5 3.8  CL 97 101  CO2 27 27  BUN 6 7  CREATININE 0.66 0.66  GLUCOSE 148* 118*  CALCIUM 8.1* 8.1*   No results found for this basename: LABPT, INR,  in the last 72 hours  Neurovascular intact Sensation intact distally Dorsiflexion/Plantar flexion intact Incision: no drainage  Assessment/Plan: 2 Days Post-Op Procedure(s) (LRB): ARTHROPLASTY MONOPOLAR HIP CEMENTED (Right) Up with therapy SNF when stable WBAT, ASA for VTE prophylaxis, hydrocodone for pain OV with Dr Ophelia Charter in 2 weeks Change dressing as needed.  Use Mepilex dressing.  May wet staples for hygiene if no drainage on POD 5 Ice packs as needed for pain and swelling. SCDs while at rest   Abel Hageman M 06/12/2013, 12:12 PM

## 2013-06-12 NOTE — Progress Notes (Signed)
[facility] PROGRESS NOTE  [name] JYN:[id number] DOB: 09-14-1934 DOA: 06/09/2013 PCP: [name], MD  Assessment/Plan: Principal Problem:  1. Hip fracture, right:  - Recommendations per primary - Pt is s/p 2 days post op Arthroplasty monopolar hip cemented (right)  Active Problems:  Anemia: - No active bleeding and most likely secondary to #1 as well as malnutrition. We'll reassess CBC next a.m. and plan on transfusing if hemoglobin less than 7.0  HYPERLIPIDEMIA:  -Stable. Continue statin.   GERD:  - Stable. Continue PPI.   OSTEOARTHRITIS, KNEE, RIGHT   IRRITABLE BOWEL SYNDROME, HX OF   Severe protein calorie malnutrition:Pt meets criteria for severe MALNUTRITION in the context of chronic illness as evidenced by <75% estimated energy intake for the past 3 weeks in addition to pt with severe muscle wasting and subcutaneous fat loss in hands.   Hyponatremia: Likely from dehydration and poor oral intake. Has improved with improvement in oral intake and IV fluids  GAD (generalized anxiety disorder): Stable.   Acute right hip pain: Secondary fracture and repair. See above   Code Status: Full Family Communication: No family at bedside Disposition Plan: To skilled nursing facility unless otherwise indicated by family and patient   Consultants:  Orthopedic surgeons  Procedures:  As listed above  Antibiotics:  Vancomycin  HPI/Subjective: Patient has no new complaints today. Is okay with getting transfused if hemoglobin less than 7.0  Objective: Filed Vitals:   06/12/13 1158  BP:   Pulse:   Temp:   Resp: 16    Intake/Output Summary (Last 24 hours) at 06/12/13 1235 Last data filed at 06/12/13 8657  Gross per 24 hour  Intake   2120 ml  Output   1300 ml  Net    820 ml   Filed Weights   06/09/13 1806  Weight: 58.5 kg (128 lb 15.5 oz)    Exam:   General:  Patient in no acute distress, alert and awake  Cardiovascular: Regular rate and  rhythm, no murmurs rubs or gallops  Respiratory: Clear to auscultation, no wheezes  Abdomen: Soft nontender, nondistended  Musculoskeletal: No clubbing  Data Reviewed: Basic Metabolic Panel:  Recent Labs Lab 06/09/13 1142 06/10/13 0525 06/11/13 0500 06/12/13 0520  NA 126* 129* 132* 134*  K 3.6 3.4* 3.5 3.8  CL 91* 95* 97 101  CO2 24 26 27 27   GLUCOSE 115* 138* 148* 118*  BUN 7 5* 6 7  CREATININE 0.67 0.65 0.66 0.66  CALCIUM 9.0 8.6 8.1* 8.1*   Liver Function Tests:  Recent Labs Lab 06/10/13 0525  AST 21  ALT 18  ALKPHOS 96  BILITOT 0.7  PROT 6.8  ALBUMIN 3.4*   No results found for this basename: LIPASE, AMYLASE,  in the last 168 hours No results found for this basename: AMMONIA,  in the last 168 hours CBC:  Recent Labs Lab 06/09/13 1142 06/10/13 0015 06/10/13 0525 06/10/13 1946 06/11/13 0500 06/12/13 0520  WBC 14.3*  --  10.4  --  9.5 9.5  NEUTROABS 12.3*  --   --   --   --   --   HGB 11.4* 11.0* 11.4* 9.8* 7.9* 7.1*  HCT 32.3* 31.2* 32.5* 28.1* 22.4* 20.6*  MCV 86.8  --  88.1  --  88.2 88.8  PLT 274  --  263  --  176 186   Cardiac Enzymes:  Recent Labs Lab 06/09/13 1845  TROPONINI <0.30   BNP (last 3 results) No results found for this basename: PROBNP,  in the last 8760 hours CBG: No results found for this basename: GLUCAP,  in the last 168 hours  Recent Results (from the past 240 hour(s))  SURGICAL PCR SCREEN     Status: Abnormal   Collection Time    06/09/13  6:15 PM      Result Value Range Status   MRSA, PCR POSITIVE (*) NEGATIVE Final   Comment: RESULT CALLED TO, READ BACK BY AND VERIFIED WITH:     C YOUNG RN 2200 06/09/13 A NAVARRO   Staphylococcus aureus POSITIVE (*) NEGATIVE Final   Comment:            The Xpert SA Assay (FDA     approved for NASAL specimens     in patients over 76 years of age),     is one component of     a comprehensive surveillance     program.  Test performance has     been validated by [employer] for patients greater     than or equal to 45 year old.     It is not intended     to diagnose infection nor to     guide or monitor treatment.     Studies: Dg Pelvis Portable  06/11/2013   CLINICAL DATA:  Postoperative total hip replacement  EXAM: PORTABLE PELVIS 1-2 VIEWS  COMPARISON:  None.  FINDINGS: There is a total hip prosthesis on the right which appears well seated. No fracture or dislocation apparent. There is moderate narrowing of the left hip joint.  IMPRESSION: Total hip prosthesis on the right appears well seated. No fracture or dislocation apparent.   Electronically Signed   By: Bretta Bang M.D.   On: 06/11/2013 08:08    Scheduled Meds: . aspirin EC  325 mg Oral Q breakfast  . carvedilol  6.25 mg Oral BID WC  . Chlorhexidine Gluconate Cloth  6 each Topical Q0600  . dicyclomine  20 mg Oral TID  . docusate sodium  100 mg Oral BID  . escitalopram  20 mg Oral Daily  . multivitamin with minerals  1 tablet Oral Daily  . mupirocin ointment  1 application Nasal BID  . pantoprazole  40 mg Oral Daily  . vancomycin  1,000 mg Intravenous Once   Continuous Infusions: . sodium chloride 100 mL/hr at 06/09/13 2304  . 0.9 % NaCl with KCl 20 mEq / L 75 mL/hr at 06/12/13 1131    Principal Problem:   Hip fracture, right Active Problems:   HYPERLIPIDEMIA   GERD   OSTEOARTHRITIS, KNEE, RIGHT   IRRITABLE BOWEL SYNDROME, HX OF   Hyponatremia   Dehydration   GAD (generalized anxiety disorder)   Acute right hip pain   Protein-calorie malnutrition, severe   Acute blood loss anemia    Time spent: 35 minutes    [name]  [facility] Pager [phone] If 7PM-7AM, please contact night-coverage at [email or web address], password [facility] 06/12/2013, 12:35 PM  LOS: 3 days

## 2013-06-12 NOTE — Progress Notes (Signed)
I met with pt to discuss d/c planning. She informed me that she plans on going to her own house at discharge and her husband will be her care giver. She chose [facility] to provide the home health services. Orders for the services need to be entered in EPIC. She already has a walker and BSC. Referral made to [facility] with Genevieve Norlander.  Algernon Huxley RN, BSN   [phone]

## 2013-06-12 NOTE — Progress Notes (Signed)
CSW met with patient. Patient is alert and oriented X3 and is much more awake/alert this morning. CSW provided patient with bed offers. She was upset that [facility] does not contract with her insurance. She will discuss choices with her daughter.  Binyamin Nelis C. Antionette Luster MSW, LCSW [phone]

## 2013-06-13 LAB — CBC
HCT: 18.2 % — ABNORMAL LOW (ref 36.0–46.0)
HCT: 25.8 % — ABNORMAL LOW (ref 36.0–46.0)
Hemoglobin: 6.6 g/dL — CL (ref 12.0–15.0)
MCHC: 36.3 g/dL — ABNORMAL HIGH (ref 30.0–36.0)
MCV: 87.5 fL (ref 78.0–100.0)
MCV: 86.9 fL (ref 78.0–100.0)
RBC: 2.08 MIL/uL — ABNORMAL LOW (ref 3.87–5.11)
RBC: 2.97 MIL/uL — ABNORMAL LOW (ref 3.87–5.11)
WBC: 8.7 10*3/uL (ref 4.0–10.5)

## 2013-06-13 LAB — PREPARE RBC (CROSSMATCH)

## 2013-06-13 NOTE — Progress Notes (Signed)
Patient ID: [name], female   DOB: [birth year], 78 y.o.   MRN: [id number] Comfortable in bed. Right hip wound with minimal dried drainage on dressing. No LE edema. N/V appears intact.Receiving blood for post op anemia.Social services has addressed issue of post hospital care--family wishes pt to go home+.Stable from ortho standpoint

## 2013-06-13 NOTE — Progress Notes (Signed)
[facility] PROGRESS NOTE  [name] ZOX:[id number] DOB: July 01, 1935 DOA: 06/09/2013 PCP: Desiree Hoit, MD  Assessment/Plan: Principal Problem:  1. Hip fracture, right:  - Recommendations per primary - Pt is s/p 2 days post op Arthroplasty monopolar hip cemented (right)  Active Problems:  Anemia: - No active bleeding but H/H continues to trend down. Today being under 7 as such agree with transfusion of 2 units of packed red blood cells. -Will reassess blood level after transfusion - Patient reports feeling weak consistent with symptomatic anemia  HYPERLIPIDEMIA:  -Stable. Continue statin.   GERD:  - Stable. Continue PPI.   OSTEOARTHRITIS, KNEE, RIGHT   IRRITABLE BOWEL SYNDROME, HX OF   Severe protein calorie malnutrition:Pt meets criteria for severe MALNUTRITION in the context of chronic illness as evidenced by <75% estimated energy intake for the past 3 weeks in addition to pt with severe muscle wasting and subcutaneous fat loss in hands.   Hyponatremia: Likely from dehydration and poor oral intake. Has improved with improvement in oral intake and IV fluids - Will reassess levels next a.m.  GAD (generalized anxiety disorder): Stable.   Acute right hip pain: Secondary fracture and repair. See above   Code Status: Full Family Communication: No family at bedside Disposition Plan: To skilled nursing facility unless otherwise indicated by family and patient   Consultants:  Orthopedic surgeons  Procedures:  As listed above  Antibiotics:  Vancomycin  HPI/Subjective: The only new complaint today is weakness. No other acute issues reported overnight  Objective: Filed Vitals:   06/13/13 1042  BP: 144/80  Pulse: 90  Temp: 97.7 F (36.5 C)  Resp: 17    Intake/Output Summary (Last 24 hours) at 06/13/13 1131 Last data filed at 06/13/13 1025  Gross per 24 hour  Intake  577.5 ml  Output   1900 ml  Net -1322.5 ml   Filed Weights   06/09/13 1806   Weight: 58.5 kg (128 lb 15.5 oz)    Exam:   General:  Patient in no acute distress, alert and awake  Cardiovascular: Regular rate and rhythm, no murmurs rubs or gallops  Respiratory: Clear to auscultation, no wheezes  Abdomen: Soft nontender, nondistended  Musculoskeletal: No clubbing  Data Reviewed: Basic Metabolic Panel:  Recent Labs Lab 06/09/13 1142 06/10/13 0525 06/11/13 0500 06/12/13 0520  NA 126* 129* 132* 134*  K 3.6 3.4* 3.5 3.8  CL 91* 95* 97 101  CO2 24 26 27 27   GLUCOSE 115* 138* 148* 118*  BUN 7 5* 6 7  CREATININE 0.67 0.65 0.66 0.66  CALCIUM 9.0 8.6 8.1* 8.1*   Liver Function Tests:  Recent Labs Lab 06/10/13 0525  AST 21  ALT 18  ALKPHOS 96  BILITOT 0.7  PROT 6.8  ALBUMIN 3.4*   No results found for this basename: LIPASE, AMYLASE,  in the last 168 hours No results found for this basename: AMMONIA,  in the last 168 hours CBC:  Recent Labs Lab 06/09/13 1142  06/10/13 0525 06/10/13 1946 06/11/13 0500 06/12/13 0520 06/13/13 0510  WBC 14.3*  --  10.4  --  9.5 9.5 9.5  NEUTROABS 12.3*  --   --   --   --   --   --   HGB 11.4*  < > 11.4* 9.8* 7.9* 7.1* 6.6*  HCT 32.3*  < > 32.5* 28.1* 22.4* 20.6* 18.2*  MCV 86.8  --  88.1  --  88.2 88.8 87.5  PLT 274  --  263  --  176  186 199  < > = values in this interval not displayed. Cardiac Enzymes:  Recent Labs Lab 06/09/13 1845  TROPONINI <0.30   BNP (last 3 results) No results found for this basename: PROBNP,  in the last 8760 hours CBG: No results found for this basename: GLUCAP,  in the last 168 hours  Recent Results (from the past 240 hour(s))  SURGICAL PCR SCREEN     Status: Abnormal   Collection Time    06/09/13  6:15 PM      Result Value Range Status   MRSA, PCR POSITIVE (*) NEGATIVE Final   Comment: RESULT CALLED TO, READ BACK BY AND VERIFIED WITH:     C YOUNG RN 2200 06/09/13 A NAVARRO   Staphylococcus aureus POSITIVE (*) NEGATIVE Final   Comment:            The Xpert SA  Assay (FDA     approved for NASAL specimens     in patients over 86 years of age),     is one component of     a comprehensive surveillance     program.  Test performance has     been validated by [employer] for patients greater     than or equal to 80 year old.     It is not intended     to diagnose infection nor to     guide or monitor treatment.     Studies: No results found.  Scheduled Meds: . aspirin EC  325 mg Oral Q breakfast  . carvedilol  6.25 mg Oral BID WC  . Chlorhexidine Gluconate Cloth  6 each Topical Q0600  . dicyclomine  20 mg Oral TID  . docusate sodium  100 mg Oral BID  . escitalopram  20 mg Oral Daily  . multivitamin with minerals  1 tablet Oral Daily  . mupirocin ointment  1 application Nasal BID  . pantoprazole  40 mg Oral Daily  . vancomycin  1,000 mg Intravenous Once   Continuous Infusions:    Principal Problem:   Hip fracture, right Active Problems:   HYPERLIPIDEMIA   GERD   OSTEOARTHRITIS, KNEE, RIGHT   IRRITABLE BOWEL SYNDROME, HX OF   Hyponatremia   Dehydration   GAD (generalized anxiety disorder)   Acute right hip pain   Protein-calorie malnutrition, severe   Acute blood loss anemia    Time spent: 35 minutes    [name]  [facility] Pager [phone] If 7PM-7AM, please contact night-coverage at [email or web address], password [facility] 06/13/2013, 11:31 AM  LOS: 4 days

## 2013-06-13 NOTE — Progress Notes (Signed)
Attempted to meet with Pt.    Per RN, Pt not alert enough to participate in meaningful conversation.  LM for Pt's daughter on her cell and home phones.  Providence Crosby, LCSWA Clinical Social Work [phone]

## 2013-06-13 NOTE — Progress Notes (Signed)
Physical Therapy Treatment Patient Details Name: [name] MRN: [id number] DOB: 10/16/1934 Today's Date: 06/13/2013 Time: 7253-6644 PT Time Calculation (min): 35 min  PT Assessment / Plan / Recommendation  History of Present Illness 78 yo female s/p R hip hemiarthroplasty 11/19. Hx of chronic LBP (back surgery), anemia, anxiety.    PT Comments   Pt continues to be confused and require constant cues for attention to task, hip precautions, and safety; chart states dtr wants to take pt home; she will need 24 hour assist  Follow Up Recommendations  SNF     Does the patient have the potential to tolerate intense rehabilitation     Barriers to Discharge        Equipment Recommendations  None recommended by PT    Recommendations for Other Services    Frequency Min 3X/week   Progress towards PT Goals Progress towards PT goals: Progressing toward goals  Plan Current plan remains appropriate    Precautions / Restrictions Precautions Precautions: Posterior Hip;Fall Restrictions RLE Weight Bearing: Weight bearing as tolerated   Pertinent Vitals/Pain Min c/o pain right hip    Mobility  Bed Mobility Bed Mobility: Supine to Sit Supine to Sit: 1: +2 Total assist Supine to Sit: Patient Percentage: 70% Details for Bed Mobility Assistance: Assist for trunk and R LE. Increased time. VCS safety, technique, adherence to hip precautions. Utilized bedpad to aid with scooting, positioning.  Transfers Transfers: Sit to Stand;Stand to Sit Sit to Stand: 1: +2 Total assist Sit to Stand: Patient Percentage: 60% Stand to Sit: 1: +2 Total assist Stand to Sit: Patient Percentage: 60% Details for Transfer Assistance: Assist to rise, stabilize, control descent. VCS safety, technique, R LE positioning.  Ambulation/Gait Ambulation/Gait Assistance: 1: +2 Total assist Ambulation/Gait: Patient Percentage: 70% Ambulation Distance (Feet): 30 Feet Assistive device: Rolling walker Ambulation/Gait  Assistance Details: constant multi-modal cues for sequence  Gait Pattern: Step-to pattern;Antalgic;Trunk flexed;Decreased stride length    Exercises     PT Diagnosis:    PT Problem List:   PT Treatment Interventions:     PT Goals (current goals can now be found in the care plan section) Acute Rehab PT Goals Patient Stated Goal: none stated Time For Goal Achievement: 06/25/13 Potential to Achieve Goals: Good  Visit Information  Last PT Received On: 06/13/13 Assistance Needed: +2 History of Present Illness: 78 yo female s/p R hip hemiarthroplasty 11/19. Hx of chronic LBP (back surgery), anemia, anxiety.     Subjective Data  Patient Stated Goal: none stated   Cognition  Cognition Arousal/Alertness: Awake/alert Behavior During Therapy: Restless Overall Cognitive Status: Impaired/Different from baseline Area of Impairment: Memory;Attention;Safety/judgement Current Attention Level: Sustained Memory: Decreased recall of precautions Safety/Judgement: Decreased awareness of safety;Decreased awareness of deficits General Comments: pt requires constant muliti-modal cues to adhere to hip precautions,     Balance     End of Session PT - End of Session Equipment Utilized During Treatment: Gait belt Activity Tolerance: Patient tolerated treatment well Patient left: in chair;with call bell/phone within reach;with chair alarm set Nurse Communication: Mobility status;Precautions;Weight bearing status   GP     [facility] 06/13/2013, 4:13 PM

## 2013-06-13 NOTE — Progress Notes (Signed)
Pt became very agitated  trying to get oob with out assistance. Bed alarm on, staff at bedside, pt unaware that she is in the hospital and wants to go home.Very adamant about leaving. Yelling at staff members.Call the pt's daughter made her aware, states she will come and sit with the pt will monitor.

## 2013-06-13 NOTE — Progress Notes (Signed)
Spoke with Pt's daughter via phone re: d/c plans.  Pt's daughter stated that the plan is for Pt to return home to Pt's husband.  Pt's daughter stated that she lives across the street from Pt and that she provides the majority of the care for Pt.   They are interested in [facility] and CSW notified RNCM.  CSW thanked Pt's daughter for her time.  Providence Crosby, LCSWA Clinical Social Work [phone]

## 2013-06-14 DIAGNOSIS — S72009S Fracture of unspecified part of neck of unspecified femur, sequela: Secondary | ICD-10-CM

## 2013-06-14 DIAGNOSIS — E876 Hypokalemia: Secondary | ICD-10-CM | POA: Diagnosis not present

## 2013-06-14 LAB — BASIC METABOLIC PANEL
Calcium: 8.8 mg/dL (ref 8.4–10.5)
Creatinine, Ser: 0.58 mg/dL (ref 0.50–1.10)
GFR calc Af Amer: >90 mL/min (ref >90)
GFR calc non Af Amer: 87 mL/min — ABNORMAL LOW (ref >90)
Glucose, Bld: 123 mg/dL — ABNORMAL HIGH (ref 70–99)
Potassium: 3.1 mEq/L — ABNORMAL LOW (ref 3.5–5.1)
Sodium: 136 mEq/L (ref 135–145)

## 2013-06-14 LAB — CBC
Hemoglobin: 9.8 g/dL — ABNORMAL LOW (ref 12.0–15.0)
RBC: 3.25 MIL/uL — ABNORMAL LOW (ref 3.87–5.11)
RDW: 13.3 % (ref 11.5–15.5)
WBC: 8.7 10*3/uL (ref 4.0–10.5)

## 2013-06-14 MED ORDER — TRAMADOL HCL 50 MG PO TABS: 50.0000 mg | ORAL_TABLET | Freq: Four times a day (QID) | ORAL | Status: DC | PRN

## 2013-06-14 MED ORDER — ALPRAZOLAM 0.5 MG PO TABS
0.5000 mg | ORAL_TABLET | Freq: Three times a day (TID) | ORAL | Status: DC
  Administered 2013-06-14 – 2013-06-22 (×21): 0.5 mg via ORAL
  Filled 2013-06-14 (×22): qty 1

## 2013-06-14 MED ORDER — POTASSIUM CHLORIDE CRYS ER 20 MEQ PO TBCR
40.0000 meq | EXTENDED_RELEASE_TABLET | Freq: Once | ORAL | Status: AC
  Administered 2013-06-14: 40 meq via ORAL
  Filled 2013-06-14: qty 2

## 2013-06-14 NOTE — Plan of Care (Signed)
Problem: Phase I Progression Outcomes Goal: Sutures/staples intact Outcome: Progressing Old bloody drainage noted at the top of dressing

## 2013-06-14 NOTE — Progress Notes (Signed)
Patient ID: [name], female   DOB: 06-27-1935, 78 y.o.   MRN: [id number] Sitting up in chair without  Complaints. Denies SOB or chest pain. Moving toes with good "feeling". Dressing changed-wound clean, dry. No calf pain.--continue with OOB activities. H&H much improved with transfusion.

## 2013-06-14 NOTE — Plan of Care (Signed)
Problem: Phase I Progression Outcomes Goal: OOB as tolerated unless otherwise ordered Outcome: Progressing Sitting up in chair     

## 2013-06-14 NOTE — Progress Notes (Signed)
Pt's daughter at bedside, she is concerned that she is not receiving her xanax tid as she does at home every day. Dr. Cena Benton called and spoke with daughter about the xanax. She will be started on xanax tid and will receive tylenol for pain when needed.Bentyl and tramadol was dc'd. Daughter states the pt has a ramp at home and will not need to walk up any steps to get in the house. Pt has been confused off and on today, Dr. Cena Benton aware.

## 2013-06-14 NOTE — Progress Notes (Addendum)
[facility] PROGRESS NOTE  Doshie Maggi ZOX:[id number] DOB: August 09, 1934 DOA: 06/09/2013 PCP: [name], MD Brief narrative: Patient is a 78 year old Caucasian female who presented to the after an unwitnessed fall with subsequent right hip pain. Hip x-ray were diagnose acute right femoral neck fracture with varus impaction. Orthopedic surgeon was subsequently consulted.  Assessment/Plan: Principal Problem:  1. Hip fracture, right:  - Recommendations per primary - Pt is s/p 3 days post op Arthroplasty monopolar hip cemented (right) - Nursing reports the patient has been confused on and off today. As such will d/c all opioid regiments and treat pain with Tylenol - addendum: PT ordered from home once ready for discharge home.   Active Problems:  Anemia: - Improved after transfusion (2 units) - Blood levels are stable, and repeat hemoglobin level elevated today when compared to yesterday - Addendum: improvement in weakness after transfusion  HYPERLIPIDEMIA:  -Stable. Continue statin.   GERD:  - Stable. Continue PPI.   OSTEOARTHRITIS, KNEE, RIGHT   IRRITABLE BOWEL SYNDROME, HX OF   Severe protein calorie malnutrition: Pt meets criteria for severe MALNUTRITION in the context of chronic illness as evidenced by <75% estimated energy intake for the past 3 weeks in addition to pt with severe muscle wasting and subcutaneous fat loss in hands.   Hyponatremia: Likely from dehydration and poor oral intake. Has improved with improvement in oral intake and IV fluids - Will reassess levels next a.m.  GAD (generalized anxiety disorder): Stable. Patient has Xanax when necessary. May have to discontinue of confusion not resolved with changing opioid regimen  Acute right hip pain: Secondary fracture and repair. See above  Addendum: Hypokalemia - Will replace orally - reassess next am.   Code Status: Full Family Communication: No family at bedside Disposition Plan: To Home with  home health most likely next am 06/15/13   Consultants:  Orthopedic surgeons  Procedures:  As listed above  Antibiotics:  Vancomycin  HPI/Subjective: Patient seemed confused talking about how somebody attacked her in the hallway despite the fact that patient has been observed by the nurse throughout the course of the day and has not been in the hallway much less attacked.  Objective: Filed Vitals:   06/14/13 1407  BP: 131/67  Pulse: 81  Temp: 98.1 F (36.7 C)  Resp: 18    Intake/Output Summary (Last 24 hours) at 06/14/13 1511 Last data filed at 06/14/13 1204  Gross per 24 hour  Intake    710 ml  Output   1503 ml  Net   -793 ml   Filed Weights   06/09/13 1806  Weight: 58.5 kg (128 lb 15.5 oz)    Exam:   General:  Patient in no acute distress, alert and awake  Cardiovascular: Regular rate and rhythm, no murmurs rubs or gallops  Respiratory: Clear to auscultation, no wheezes  Abdomen: Soft nontender, nondistended  Musculoskeletal: No clubbing  Data Reviewed: Basic Metabolic Panel:  Recent Labs Lab 06/09/13 1142 06/10/13 0525 06/11/13 0500 06/12/13 0520 06/14/13 0523  NA 126* 129* 132* 134* 136  K 3.6 3.4* 3.5 3.8 3.1*  CL 91* 95* 97 101 98  CO2 24 26 27 27 30   GLUCOSE 115* 138* 148* 118* 123*  BUN 7 5* 6 7 7   CREATININE 0.67 0.65 0.66 0.66 0.58  CALCIUM 9.0 8.6 8.1* 8.1* 8.8   Liver Function Tests:  Recent Labs Lab 06/10/13 0525  AST 21  ALT 18  ALKPHOS 96  BILITOT 0.7  PROT 6.8  ALBUMIN 3.4*  No results found for this basename: LIPASE, AMYLASE,  in the last 168 hours No results found for this basename: AMMONIA,  in the last 168 hours CBC:  Recent Labs Lab 06/09/13 1142  06/11/13 0500 06/12/13 0520 06/13/13 0510 06/13/13 2055 06/14/13 0523  WBC 14.3*  < > 9.5 9.5 9.5 8.7 8.7  NEUTROABS 12.3*  --   --   --   --   --   --   HGB 11.4*  < > 7.9* 7.1* 6.6* 9.0* 9.8*  HCT 32.3*  < > 22.4* 20.6* 18.2* 25.8* 28.1*  MCV 86.8  <  > 88.2 88.8 87.5 86.9 86.5  PLT 274  < > 176 186 199 225 251  < > = values in this interval not displayed. Cardiac Enzymes:  Recent Labs Lab 06/09/13 1845  TROPONINI <0.30   BNP (last 3 results) No results found for this basename: PROBNP,  in the last 8760 hours CBG: No results found for this basename: GLUCAP,  in the last 168 hours  Recent Results (from the past 240 hour(s))  SURGICAL PCR SCREEN     Status: Abnormal   Collection Time    06/09/13  6:15 PM      Result Value Range Status   MRSA, PCR POSITIVE (*) NEGATIVE Final   Comment: RESULT CALLED TO, READ BACK BY AND VERIFIED WITH:     C YOUNG RN 2200 06/09/13 A NAVARRO   Staphylococcus aureus POSITIVE (*) NEGATIVE Final   Comment:            The Xpert SA Assay (FDA     approved for NASAL specimens     in patients over 37 years of age),     is one component of     a comprehensive surveillance     program.  Test performance has     been validated by [employer] for patients greater     than or equal to 51 year old.     It is not intended     to diagnose infection nor to     guide or monitor treatment.     Studies: No results found.  Scheduled Meds: . aspirin EC  325 mg Oral Q breakfast  . carvedilol  6.25 mg Oral BID WC  . Chlorhexidine Gluconate Cloth  6 each Topical Q0600  . dicyclomine  20 mg Oral TID  . docusate sodium  100 mg Oral BID  . escitalopram  20 mg Oral Daily  . multivitamin with minerals  1 tablet Oral Daily  . mupirocin ointment  1 application Nasal BID  . pantoprazole  40 mg Oral Daily  . vancomycin  1,000 mg Intravenous Once   Continuous Infusions:    Principal Problem:   Hip fracture, right Active Problems:   HYPERLIPIDEMIA   GERD   OSTEOARTHRITIS, KNEE, RIGHT   IRRITABLE BOWEL SYNDROME, HX OF   Hyponatremia   Dehydration   GAD (generalized anxiety disorder)   Acute right hip pain   Protein-calorie malnutrition, severe   Acute blood loss anemia   Hypokalemia    Time  spent: 35 minutes    [name]  [facility] Pager [phone] If 7PM-7AM, please contact night-coverage at [email or web address], password [facility] 06/14/2013, 3:11 PM  LOS: 5 days

## 2013-06-14 NOTE — Discharge Summary (Addendum)
Physician Discharge Summary  [name] ZOX:[id number] DOB: 1934-08-12 DOA: 06/09/2013  PCP: [name], MD  Admit date: 06/09/2013 Discharge date: 06/14/2013  Time spent: > 35 minutes  Recommendations for Outpatient Follow-up:  1. Patient will require continued physical therapy after discharge from hospital 2. Follow up with hemoglobin and hematocrit levels 3. Follow up with potassium levels  Discharge Diagnoses:  Principal Problem:   Hip fracture, right Active Problems:   HYPERLIPIDEMIA   GERD   OSTEOARTHRITIS, KNEE, RIGHT   IRRITABLE BOWEL SYNDROME, HX OF   Hyponatremia   Dehydration   GAD (generalized anxiety disorder)   Acute right hip pain   Protein-calorie malnutrition, severe   Acute blood loss anemia   Hypokalemia   Discharge Condition: Stable  Diet recommendation: Heart healthy  Filed Weights   06/09/13 1806  Weight: 58.5 kg (128 lb 15.5 oz)    History of present illness:  Patient is a 77 year old Caucasian female who presented to the after an unwitnessed fall with subsequent right hip pain. Hip x-ray were diagnose acute right femoral neck fracture with varus impaction. Orthopedic surgeon was subsequently consulted.  Hospital Course:   1. Hip fracture, right:  - Vision to continue physical therapy at discharge - Pt is sp 4 days post op Arthroplasty monopolar hip cemented (right)  - Nursing reports the patient has been confused on and off today. As such will discontinue Bentyl and treat pain with tylenol.  Active Problems:   Anemia:  - Improved after transfusion (2 units)  - Blood levels are stable   HYPERLIPIDEMIA:  -Stable. Continue statin.   GERD:  - Stable. Continue PPI.   OSTEOARTHRITIS, KNEE, RIGHT   IRRITABLE BOWEL SYNDROME, HX OF  Severe protein calorie malnutrition: Pt meets criteria for severe MALNUTRITION in the context of chronic illness as evidenced by <75% estimated energy intake for the past 3 weeks in addition to pt with  severe muscle wasting and subcutaneous fat loss in hands.   Hyponatremia: Likely from dehydration and poor oral intake. Has improved with improvement in oral intake and IV fluids  - Resolved  GAD (generalized anxiety disorder): Stable. Addendum: Patient has been on xanax tid for years as such will continue while patient is in house.  Acute right hip pain: Secondary fracture and repair. See above    Procedures: post op Arthroplasty monopolar hip cemented (right)  Consultations:  Orthopedic surgeon: Dr. Cleophas Dunker  Discharge Exam: Filed Vitals:   06/14/13 1407  BP: 131/67  Pulse: 81  Temp: 98.1 F (36.7 C)  Resp: 18    General: Pt in NAD, Alert and Awake Cardiovascular: RRR, no MRG Respiratory: CTA BL, no wheezes  Discharge Instructions  Discharge Orders   Future Orders Complete By Expires   Full weight bearing  As directed    Questions:     Laterality:     Extremity:     Full weight bearing  As directed    Questions:     Laterality:     Extremity:     Posterior total hip precautions  As directed        Medication List    STOP taking these medications       calcium-vitamin D 500-200 MG-UNIT per tablet  Commonly known as:  OSCAL WITH D     IMODIUM PO      TAKE these medications       ALPRAZolam 0.5 MG tablet  Commonly known as:  XANAX  Take 0.5 mg by mouth 3 (three) times  daily as needed for anxiety or sleep. For anxiety.     aspirin 325 MG tablet  Commonly known as:  BAYER ASPIRIN  Take 1 tablet (325 mg total) by mouth daily.     COREG 6.25 MG tablet  Generic drug:  carvedilol  Take 6.25 mg by mouth 2 (two) times daily with a meal.     dicyclomine 20 MG tablet  Commonly known as:  BENTYL  Take 20 mg by mouth 3 (three) times daily.     escitalopram 10 MG tablet  Commonly known as:  LEXAPRO  Take 20 mg by mouth daily.     multivitamins ther. w/minerals Tabs tablet  Take 1 tablet by mouth daily.     nitroGLYCERIN 0.4 MG SL tablet  Commonly  known as:  NITROSTAT  Place 1 tablet (0.4 mg total) under the tongue every 5 (five) minutes as needed. For chest pain.     omeprazole 20 MG capsule  Commonly known as:  PRILOSEC  Take 20 mg by mouth daily.     traMADol 50 MG tablet  Commonly known as:  ULTRAM  Take 1 tablet (50 mg total) by mouth every 6 (six) hours as needed for moderate pain.     vitamin B-12 500 MCG tablet  Commonly known as:  CYANOCOBALAMIN  Take 500 mcg by mouth daily.       Allergies  Allergen Reactions  . Sulfamethoxazole-Trimethoprim Hives       Follow-up Information   Follow up with YATES,MARK C, MD. Schedule an appointment as soon as possible for a visit in 2 weeks.   Specialty:  Orthopedic Surgery   Contact information:   [address] [place] [address] [phone]       Follow up with Eldred Manges, MD In 2 weeks.   Specialty:  Orthopedic Surgery   Contact information:   [address] [address] [place] [address] [phone]        The results of significant diagnostics from this hospitalization (including imaging, microbiology, ancillary and laboratory) are listed below for reference.    Significant Diagnostic Studies: Dg Chest 1 View  06/09/2013   CLINICAL DATA:  78 year old female status post fall with hip fracture. Preoperative study. Initial encounter.  EXAM: CHEST - 1 VIEW  COMPARISON:  07/30/2011.  FINDINGS: Portable AP semi upright view at a 1212 hrs. Lung volumes are stable and within normal limits. Normal cardiac size and mediastinal contours. Visualized tracheal air column is within normal limits. No pneumothorax, edema or pleural effusion. Incidental breast implants.  IMPRESSION: No acute cardiopulmonary abnormality.   Electronically Signed   By: Augusto Gamble M.D.   On: 06/09/2013 12:27   Dg Hip Complete Right  06/09/2013   CLINICAL DATA:  78 year old female status post fall with right hip pain. Initial encounter.  EXAM: RIGHT HIP - COMPLETE 2+ VIEW  COMPARISON:   None.  FINDINGS: Impacted right femoral neck fracture with varus angulation. Right femoral head remains normally located. Intertrochanteric segment of the right femur intact. Pelvis intact. Postoperative changes to the lower lumbar spine. Proximal left femur grossly intact.  IMPRESSION: Acute right femoral neck fracture with varus impaction.   Electronically Signed   By: Augusto Gamble M.D.   On: 06/09/2013 12:27   Ct Head Wo Contrast  06/09/2013   CLINICAL DATA:  Fall.  EXAM: CT HEAD WITHOUT CONTRAST  CT CERVICAL SPINE WITHOUT CONTRAST  TECHNIQUE: Multidetector CT imaging of the head and cervical spine was performed following the standard  protocol without intravenous contrast. Multiplanar CT image reconstructions of the cervical spine were also generated.  COMPARISON:  07/15/2012 brain MR. 03/22/2009 head CT and cervical spine CT.  FINDINGS: CT HEAD FINDINGS  No skull fracture or intracranial hemorrhage.  Mild small vessel disease type changes without CT evidence of large acute infarct.  Global atrophy.  No intracranial mass lesion noted on this unenhanced exam  CT CERVICAL SPINE FINDINGS  No cervical spine fracture detected.  Cervical alignment and degenerative changes relatively similar to the prior exam including transverse ligament hypertrophy. No abnormal prevertebral soft tissue swelling. If there is a high clinical suspicion of ligamentous injury, flexion and extension views or MR can be performed for further delineation.  Scarring lung apices  IMPRESSION: No skull fracture or intracranial hemorrhage.  No cervical spine fracture detected.  Please see above.   Electronically Signed   By: Bridgett Larsson M.D.   On: 06/09/2013 12:54   Ct Cervical Spine Wo Contrast  06/09/2013   CLINICAL DATA:  Fall.  EXAM: CT HEAD WITHOUT CONTRAST  CT CERVICAL SPINE WITHOUT CONTRAST  TECHNIQUE: Multidetector CT imaging of the head and cervical spine was performed following the standard protocol without intravenous contrast.  Multiplanar CT image reconstructions of the cervical spine were also generated.  COMPARISON:  07/15/2012 brain MR. 03/22/2009 head CT and cervical spine CT.  FINDINGS: CT HEAD FINDINGS  No skull fracture or intracranial hemorrhage.  Mild small vessel disease type changes without CT evidence of large acute infarct.  Global atrophy.  No intracranial mass lesion noted on this unenhanced exam  CT CERVICAL SPINE FINDINGS  No cervical spine fracture detected.  Cervical alignment and degenerative changes relatively similar to the prior exam including transverse ligament hypertrophy. No abnormal prevertebral soft tissue swelling. If there is a high clinical suspicion of ligamentous injury, flexion and extension views or MR can be performed for further delineation.  Scarring lung apices  IMPRESSION: No skull fracture or intracranial hemorrhage.  No cervical spine fracture detected.  Please see above.   Electronically Signed   By: Bridgett Larsson M.D.   On: 06/09/2013 12:54   Dg Pelvis Portable  06/11/2013   CLINICAL DATA:  Postoperative total hip replacement  EXAM: PORTABLE PELVIS 1-2 VIEWS  COMPARISON:  None.  FINDINGS: There is a total hip prosthesis on the right which appears well seated. No fracture or dislocation apparent. There is moderate narrowing of the left hip joint.  IMPRESSION: Total hip prosthesis on the right appears well seated. No fracture or dislocation apparent.   Electronically Signed   By: Bretta Bang M.D.   On: 06/11/2013 08:08    Microbiology: Recent Results (from the past 240 hour(s))  SURGICAL PCR SCREEN     Status: Abnormal   Collection Time    06/09/13  6:15 PM      Result Value Range Status   MRSA, PCR POSITIVE (*) NEGATIVE Final   Comment: RESULT CALLED TO, READ BACK BY AND VERIFIED WITH:     C YOUNG RN 2200 06/09/13 A NAVARRO   Staphylococcus aureus POSITIVE (*) NEGATIVE Final   Comment:            The Xpert SA Assay (FDA     approved for NASAL specimens     in patients  over 33 years of age),     is one component of     a comprehensive surveillance     program.  Test performance has     been validated  by [facility] for patients greater     than or equal to 68 year old.     It is not intended     to diagnose infection nor to     guide or monitor treatment.     Labs: Basic Metabolic Panel:  Recent Labs Lab 06/09/13 1142 06/10/13 0525 06/11/13 0500 06/12/13 0520 06/14/13 0523  NA 126* 129* 132* 134* 136  K 3.6 3.4* 3.5 3.8 3.1*  CL 91* 95* 97 101 98  CO2 24 26 27 27 30   GLUCOSE 115* 138* 148* 118* 123*  BUN 7 5* 6 7 7   CREATININE 0.67 0.65 0.66 0.66 0.58  CALCIUM 9.0 8.6 8.1* 8.1* 8.8   Liver Function Tests:  Recent Labs Lab 06/10/13 0525  AST 21  ALT 18  ALKPHOS 96  BILITOT 0.7  PROT 6.8  ALBUMIN 3.4*   No results found for this basename: LIPASE, AMYLASE,  in the last 168 hours No results found for this basename: AMMONIA,  in the last 168 hours CBC:  Recent Labs Lab 06/09/13 1142  06/11/13 0500 06/12/13 0520 06/13/13 0510 06/13/13 2055 06/14/13 0523  WBC 14.3*  < > 9.5 9.5 9.5 8.7 8.7  NEUTROABS 12.3*  --   --   --   --   --   --   HGB 11.4*  < > 7.9* 7.1* 6.6* 9.0* 9.8*  HCT 32.3*  < > 22.4* 20.6* 18.2* 25.8* 28.1*  MCV 86.8  < > 88.2 88.8 87.5 86.9 86.5  PLT 274  < > 176 186 199 225 251  < > = values in this interval not displayed. Cardiac Enzymes:  Recent Labs Lab 06/09/13 1845  TROPONINI <0.30   BNP: BNP (last 3 results) No results found for this basename: PROBNP,  in the last 8760 hours CBG: No results found for this basename: GLUCAP,  in the last 168 hours     Signed:  [name]  [facility] 06/14/2013, 3:26 PM

## 2013-06-14 NOTE — Progress Notes (Signed)
06/14/2013 1505 Pt is preoperatively set up with [facility]. Waiting final recommendations for home. Isidoro Donning RN CCM Case Mgmt phone [phone]

## 2013-06-15 ENCOUNTER — Inpatient Hospital Stay (HOSPITAL_COMMUNITY): Payer: Medicare Other

## 2013-06-15 DIAGNOSIS — R410 Disorientation, unspecified: Secondary | ICD-10-CM | POA: Diagnosis present

## 2013-06-15 DIAGNOSIS — R404 Transient alteration of awareness: Secondary | ICD-10-CM

## 2013-06-15 LAB — URINALYSIS, ROUTINE W REFLEX MICROSCOPIC
Glucose, UA: NEGATIVE mg/dL
Ketones, ur: NEGATIVE mg/dL
Protein, ur: 30 mg/dL — AB
Urobilinogen, UA: 1 mg/dL (ref 0.0–1.0)

## 2013-06-15 LAB — CBC
HCT: 29.3 % — ABNORMAL LOW (ref 36.0–46.0)
Hemoglobin: 10.1 g/dL — ABNORMAL LOW (ref 12.0–15.0)
MCH: 29.8 pg (ref 26.0–34.0)
MCHC: 34.5 g/dL (ref 30.0–36.0)
MCV: 86.4 fL (ref 78.0–100.0)
RDW: 13.2 % (ref 11.5–15.5)
WBC: 11.1 10*3/uL — ABNORMAL HIGH (ref 4.0–10.5)

## 2013-06-15 LAB — BASIC METABOLIC PANEL
BUN: 6 mg/dL (ref 6–23)
CO2: 23 mEq/L (ref 19–32)
Chloride: 98 mEq/L (ref 96–112)
Creatinine, Ser: 0.51 mg/dL (ref 0.50–1.10)

## 2013-06-15 LAB — URINE MICROSCOPIC-ADD ON

## 2013-06-15 MED ORDER — POTASSIUM CHLORIDE 10 MEQ/100ML IV SOLN
10.0000 meq | INTRAVENOUS | Status: AC
  Administered 2013-06-15 (×2): 10 meq via INTRAVENOUS
  Filled 2013-06-15 (×4): qty 100

## 2013-06-15 MED ORDER — POTASSIUM CHLORIDE CRYS ER 20 MEQ PO TBCR
30.0000 meq | EXTENDED_RELEASE_TABLET | Freq: Once | ORAL | Status: AC
  Administered 2013-06-15: 16:00:00 30 meq via ORAL
  Filled 2013-06-15: qty 1

## 2013-06-15 NOTE — Progress Notes (Signed)
This shift while patien'ts RN, Hayden Pedro was transporting another pt to CT, pt was found by me, after hearing bed alarm go off, to be out of the bed in the bathroom in a frantic state. Very confused flailing arms and screaming that I was out to get her. Locked herself in the bathroom. Once door reopened I was assisted by tech [place]. Pt was found on one knee on the floor, still screaming for [place] to get away and not harm her and pushing [place] away. We were able to safely get her to [facility]. At this point her RN arrived and proceeded with her care. Hospitalist was called while Marcelle Smiling RN, Rinaldo Cloud RN, Maryuri Warnke RN and [place] NT attempted to calm, restrain and take vitals. Daughter was notified and asked to come to the hospital, daughter asked to speak with mother on phone, which she complied for several minutes.

## 2013-06-15 NOTE — Progress Notes (Signed)
Patient is becoming increase ly confused about why she is in the hospital. Bed alarm is on, immobilizer in place. Patient educated and made aware that she must have asst to get OOB to [facility]. Pt unable to retain information for long period of time. Will cont to monitor and reoriented patient.

## 2013-06-15 NOTE — Progress Notes (Signed)
Spoke with daughter Hilda Lias about d/c planning. She informed me that they want to use [facility] for the [facility] services needed. Referral made.  Algernon Huxley, RN BSN   [phone]

## 2013-06-15 NOTE — Progress Notes (Signed)
Patient daughter was called per previous noted and was made aware of the patients behavior. Daughter came from home to patient bedside to assist in calming patient. Patient became calmer and more oriented as the morning progressed with t he daughter at the bedside. Daughter had no concerns. The daughter request to go with patient to receive xray. Daughter appreciated call to make her aware of patients behavior. Informed staff that patient has acted this way before.
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
## 2013-06-15 NOTE — Progress Notes (Addendum)
Physical Therapy Treatment Patient Details Name: [name] MRN: [id number] DOB: [birth year] Today's Date: 06/15/2013 Time: 4098-1191 PT Time Calculation (min): 23 min  PT Assessment / Plan / Recommendation  History of Present Illness 78 yo female s/p R hip hemiarthroplasty 11/19. Hx of chronic LBP (back surgery), anemia, anxiety.    PT Comments   Progressing with mobility. Pt continues to required constant reminders and cueing of hip precautions and safety precautions. PT continues to recommend SNF however daughter prefers for pt to d/c home-if pt goes home will need HHPT.Pt will require 24 hour supervision for safety and mobility assistance.   Follow Up Recommendations  SNF; 24 hour supervision/assist     Does the patient have the potential to tolerate intense rehabilitation     Barriers to Discharge        Equipment Recommendations  None recommended by PT    Recommendations for Other Services OT consult  Frequency Min 3X/week   Progress towards PT Goals Progress towards PT goals: Progressing toward goals  Plan Current plan remains appropriate    Precautions / Restrictions Precautions Precautions: Posterior Hip;Fall Precaution Comments: Reviewed hip precautions and WB staus-pt unable to recall any.  Will need constant reminders likely.  Required Braces or Orthoses: Knee Immobilizer - Right (for safety, precautions) Knee Immobilizer - Right:  (in bed) Restrictions Weight Bearing Restrictions: No RLE Weight Bearing: Weight bearing as tolerated   Pertinent Vitals/Pain "a little" R LE/hip (pt unable to rate using scale)    Mobility  Bed Mobility Bed Mobility: Supine to Sit Supine to Sit: 3: Mod assist Details for Bed Mobility Assistance: Assist for R LE. Multimodal cues for safety, adherence to precautions.  Transfers Transfers: Stand to Sit;Sit to Stand Sit to Stand: 3: Mod assist;From bed Stand to Sit: 3: Mod assist;To chair/3-in-1 Details for Transfer Assistance:  Assist to rise, stabilize, control descent. VCS safety, technique, R LE positioning.  Ambulation/Gait Ambulation/Gait Assistance: 3: Mod assist Ambulation Distance (Feet): 75 Feet Assistive device: Rolling walker Ambulation/Gait Assistance Details: Assist to stabilize and maneuver with walker. VCS throughout for adherence to precautions, R LE positioning.  Gait Pattern: Step-through pattern;Trunk flexed;Antalgic    Exercises Total Joint Exercises Ankle Circles/Pumps: AROM;Both;10 reps;Seated Quad Sets: AROM;Both;5 reps;Seated Hip ABduction/ADduction: AAROM;10 reps;Seated   PT Diagnosis:    PT Problem List:   PT Treatment Interventions:     PT Goals (current goals can now be found in the care plan section)    Visit Information  Last PT Received On: 06/15/13 Assistance Needed: +2 History of Present Illness: 78 yo female s/p R hip hemiarthroplasty 11/19. Hx of chronic LBP (back surgery), anemia, anxiety.     Subjective Data      Cognition  Cognition Arousal/Alertness: Awake/alert Behavior During Therapy: Restless Overall Cognitive Status: Impaired/Different from baseline Area of Impairment: Attention;Memory;Orientation;Safety/judgement Orientation Level: Disoriented to;Time;Situation Memory: Decreased recall of precautions;Decreased short-term memory Safety/Judgement: Decreased awareness of safety;Decreased awareness of deficits General Comments: pt requires constant muliti-modal cues to adhere to hip precautions,     Balance     End of Session PT - End of Session Activity Tolerance: Patient tolerated treatment well Patient left: in chair;with call bell/phone within reach;with chair alarm set Nurse Communication: Mobility status;Precautions;Weight bearing status (all indicated on white board in room)   GP     Rebeca Alert, MPT Pager: [phone]

## 2013-06-15 NOTE — Progress Notes (Signed)
Event: Notified by RN that pt found in floor in her room sitting in the floor. She was awake and admitted to falling but was unable to give specific details. RN reports pt c/o pain in her head. Pt had fall earlier today and did have sitter at bedside. Unclear as to why sitter had not been assigned for this shift. NP to bedside. Subjective: Pt unable to give information d/t confusion. But when asked specifically she admits to head, neck, t-spine area and (R) hip pain. She appears to believe there is a cat in the room and ask me if I see the cat.  Objective: Ms Rita is a 78 y.o. female w/ a past medical history significant for chronic low back pain, IBS, anemia, depression, anxiety disorder and  gait instability  that was admitted 06/09/13 s/p fall. She had been having increased imbalance over the last 2 days prior to admission. Pt lives at home w/ husband.  ED evaluation revealed (R) hip fx and on 06/10/13 she had surgical repair of her (R) hip fx. She is currently 5 days post-op. She fell again this am at approx 0400 and (R) hip imaging at the time revealed stable post-op (R) hip. At bedside pt is awake and alert and in NAD.  She is quite pleasantly confused which RN and records indicate has been worsening over last 24 hrs especially at night. She is able to follow simple commands but is unable to describe her fall. She has multiple abrasions and skin tears on her BUE's in various stages of healing and a large (resolving) contusion to her (L) forehead/scalp area which is not new. Otherwise her face and head appear atraumatic. PEARRL. Chest wall w/o TTP, c-spine and t-spine positive for bony TTP. Pt MAE's x 3, limited motion to RLE. Abd soft, nt and w/ normal bs. BBS CTA. VSS. No obvious deformities.  Assessment/Plan: 1. Un-witnessed fall at bedside: Difficult to assess given confusion but no obvious new injury. Given her reports of pain will obtain ct head and cervical spine w/o cm as well as t-spine and  (R) hip films. Bed alarm is on and there is currently a sitter at bedside.  2. Acute delirium: Primary MD aware of pt's persistent delirium. Seems to improve during the day. Daughter reported earlier today that pt "has acted this way before". Urine and blood cx's pending. Pt is afebrile. All meds that would exacerbate this were d/c'd today except her Xanax which she has been on for years. Question acute deliruim associated w/ elderly hospitalized pt vs some element of underlying dementia or both. Will f/u imaging and cx's and continue to monitor closely.  Leanne Chang, NP-C [facility] Pager [phone]

## 2013-06-16 ENCOUNTER — Inpatient Hospital Stay (HOSPITAL_COMMUNITY): Payer: Medicare Other

## 2013-06-16 LAB — COMPREHENSIVE METABOLIC PANEL
Alkaline Phosphatase: 79 U/L (ref 39–117)
BUN: 13 mg/dL (ref 6–23)
Calcium: 8.6 mg/dL (ref 8.4–10.5)
Creatinine, Ser: 0.6 mg/dL (ref 0.50–1.10)
GFR calc Af Amer: >90 mL/min (ref >90)
Glucose, Bld: 177 mg/dL — ABNORMAL HIGH (ref 70–99)
Potassium: 3.4 mEq/L — ABNORMAL LOW (ref 3.5–5.1)
Sodium: 135 mEq/L (ref 135–145)
Total Bilirubin: 0.8 mg/dL (ref 0.3–1.2)
Total Protein: 6.2 g/dL (ref 6.0–8.3)

## 2013-06-16 LAB — CBC WITH DIFFERENTIAL/PLATELET
Basophils Absolute: 0.1 10*3/uL (ref 0.0–0.1)
Eosinophils Relative: 1 % (ref 0–5)
HCT: 26.2 % — ABNORMAL LOW (ref 36.0–46.0)
Lymphocytes Relative: 13 % (ref 12–46)
Monocytes Absolute: 1.5 10*3/uL — ABNORMAL HIGH (ref 0.1–1.0)
Monocytes Relative: 9 % (ref 3–12)
Neutro Abs: 12.4 10*3/uL — ABNORMAL HIGH (ref 1.7–7.7)
Platelets: 378 10*3/uL (ref 150–400)
RBC: 2.97 MIL/uL — ABNORMAL LOW (ref 3.87–5.11)
RDW: 13.4 % (ref 11.5–15.5)
WBC: 16.1 10*3/uL — ABNORMAL HIGH (ref 4.0–10.5)

## 2013-06-16 LAB — URINE CULTURE

## 2013-06-16 LAB — HEMOGLOBIN AND HEMATOCRIT, BLOOD
HCT: 24.2 % — ABNORMAL LOW (ref 36.0–46.0)
Hemoglobin: 8.5 g/dL — ABNORMAL LOW (ref 12.0–15.0)

## 2013-06-16 LAB — TROPONIN I: Troponin I: <0.3 ng/mL (ref <0.30)

## 2013-06-16 LAB — MAGNESIUM: Magnesium: 1.8 mg/dL (ref 1.5–2.5)

## 2013-06-16 MED ORDER — CIPROFLOXACIN HCL 500 MG PO TABS
500.0000 mg | ORAL_TABLET | Freq: Two times a day (BID) | ORAL | Status: DC
  Filled 2013-06-16 (×3): qty 1

## 2013-06-16 MED ORDER — CIPROFLOXACIN IN D5W 400 MG/200ML IV SOLN
400.0000 mg | Freq: Two times a day (BID) | INTRAVENOUS | Status: DC
Start: 2013-06-16 — End: 2013-06-22
  Administered 2013-06-16 – 2013-06-22 (×12): 400 mg via INTRAVENOUS
  Filled 2013-06-16 (×13): qty 200

## 2013-06-16 MED ORDER — ENSURE COMPLETE PO LIQD
237.0000 mL | Freq: Three times a day (TID) | ORAL | Status: DC
  Administered 2013-06-17 – 2013-06-21 (×8): 237 mL via ORAL

## 2013-06-16 MED ORDER — SODIUM CHLORIDE 0.9 % IV SOLN
INTRAVENOUS | Status: DC
  Administered 2013-06-16 – 2013-06-17 (×3): via INTRAVENOUS
  Administered 2013-06-18: 1000 mL via INTRAVENOUS
  Administered 2013-06-19: 16:00:00 via INTRAVENOUS

## 2013-06-16 NOTE — Progress Notes (Signed)
Patient taken to CT at this time.

## 2013-06-16 NOTE — Progress Notes (Addendum)
Chaplain follow up with pt.  Pt lying in bed, confused, moved toward being vigilant and fearful during conversation - describing feeling that she was being held against her will and "experimented on."  Pt easily redirected to calming thoughts.  Tangential in conversation.  Chaplain provided calm presence, emotional support, worked to provide sense of safety .  Pt stated that when at home she finds peace in praying and saying the Lord's Prayer.   Chaplain prayed with pt and worked to reinforce team's care for pt.  Will continue to follow for emotional support.   Belva Crome MDiv

## 2013-06-16 NOTE — Progress Notes (Signed)
NUTRITION FOLLOW UP  Intervention:   - Ensure Complete TID - Recommend nursing continue to encourage PO intake when pt awake/alert - Will continue to monitor   Nutrition Dx:   Inadequate oral intake related to inability to eat as evidenced by NPO - ongoing but now related to delirium, poor appetite as evidenced by <10% meal intake.    Goal:   Advance diet as tolerated to regular diet - met  New goal: Pt to consume >90% of meals/supplements   Monitor:   Weights, labs, intake  Assessment:   Admitted with right hip pain after a fall. Patient has a past medical history significant for chronic low back pain, IBS, anemia, depression and anxiety disorder. She also reports that she has had diarrhea over the past 2 days. The patient lives at home with her husband.   11/19 - Per conversation with husband over the phone, pt has been sleeping a lot for the past 3 weeks. He reports pt probably eats only 1.5 meals/day however her weight has been stable. States she snacks on cereal, chips, and ice cream. Was not on any nutritional supplements. Reports she has had diarrhea for years, sometimes as many as 5 times in 30 minutes. Reports she typically has at least 1-2 episodes/day. States she was seen at [facility] for this issue and was pt on Bentyl however he states she was on it for so long that it wasn't effective anymore so she was not on it recently. Had right hip hemiarthroplasty today.   11/25 - Per conversation with nursing, pt eating minimally. Pt ate only 5% of breakfast this morning, however earlier this week there are a few meals documented at 50% and 75%. No new weights. Per MD, pt still having hallucinations. Potassium slightly low - getting multivitamin.    Height: Ht Readings from Last 1 Encounters:  06/09/13 5\' 3"  (1.6 m)    Weight Status:   Wt Readings from Last 1 Encounters:  06/09/13 128 lb 15.5 oz (58.5 kg)    Re-estimated needs:  Kcal: 1450-1650  Protein: 60-70g  Fluid:  1.4-1.6L/day   Skin: Intact   Diet Order: General   Intake/Output Summary (Last 24 hours) at 06/16/13 1559 Last data filed at 06/16/13 0930  Gross per 24 hour  Intake    720 ml  Output    500 ml  Net    220 ml    Last BM: 11/24, formed   Labs:   Recent Labs Lab 06/14/13 0523 06/15/13 0505 06/16/13 0445  NA 136 135 135  K 3.1* 2.7* 3.4*  CL 98 98 99  CO2 30 23 25   BUN 7 6 13   CREATININE 0.58 0.51 0.60  CALCIUM 8.8 8.7 8.6  MG  --  1.7 1.8  GLUCOSE 123* 181* 177*    CBG (last 3)  No results found for this basename: GLUCAP,  in the last 72 hours  Scheduled Meds: . ALPRAZolam  0.5 mg Oral TID  . aspirin EC  325 mg Oral Q breakfast  . carvedilol  6.25 mg Oral BID WC  . ciprofloxacin  400 mg Intravenous Q12H  . docusate sodium  100 mg Oral BID  . escitalopram  20 mg Oral Daily  . multivitamin with minerals  1 tablet Oral Daily  . pantoprazole  40 mg Oral Daily  . vancomycin  1,000 mg Intravenous Once    Continuous Infusions: . sodium chloride      Levon Hedger MS, RD, LDN [phone] Pager [phone] After  Hours Pager

## 2013-06-16 NOTE — Progress Notes (Signed)
Clinical Social Work  Patient was discussing during progression meeting and team reports that patient will return home with [facility] services and support of family. CSW is signing off but can be consulted if patient and family desire SNF placement.  [place], [place] [phone]
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
## 2013-06-16 NOTE — Progress Notes (Signed)
Patient complaining of pressure in chest.  EKG performed with results called to Dr. Joseph Art.  Patient's chest pain improved immediately after getting Xanax.  Philomena Doheny RN

## 2013-06-16 NOTE — Progress Notes (Signed)
Patient found on floor by NT at this time. Patient was trying to get out of bed onto the [facility] and she fell no visible injuries. Vital signs are as follow BP 131/92, HR 115, 99% on RA. Pt c/o pain to posterior head and rates pain 10/10. Daughter made aware that patient fell. MD also made aware and states she will come assess patient. Patient now has Recruitment consultant at bedside. Bed alarm placed back on bed and in low position. Will continue to monitor patient.

## 2013-06-17 ENCOUNTER — Inpatient Hospital Stay (HOSPITAL_COMMUNITY): Payer: Medicare Other

## 2013-06-17 DIAGNOSIS — D62 Acute posthemorrhagic anemia: Secondary | ICD-10-CM | POA: Diagnosis present

## 2013-06-17 LAB — CBC WITH DIFFERENTIAL/PLATELET
Basophils Absolute: 0 10*3/uL (ref 0.0–0.1)
Basophils Relative: 0 % (ref 0–1)
Eosinophils Relative: 2 % (ref 0–5)
HCT: 20 % — ABNORMAL LOW (ref 36.0–46.0)
Hemoglobin: 6.8 g/dL — CL (ref 12.0–15.0)
Lymphocytes Relative: 22 % (ref 12–46)
MCHC: 34 g/dL (ref 30.0–36.0)
MCV: 90.5 fL (ref 78.0–100.0)
Monocytes Absolute: 1.1 10*3/uL — ABNORMAL HIGH (ref 0.1–1.0)
Monocytes Relative: 9 % (ref 3–12)
RDW: 14.7 % (ref 11.5–15.5)

## 2013-06-17 LAB — BASIC METABOLIC PANEL
BUN: 14 mg/dL (ref 6–23)
CO2: 24 mEq/L (ref 19–32)
Calcium: 8.2 mg/dL — ABNORMAL LOW (ref 8.4–10.5)
Creatinine, Ser: 0.76 mg/dL (ref 0.50–1.10)
GFR calc Af Amer: >90 mL/min (ref >90)

## 2013-06-17 LAB — TYPE AND SCREEN
ABO/RH(D): A POS
Antibody Screen: NEGATIVE
Unit division: 0

## 2013-06-17 LAB — PREPARE RBC (CROSSMATCH)

## 2013-06-17 MED ORDER — LORAZEPAM 2 MG/ML IJ SOLN
2.0000 mg | Freq: Once | INTRAMUSCULAR | Status: AC
  Administered 2013-06-17: 2 mg via INTRAVENOUS
  Filled 2013-06-17: qty 1

## 2013-06-17 MED ORDER — DILTIAZEM HCL 25 MG/5ML IV SOLN
10.0000 mg | Freq: Once | INTRAVENOUS | Status: AC
  Administered 2013-06-17: 10 mg via INTRAVENOUS
  Filled 2013-06-17: qty 5
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
## 2013-06-17 NOTE — Progress Notes (Signed)
Called Daughter and left a message returning her phone call from earlier this evening.  Monserratt Knezevic, Aram Beecham DRN

## 2013-06-17 NOTE — Progress Notes (Signed)
Physical Therapy Treatment Patient Details Name: [name] MRN: [id number] DOB: 03-25-1935 Today's Date: 06/17/2013 Time: 0821-0901 PT Time Calculation (min): 40 min  PT Assessment / Plan / Recommendation  History of Present Illness 78 yo female s/p R hip hemiarthroplasty 11/19. Hx of chronic LBP (back surgery), anemia, anxiety.    PT Comments   **Assisted pt to bathroom. Pt stated she was fatigued after using bathroom and refused to walk in hallway. Performed RLE exercises. REady to DC to SNF from PT standpoint.*  Follow Up Recommendations  SNF     Does the patient have the potential to tolerate intense rehabilitation     Barriers to Discharge        Equipment Recommendations  None recommended by PT    Recommendations for Other Services    Frequency Min 3X/week   Progress towards PT Goals Progress towards PT goals: Goals met and updated - see care plan  Plan Current plan remains appropriate    Precautions / Restrictions Precautions Precautions: Posterior Hip;Fall Precaution Comments: Reviewed hip precautions and WB staus-pt unable to recall any.  Will need constant reminders likely.  Required Braces or Orthoses: Knee Immobilizer - Right (for safety, precautions) Knee Immobilizer - Right:  (in bed) Restrictions Weight Bearing Restrictions: No RLE Weight Bearing: Weight bearing as tolerated   Pertinent Vitals/Pain *8/10 R hip RN notified, ice applied**    Mobility  Bed Mobility Bed Mobility: Supine to Sit Supine to Sit: 3: Mod assist Details for Bed Mobility Assistance: Assist for R LE and to raise trunk. Multimodal cues for safety, adherence to precautions.  Transfers Transfers: Stand to Sit;Sit to Stand Sit to Stand: From bed;From chair/3-in-1;3: Mod assist Sit to Stand: Patient Percentage: 70% Stand to Sit: To chair/3-in-1;4: Min assist Stand to Sit: Patient Percentage: 80% Details for Transfer Assistance: Assist to rise, stabilize, control descent. VCS safety,  technique, R LE positioning.  Ambulation/Gait Ambulation/Gait Assistance: 4: Min assist Ambulation Distance (Feet): 28 Feet (14'x2) Assistive device: Rolling walker Gait Pattern: Step-through pattern;Trunk flexed;Antalgic Gait velocity: decr General Gait Details: flexed neck, pt didnt respond to VCs to lift head; VCs for sequencing and for positioning in RW    Exercises Total Joint Exercises Ankle Circles/Pumps: AROM;Both;10 reps;Seated Short Arc Quad: AAROM;Right;20 reps;Supine Heel Slides: AAROM;Right;15 reps;Supine Hip ABduction/ADduction: AAROM;10 reps;Seated Straight Leg Raises: AAROM;Right;10 reps;Supine   PT Diagnosis:    PT Problem List:   PT Treatment Interventions:     PT Goals (current goals can now be found in the care plan section) Acute Rehab PT Goals Patient Stated Goal: none stated Time For Goal Achievement: 06/25/13 Potential to Achieve Goals: Good  Visit Information  Last PT Received On: 06/17/13 Assistance Needed: +1 History of Present Illness: 78 yo female s/p R hip hemiarthroplasty 11/19. Hx of chronic LBP (back surgery), anemia, anxiety.     Subjective Data  Patient Stated Goal: none stated   Cognition  Cognition Arousal/Alertness: Awake/alert Behavior During Therapy: WFL for tasks assessed/performed Overall Cognitive Status: Impaired/Different from baseline Area of Impairment: Attention;Memory;Orientation;Safety/judgement Orientation Level: Disoriented to;Time;Situation Memory: Decreased recall of precautions;Decreased short-term memory Safety/Judgement: Decreased awareness of safety;Decreased awareness of deficits General Comments: pt requires constant muliti-modal cues to adhere to hip precautions,     Balance     End of Session PT - End of Session Equipment Utilized During Treatment: Gait belt Activity Tolerance: Patient tolerated treatment well Patient left: in chair;with call bell/phone within reach;with chair alarm set Nurse  Communication: Mobility status;Precautions;Weight bearing status   GP  [name] 06/17/2013, 9:06 AM [phone]

## 2013-06-17 NOTE — Progress Notes (Addendum)
Subjective: 7 Days Post-Op Procedure(s) (LRB): ARTHROPLASTY MONOPOLAR HIP CEMENTED (Right) Patient reports pain as mild.  Patient is confused.   Objective: Vital signs in last 24 hours: Temp:  [97.6 F (36.4 C)-97.9 F (36.6 C)] 97.6 F (36.4 C) (11/26 0500) Pulse Rate:  [80-122] 122 (11/26 0500) Resp:  [16-20] 16 (11/26 1115) BP: (100-138)/(61-86) 138/86 mmHg (11/26 0500) SpO2:  [95 %-98 %] 97 % (11/26 1115)  Intake/Output from previous day: 11/25 0701 - 11/26 0700 In: 930 [P.O.:420; I.V.:310; IV Piggyback:200] Out: 700 [Urine:700] Intake/Output this shift: Total I/O In: 240 [P.O.:240] Out: -    Recent Labs  06/15/13 0505 06/16/13 0445 06/16/13 1420  HGB 10.1* 9.1* 8.5*    Recent Labs  06/15/13 0505 06/16/13 0445 06/16/13 1420  WBC 11.1* 16.1*  --   RBC 3.39* 2.97*  --   HCT 29.3* 26.2* 24.2*  PLT 302 378  --     Recent Labs  06/15/13 0505 06/16/13 0445  NA 135 135  K 2.7* 3.4*  CL 98 99  CO2 23 25  BUN 6 13  CREATININE 0.51 0.60  GLUCOSE 181* 177*  CALCIUM 8.7 8.6   No results found for this basename: LABPT, INR,  in the last 72 hours  right hip dressing two small spots old blood. right hip and buttocks has some fullness with surrounding eccymosis. Thigh and calf are soft.    Assessment/Plan: 7 Days Post-Op Procedure(s) (LRB): ARTHROPLASTY MONOPOLAR HIP CEMENTED (Right) Up with therapy OOB to recliner. Needs someone to feed her to increase her PO intake.  Patient could not tolerate hip MRI. She has some hematoma of hip but not severe. Would guiaic stools since she is on ASA. Check Protime, PTT.      Her platelets have been adequate.   Scan of lumbar shows no severe stenosis, previous fusion and old compression fractures. Will call Dr. Joseph Art to discuss.     UTI being treated.                      Mohab Ashby cell [phone]  Marcelis Wissner C 06/17/2013, 11:35 AM Guaiac occult blood ordered. Dressing change new Mepilex.

## 2013-06-17 NOTE — Progress Notes (Signed)
CRITICAL VALUE ALERT  Critical value received:  hgb 6.8  Date of notification:  06/17/13  Time of notification:  1300  Critical value read back:yes  Nurse who received alert:  Ckeatts,rn    MD notified (1st page):  Woods,MD  Time of first page:    MD notified (2nd page):  Time of second page:  Responding MD:  Joseph Art  Time MD responded:  1315

## 2013-06-17 NOTE — Progress Notes (Addendum)
Pt sustaining a heart rate of 130 or above. No c/o chest pain. No c/o shortness of breath.  Vital signs stable with a bp of 138/86.  MD paged. Faylynn Stamos, Doran Durand, RN  Md called back order EKG.  Pt refused EKG. Order received to give 10 mg Cardiazem.  Orders carried out. Bell Carbo, Doran Durand, RN

## 2013-06-17 NOTE — Progress Notes (Signed)
Patients daughter, Hilda Lias, called to check on patient and was updated on current plan of care and treatment

## 2013-06-17 NOTE — Progress Notes (Signed)
Shift from 11 p to 7 am.  Agree with assessment from previous Rn who assessed at the beginning of the shift. Rebekkah Powless, Doran Durand, RN

## 2013-06-18 ENCOUNTER — Inpatient Hospital Stay (HOSPITAL_COMMUNITY): Payer: Medicare Other

## 2013-06-18 ENCOUNTER — Encounter (HOSPITAL_COMMUNITY): Payer: Self-pay | Admitting: Radiology

## 2013-06-18 LAB — CBC WITH DIFFERENTIAL/PLATELET
Eosinophils Absolute: 0.5 10*3/uL (ref 0.0–0.7)
Eosinophils Relative: 5 % (ref 0–5)
HCT: 28.5 % — ABNORMAL LOW (ref 36.0–46.0)
Lymphocytes Relative: 27 % (ref 12–46)
Lymphs Abs: 2.9 10*3/uL (ref 0.7–4.0)
MCHC: 34 g/dL (ref 30.0–36.0)
MCV: 88.2 fL (ref 78.0–100.0)
Neutro Abs: 6.1 10*3/uL (ref 1.7–7.7)
Neutrophils Relative %: 58 % (ref 43–77)
Platelets: 367 10*3/uL (ref 150–400)
RBC: 3.23 MIL/uL — ABNORMAL LOW (ref 3.87–5.11)
WBC: 10.5 10*3/uL (ref 4.0–10.5)

## 2013-06-18 LAB — TYPE AND SCREEN
ABO/RH(D): A POS
Antibody Screen: NEGATIVE
Unit division: 0
Unit division: 0

## 2013-06-18 LAB — COMPREHENSIVE METABOLIC PANEL
ALT: 19 U/L (ref 0–35)
Albumin: 2.6 g/dL — ABNORMAL LOW (ref 3.5–5.2)
BUN: 10 mg/dL (ref 6–23)
Creatinine, Ser: 0.64 mg/dL (ref 0.50–1.10)
GFR calc Af Amer: >90 mL/min (ref >90)
Total Bilirubin: 1.2 mg/dL (ref 0.3–1.2)
Total Protein: 5.6 g/dL — ABNORMAL LOW (ref 6.0–8.3)

## 2013-06-18 LAB — HEMOGLOBIN AND HEMATOCRIT, BLOOD
HCT: 28 % — ABNORMAL LOW (ref 36.0–46.0)
Hemoglobin: 9.7 g/dL — ABNORMAL LOW (ref 12.0–15.0)

## 2013-06-18 LAB — MAGNESIUM: Magnesium: 1.9 mg/dL (ref 1.5–2.5)

## 2013-06-18 MED ORDER — IOHEXOL 300 MG/ML  SOLN
80.0000 mL | Freq: Once | INTRAMUSCULAR | Status: AC | PRN
  Administered 2013-06-18: 80 mL via INTRAVENOUS

## 2013-06-18 MED ORDER — LORAZEPAM BOLUS VIA INFUSION: 2.0000 mg | INTRAVENOUS | Status: DC | PRN

## 2013-06-18 MED ORDER — LORAZEPAM 2 MG/ML IJ SOLN
2.0000 mg | INTRAMUSCULAR | Status: DC | PRN
  Administered 2013-06-18 – 2013-06-20 (×2): 2 mg via INTRAVENOUS
  Filled 2013-06-18 (×2): qty 1
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
## 2013-06-19 LAB — COMPREHENSIVE METABOLIC PANEL
ALT: 19 U/L (ref 0–35)
AST: 17 U/L (ref 0–37)
Albumin: 2.8 g/dL — ABNORMAL LOW (ref 3.5–5.2)
CO2: 27 mEq/L (ref 19–32)
Calcium: 8.7 mg/dL (ref 8.4–10.5)
Creatinine, Ser: 0.62 mg/dL (ref 0.50–1.10)
GFR calc non Af Amer: 85 mL/min — ABNORMAL LOW (ref >90)
Sodium: 137 mEq/L (ref 135–145)
Total Protein: 6.1 g/dL (ref 6.0–8.3)

## 2013-06-19 LAB — CBC WITH DIFFERENTIAL/PLATELET
Basophils Relative: 1 % (ref 0–1)
Eosinophils Absolute: 0.4 10*3/uL (ref 0.0–0.7)
HCT: 31.2 % — ABNORMAL LOW (ref 36.0–46.0)
Hemoglobin: 10.6 g/dL — ABNORMAL LOW (ref 12.0–15.0)
Lymphs Abs: 2.2 10*3/uL (ref 0.7–4.0)
MCH: 30.1 pg (ref 26.0–34.0)
MCHC: 34 g/dL (ref 30.0–36.0)
MCV: 88.6 fL (ref 78.0–100.0)
Monocytes Absolute: 1.1 10*3/uL — ABNORMAL HIGH (ref 0.1–1.0)
Monocytes Relative: 9 % (ref 3–12)
RBC: 3.52 MIL/uL — ABNORMAL LOW (ref 3.87–5.11)
RDW: 15.3 % (ref 11.5–15.5)

## 2013-06-19 MED ORDER — POTASSIUM CHLORIDE CRYS ER 20 MEQ PO TBCR
30.0000 meq | EXTENDED_RELEASE_TABLET | Freq: Once | ORAL | Status: AC
  Administered 2013-06-19: 16:00:00 30 meq via ORAL
  Filled 2013-06-19: qty 1

## 2013-06-19 MED ORDER — POTASSIUM CHLORIDE CRYS ER 20 MEQ PO TBCR
30.0000 meq | EXTENDED_RELEASE_TABLET | Freq: Once | ORAL | Status: AC
  Filled 2013-06-19: qty 1

## 2013-06-19 MED ORDER — POTASSIUM CHLORIDE 10 MEQ/100ML IV SOLN
10.0000 meq | INTRAVENOUS | Status: AC
  Filled 2013-06-19 (×2): qty 100
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
## 2013-06-19 NOTE — Progress Notes (Signed)
Rehab Admissions Coordinator Note:  Patient was screened by Trish Mage for appropriateness for an Inpatient Acute Rehab Consult.  At this time, we are recommending Skilled Nursing Facility.  Trish Mage 06/19/2013, 1:15 PM  I can be reached at [phone].

## 2013-06-19 NOTE — Progress Notes (Signed)
Physical Therapy Treatment Patient Details Name: Kimyata Milich MRN: [id number] DOB: 1935-03-02 Today's Date: 06/19/2013 Time: 1020-1055 PT Time Calculation (min): 35 min  PT Assessment / Plan / Recommendation  History of Present Illness 78 yo female s/p R hip hemiarthroplasty 11/19. Hx of chronic LBP (back surgery), anemia, anxiety.    PT Comments     Follow Up Recommendations  CIR (family declining SNF so advised dghtr about poss CIR)  If home will need 24/7 assist and HH PT pt has a RW, has a wheelchair and has a ramp    Does the patient have the potential to tolerate intense rehabilitation     Barriers to Discharge        Equipment Recommendations  None recommended by PT    Recommendations for Other Services    Frequency     Progress towards PT Goals Progress towards PT goals: Progressing toward goals  Plan      Precautions / Restrictions Precautions Precautions: Posterior Hip;Fall Precaution Comments: Reviewed hip precautions and WB staus-pt unable to recall any.  Will need constant reminders likely.  Restrictions Weight Bearing Restrictions: No RLE Weight Bearing: Weight bearing as tolerated    Pertinent Vitals/Pain C/o "alot" hip pain during act     Mobility  Bed Mobility Bed Mobility: Not assessed Details for Bed Mobility Assistance: Pt OOB on BSC on arrival Transfers Transfers: Stand to Sit;Sit to Stand Sit to Stand: 1: +2 Total assist;From toilet Sit to Stand: Patient Percentage: 70% Stand to Sit: To chair/3-in-1 Stand to Sit: Patient Percentage: 70% Details for Transfer Assistance: Assist to rise, stabilize, control descent. VCS safety, technique, R LE positioning.  Ambulation/Gait Ambulation/Gait Assistance: 1: +2 Total assist Ambulation/Gait: Patient Percentage: 50% Ambulation Distance (Feet): 10 Feet Assistive device: Rolling walker Ambulation/Gait Assistance Details: limited amb distance due to MAX c/o weakness and feeling weak.  Very unsteady gait  with difficulty supporting her own body weight.  Gait Pattern: Step-through pattern;Trunk flexed;Antalgic Gait velocity: decr     PT Goals (current goals can now be found in the care plan section)    Visit Information  Last PT Received On: 06/19/13 History of Present Illness: 78 yo female s/p R hip hemiarthroplasty 11/19. Hx of chronic LBP (back surgery), anemia, anxiety.     Subjective Data      Cognition       Balance     End of Session PT - End of Session Equipment Utilized During Treatment: Gait belt Activity Tolerance: Patient tolerated treatment well Patient left: in chair;with call bell/phone within reach;with chair alarm set Nurse Communication: Mobility status;Precautions;Weight bearing status   Felecia Shelling  PTA [facility]  Acute  Rehab Pager      [phone]

## 2013-06-19 NOTE — Progress Notes (Signed)
Occupational Therapy Treatment Patient Details Name: [name] MRN: [id number] DOB: Dec 26, 1934 Today's Date: 06/19/2013 Time: 4782-9562 OT Time Calculation (min): 27 min  OT Assessment / Plan / Recommendation  History of present illness 78 yo female s/p R hip hemiarthroplasty 11/19. Hx of chronic LBP (back surgery), anemia, anxiety.    OT comments  Pt progressing slowly.  Remains confused.  After extensive review of THA precautions and hypothetical situations, she was able to recall THA precautions with min verbal cues  Follow Up Recommendations  SNF;Supervision/Assistance - 24 hour    Barriers to Discharge       Equipment Recommendations  3 in 1 bedside comode    Recommendations for Other Services    Frequency Min 2X/week   Progress towards OT Goals Progress towards OT goals: Progressing toward goals  Plan Discharge plan needs to be updated    Precautions / Restrictions Precautions Precautions: Posterior Hip;Fall Precaution Booklet Issued: Yes (comment) Precaution Comments: Pt requires mod verbal cues to recall THA precautions Restrictions Weight Bearing Restrictions: No RLE Weight Bearing: Weight bearing as tolerated   Pertinent Vitals/Pain     ADL  Lower Body Bathing: Maximal assistance Where Assessed - Lower Body Bathing: Supported sit to stand Lower Body Dressing: Maximal assistance Where Assessed - Lower Body Dressing: Supported sit to Pharmacist, hospital: Moderate assistance Toilet Transfer Method: Sit to Barista: Bedside commode Toileting - Clothing Manipulation and Hygiene: +1 Total assistance Where Assessed - Toileting Clothing Manipulation and Hygiene: Sit to stand from 3-in-1 or toilet ADL Comments: Extensively reviewed THA precautions with pt including hypothetical situations.  She initially was unable to recall any precautions, however at end of session, pt was able to verbalize precautions with min verbal cues and able to answer  hypothetical safety questions with min verbal cues    OT Diagnosis:    OT Problem List:   OT Treatment Interventions:     OT Goals(current goals can now be found in the care plan section) Acute Rehab OT Goals Patient Stated Goal: none stated OT Goal Formulation: Patient unable to participate in goal setting Time For Goal Achievement: 06/26/13 Potential to Achieve Goals: Good ADL Goals Pt Will Perform Lower Body Dressing: with mod assist;with adaptive equipment;sit to/from stand Pt Will Transfer to Toilet: with min assist;bedside commode;stand pivot transfer Pt Will Perform Toileting - Clothing Manipulation and hygiene: with mod assist;sit to/from stand Additional ADL Goal #1: pt will be able to verbalize 3/3 hip precautions with min ? cues  Visit Information  Last OT Received On: 06/19/13 Assistance Needed: +1 History of Present Illness: 78 yo female s/p R hip hemiarthroplasty 11/19. Hx of chronic LBP (back surgery), anemia, anxiety.     Subjective Data      Prior Functioning       Cognition  Cognition Arousal/Alertness: Awake/alert Behavior During Therapy: WFL for tasks assessed/performed Overall Cognitive Status: Impaired/Different from baseline Area of Impairment: Attention;Memory;Orientation;Safety/judgement Orientation Level: Disoriented to;Time Current Attention Level: Sustained Memory: Decreased recall of precautions;Decreased short-term memory Safety/Judgement: Decreased awareness of safety;Decreased awareness of deficits    Mobility  Transfers Transfers: Sit to Stand;Stand to Sit Sit to Stand: 3: Mod assist;From chair/3-in-1 Stand to Sit: 3: Mod assist;To chair/3-in-1    Exercises      Balance     End of Session OT - End of Session Activity Tolerance: Patient tolerated treatment well Patient left: in chair;with call bell/phone within reach;with chair alarm set Nurse Communication: Mobility status  GO  Jeani Hawking M 06/19/2013, 6:36 PM

## 2013-06-19 NOTE — Progress Notes (Signed)
Family may be reconsidering snf. CSW may be reconsulted. Will follow for possible snf needs.  Franco Duley C. Defne Gerling MSW, LCSW [phone]

## 2013-06-20 LAB — CBC WITH DIFFERENTIAL/PLATELET
Basophils Absolute: 0 10*3/uL (ref 0.0–0.1)
Basophils Relative: 0 % (ref 0–1)
Eosinophils Absolute: 0.3 10*3/uL (ref 0.0–0.7)
Eosinophils Relative: 3 % (ref 0–5)
HCT: 31.2 % — ABNORMAL LOW (ref 36.0–46.0)
Hemoglobin: 10.7 g/dL — ABNORMAL LOW (ref 12.0–15.0)
Lymphocytes Relative: 19 % (ref 12–46)
Lymphs Abs: 2 10*3/uL (ref 0.7–4.0)
MCH: 30.6 pg (ref 26.0–34.0)
MCHC: 34.3 g/dL (ref 30.0–36.0)
MCV: 89.1 fL (ref 78.0–100.0)
Monocytes Absolute: 1 10*3/uL (ref 0.1–1.0)
Monocytes Relative: 10 % (ref 3–12)
Neutro Abs: 6.7 10*3/uL (ref 1.7–7.7)
Neutrophils Relative %: 67 % (ref 43–77)
Platelets: 517 10*3/uL — ABNORMAL HIGH (ref 150–400)
RBC: 3.5 MIL/uL — ABNORMAL LOW (ref 3.87–5.11)
RDW: 15.3 % (ref 11.5–15.5)
WBC: 10.1 10*3/uL (ref 4.0–10.5)

## 2013-06-20 LAB — COMPREHENSIVE METABOLIC PANEL
ALT: 19 U/L (ref 0–35)
Albumin: 2.8 g/dL — ABNORMAL LOW (ref 3.5–5.2)
Alkaline Phosphatase: 87 U/L (ref 39–117)
BUN: 8 mg/dL (ref 6–23)
CO2: 26 mEq/L (ref 19–32)
Chloride: 103 mEq/L (ref 96–112)
Creatinine, Ser: 0.62 mg/dL (ref 0.50–1.10)
GFR calc Af Amer: >90 mL/min (ref >90)
GFR calc non Af Amer: 85 mL/min — ABNORMAL LOW (ref >90)
Glucose, Bld: 121 mg/dL — ABNORMAL HIGH (ref 70–99)
Potassium: 3.2 mEq/L — ABNORMAL LOW (ref 3.5–5.1)
Total Bilirubin: 1.1 mg/dL (ref 0.3–1.2)

## 2013-06-20 LAB — MAGNESIUM: Magnesium: 2 mg/dL (ref 1.5–2.5)

## 2013-06-20 MED ORDER — ALUM & MAG HYDROXIDE-SIMETH 200-200-20 MG/5ML PO SUSP
30.0000 mL | Freq: Once | ORAL | Status: AC
  Administered 2013-06-20: 30 mL via ORAL
  Filled 2013-06-20: qty 30

## 2013-06-20 MED ORDER — IBUPROFEN 400 MG PO TABS
400.0000 mg | ORAL_TABLET | Freq: Once | ORAL | Status: AC
  Administered 2013-06-20: 400 mg via ORAL
  Filled 2013-06-20: qty 1

## 2013-06-20 MED ORDER — METHOCARBAMOL 500 MG PO TABS
500.0000 mg | ORAL_TABLET | Freq: Once | ORAL | Status: AC
  Administered 2013-06-20: 500 mg via ORAL
  Filled 2013-06-20: qty 1

## 2013-06-20 MED ORDER — LISINOPRIL 5 MG PO TABS
5.0000 mg | ORAL_TABLET | Freq: Every day | ORAL | Status: DC
  Administered 2013-06-20 – 2013-06-22 (×3): 5 mg via ORAL
  Filled 2013-06-20 (×3): qty 1

## 2013-06-20 MED ORDER — POTASSIUM CHLORIDE CRYS ER 20 MEQ PO TBCR
20.0000 meq | EXTENDED_RELEASE_TABLET | Freq: Once | ORAL | Status: AC
  Administered 2013-06-20: 20 meq via ORAL
  Filled 2013-06-20: qty 1

## 2013-06-20 NOTE — Progress Notes (Signed)
Leg lengths equal Toe df ok Placement pending

## 2013-06-20 NOTE — Progress Notes (Signed)
Physical Therapy Treatment Patient Details Name: [name] MRN: [id number] DOB: [birth year] Today's Date: 06/20/2013 Time: 1005-1030 PT Time Calculation (min): 25 min  PT Assessment / Plan / Recommendation  History of Present Illness 78 yo female s/p R hip hemiarthroplasty 11/19. Hx of chronic LBP (back surgery), anemia, anxiety.    PT Comments   **Ambulated 30' today, pt groggy, difficult to arouse, but was more alert after walking. *  Follow Up Recommendations  Home health PT (family declining SNF so advised dghtr about poss CIR)     Does the patient have the potential to tolerate intense rehabilitation     Barriers to Discharge        Equipment Recommendations  None recommended by PT    Recommendations for Other Services    Frequency Min 3X/week   Progress towards PT Goals Progress towards PT goals: Progressing toward goals  Plan Current plan remains appropriate    Precautions / Restrictions Precautions Precautions: Posterior Hip;Fall Precaution Booklet Issued: Yes (comment) Precaution Comments: Pt requires mod verbal cues to recall THA precautions Restrictions Weight Bearing Restrictions: No RLE Weight Bearing: Weight bearing as tolerated   Pertinent Vitals/Pain *3/10 R hip premedicated  Ice applied to R hip**    Mobility  Bed Mobility Bed Mobility: Supine to Sit Supine to Sit: 2: Max assist Supine to Sit: Patient Percentage: 50% Details for Bed Mobility Assistance: Assist for R LE and to raise trunk. Multimodal cues for safety, adherence to precautions.  Transfers Transfers: Stand to Sit;Sit to Stand Sit to Stand: From bed;From chair/3-in-1;2: Max assist Sit to Stand: Patient Percentage: 50% Stand to Sit: To chair/3-in-1;4: Min assist;With armrests;With upper extremity assist Stand to Sit: Patient Percentage: 80% Details for Transfer Assistance: Assist to rise, stabilize, control descent. VCS safety, technique, R LE positioning.   Ambulation/Gait Ambulation/Gait Assistance: 3: Mod assist Ambulation/Gait: Patient Percentage: 70% Ambulation Distance (Feet): 18 Feet Assistive device: Rolling walker Gait Pattern: Step-through pattern;Trunk flexed;Antalgic;Narrow base of support Gait velocity: decr General Gait Details: flexed neck, pt didnt respond to VCs to lift head; VCs for sequencing and for positioning in RW    Exercises Total Joint Exercises Ankle Circles/Pumps: AROM;Both;10 reps;Seated Heel Slides: AAROM;Right;15 reps;Supine Hip ABduction/ADduction: AAROM;10 reps;Seated   PT Diagnosis:    PT Problem List:   PT Treatment Interventions:     PT Goals (current goals can now be found in the care plan section) Acute Rehab PT Goals Patient Stated Goal: none stated Time For Goal Achievement: 06/25/13 Potential to Achieve Goals: Good  Visit Information  Last PT Received On: 06/20/13 Assistance Needed: +2 History of Present Illness: 78 yo female s/p R hip hemiarthroplasty 11/19. Hx of chronic LBP (back surgery), anemia, anxiety.     Subjective Data  Patient Stated Goal: none stated   Cognition  Cognition Arousal/Alertness: Lethargic Behavior During Therapy: WFL for tasks assessed/performed Overall Cognitive Status: Impaired/Different from baseline Area of Impairment: Attention;Memory;Orientation;Safety/judgement Orientation Level: Disoriented to;Time Current Attention Level: Sustained Memory: Decreased recall of precautions;Decreased short-term memory Safety/Judgement: Decreased awareness of safety;Decreased awareness of deficits General Comments: pt requires constant muliti-modal cues to adhere to hip precautions,     Balance     End of Session PT - End of Session Equipment Utilized During Treatment: Gait belt Activity Tolerance: Patient tolerated treatment well Patient left: in chair;with call bell/phone within reach;with chair alarm set Nurse Communication: Mobility status;Precautions;Weight  bearing status   GP     Ralene Bathe Kistler 06/20/2013, 10:37 AM [phone]
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
## 2013-06-21 DIAGNOSIS — S0990XA Unspecified injury of head, initial encounter: Secondary | ICD-10-CM

## 2013-06-21 DIAGNOSIS — D473 Essential (hemorrhagic) thrombocythemia: Secondary | ICD-10-CM

## 2013-06-21 LAB — CBC WITH DIFFERENTIAL/PLATELET
Basophils Absolute: 0.1 10*3/uL (ref 0.0–0.1)
Eosinophils Absolute: 0.5 10*3/uL (ref 0.0–0.7)
Eosinophils Relative: 4 % (ref 0–5)
HCT: 29.5 % — ABNORMAL LOW (ref 36.0–46.0)
Hemoglobin: 10 g/dL — ABNORMAL LOW (ref 12.0–15.0)
Lymphocytes Relative: 20 % (ref 12–46)
Lymphs Abs: 2.3 10*3/uL (ref 0.7–4.0)
MCH: 30.6 pg (ref 26.0–34.0)
MCHC: 33.9 g/dL (ref 30.0–36.0)
MCV: 90.2 fL (ref 78.0–100.0)
Monocytes Absolute: 1.2 10*3/uL — ABNORMAL HIGH (ref 0.1–1.0)
Monocytes Relative: 10 % (ref 3–12)
Platelets: 492 10*3/uL — ABNORMAL HIGH (ref 150–400)
RBC: 3.27 MIL/uL — ABNORMAL LOW (ref 3.87–5.11)
WBC: 11.3 10*3/uL — ABNORMAL HIGH (ref 4.0–10.5)

## 2013-06-21 LAB — COMPREHENSIVE METABOLIC PANEL
AST: 14 U/L (ref 0–37)
Albumin: 2.8 g/dL — ABNORMAL LOW (ref 3.5–5.2)
Alkaline Phosphatase: 91 U/L (ref 39–117)
BUN: 7 mg/dL (ref 6–23)
Creatinine, Ser: 0.61 mg/dL (ref 0.50–1.10)
Potassium: 3.3 mEq/L — ABNORMAL LOW (ref 3.5–5.1)
Total Bilirubin: 0.9 mg/dL (ref 0.3–1.2)
Total Protein: 6.1 g/dL (ref 6.0–8.3)

## 2013-06-21 LAB — CULTURE, BLOOD (ROUTINE X 2)
Culture: NO GROWTH
Culture: NO GROWTH

## 2013-06-21 LAB — CBC
HCT: 29.9 % — ABNORMAL LOW (ref 36.0–46.0)
Hemoglobin: 10.4 g/dL — ABNORMAL LOW (ref 12.0–15.0)
MCH: 31.4 pg (ref 26.0–34.0)
MCHC: 34.8 g/dL (ref 30.0–36.0)
RDW: 15 % (ref 11.5–15.5)

## 2013-06-21 LAB — OCCULT BLOOD X 1 CARD TO LAB, STOOL: Fecal Occult Bld: NEGATIVE

## 2013-06-21 MED ORDER — ALUM & MAG HYDROXIDE-SIMETH 200-200-20 MG/5ML PO SUSP
30.0000 mL | Freq: Once | ORAL | Status: AC
  Administered 2013-06-21: 30 mL via ORAL
  Filled 2013-06-21: qty 30

## 2013-06-21 MED ORDER — POTASSIUM CHLORIDE CRYS ER 20 MEQ PO TBCR
20.0000 meq | EXTENDED_RELEASE_TABLET | Freq: Two times a day (BID) | ORAL | Status: DC
  Administered 2013-06-22 (×2): 20 meq via ORAL
  Filled 2013-06-21 (×3): qty 1
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
## 2013-06-21 NOTE — Progress Notes (Signed)
  06/21/2013 1545 NCM notified [facility] of pt's scheduled dc home today or 12/01  with HH. HH RN will be added. Isidoro Donning RN CCM Case Mgmt phone [phone]

## 2013-06-21 NOTE — Progress Notes (Signed)
Physical Therapy Treatment Patient Details Name: [name] MRN: [id number] DOB: 11/23/1934 Today's Date: 06/21/2013 Time: 4098-1191 PT Time Calculation (min): 31 min  PT Assessment / Plan / Recommendation  History of Present Illness 78 yo female s/p R hip hemiarthroplasty 11/19. Hx of chronic LBP (back surgery), anemia, anxiety.    PT Comments   Progressing slowly with mobility. Note pt/family continuing to refuse SNF.   Follow Up Recommendations  SNF;Supervision/Assistance - 24 hour (SNF-pt/family refusing. CIR denial. Will require 24 hour supervision/assist with HHPT. )     Does the patient have the potential to tolerate intense rehabilitation     Barriers to Discharge        Equipment Recommendations  None recommended by PT    Recommendations for Other Services OT consult  Frequency Min 3X/week   Progress towards PT Goals Progress towards PT goals: Progressing toward goals  Plan Current plan remains appropriate    Precautions / Restrictions Precautions Precautions: Posterior Hip;Fall Precaution Comments: Pt continues to have difficulty recalling THA precautions. Will need constant/consistent reminders. Handout visible in room and pt able to locate them and read precautions.  Required Braces or Orthoses: Knee Immobilizer - Right (for safety, precaution adherence) Restrictions Weight Bearing Restrictions: No RLE Weight Bearing: Weight bearing as tolerated   Pertinent Vitals/Pain Abdomen, R hip with activity-5/10    Mobility  Bed Mobility Bed Mobility: Not assessed Transfers Transfers: Sit to Stand;Stand to Sit Sit to Stand: 2: Max assist;From chair/3-in-1;From toilet Stand to Sit: 3: Mod assist;To toilet;To chair/3-in-1 Details for Transfer Assistance: Assist to rise, stabilize, control descent. VCS safety, technique, R LE positioning.  Ambulation/Gait Ambulation/Gait Assistance: 3: Mod assist Ambulation Distance (Feet): 55 Feet Assistive device: Rolling  walker Ambulation/Gait Assistance Details: Assist to stabilize and manevuer with walker. Fatigues fairly easily. Unsteady with tendency to lose balance in posterior direction Gait Pattern: Step-to pattern;Decreased stride length;Antalgic;Trunk flexed    Exercises     PT Diagnosis:    PT Problem List:   PT Treatment Interventions:     PT Goals (current goals can now be found in the care plan section)    Visit Information  Last PT Received On: 06/21/13 Assistance Needed: +2 (safety) History of Present Illness: 78 yo female s/p R hip hemiarthroplasty 11/19. Hx of chronic LBP (back surgery), anemia, anxiety.     Subjective Data      Cognition  Cognition Arousal/Alertness: Awake/alert Behavior During Therapy: WFL for tasks assessed/performed Overall Cognitive Status: Impaired/Different from baseline Area of Impairment: Memory;Safety/judgement Memory: Decreased short-term memory;Decreased recall of precautions Safety/Judgement: Decreased awareness of safety;Decreased awareness of deficits General Comments: pt requires constant muliti-modal cues to adhere to hip precautions,     Balance     End of Session PT - End of Session Activity Tolerance: Patient limited by pain Patient left: in chair;with call bell/phone within reach   GP     Rebeca Alert, MPT Pager: [phone]

## 2013-06-22 DIAGNOSIS — N390 Urinary tract infection, site not specified: Secondary | ICD-10-CM | POA: Diagnosis present

## 2013-06-22 LAB — COMPREHENSIVE METABOLIC PANEL
ALT: 16 U/L (ref 0–35)
AST: 16 U/L (ref 0–37)
Albumin: 3 g/dL — ABNORMAL LOW (ref 3.5–5.2)
Alkaline Phosphatase: 105 U/L (ref 39–117)
Calcium: 8.9 mg/dL (ref 8.4–10.5)
GFR calc Af Amer: >90 mL/min (ref >90)
Potassium: 3.3 mEq/L — ABNORMAL LOW (ref 3.5–5.1)
Sodium: 137 mEq/L (ref 135–145)
Total Protein: 6.7 g/dL (ref 6.0–8.3)

## 2013-06-22 LAB — CBC WITH DIFFERENTIAL/PLATELET
Basophils Absolute: 0.1 10*3/uL (ref 0.0–0.1)
Basophils Relative: 1 % (ref 0–1)
Eosinophils Relative: 4 % (ref 0–5)
HCT: 31.9 % — ABNORMAL LOW (ref 36.0–46.0)
Lymphocytes Relative: 21 % (ref 12–46)
Lymphs Abs: 2.2 10*3/uL (ref 0.7–4.0)
MCHC: 34.2 g/dL (ref 30.0–36.0)
MCV: 89.6 fL (ref 78.0–100.0)
Monocytes Absolute: 0.9 10*3/uL (ref 0.1–1.0)
Monocytes Relative: 9 % (ref 3–12)
Neutro Abs: 6.8 10*3/uL (ref 1.7–7.7)
RBC: 3.56 MIL/uL — ABNORMAL LOW (ref 3.87–5.11)
RDW: 14.9 % (ref 11.5–15.5)

## 2013-06-22 LAB — OCCULT BLOOD X 1 CARD TO LAB, STOOL: Fecal Occult Bld: NEGATIVE

## 2013-06-22 MED ORDER — LISINOPRIL 5 MG PO TABS: 5.0000 mg | ORAL_TABLET | Freq: Every day | ORAL | Status: DC

## 2013-06-22 MED ORDER — CIPROFLOXACIN HCL 500 MG PO TABS
500.0000 mg | ORAL_TABLET | Freq: Two times a day (BID) | ORAL | Status: AC
  Administered 2013-06-22: 500 mg via ORAL
  Filled 2013-06-22: qty 1

## 2013-06-22 MED ORDER — ALPRAZOLAM 0.5 MG PO TABS: 0.5000 mg | ORAL_TABLET | Freq: Three times a day (TID) | ORAL | Status: DC | PRN

## 2013-06-22 MED ORDER — ENSURE COMPLETE PO LIQD: 237.0000 mL | Freq: Three times a day (TID) | ORAL | Status: DC

## 2013-06-22 NOTE — Discharge Summary (Addendum)
Physician Discharge Summary  [name] OZH:[id number] DOB: 10/02/1934 DOA: 06/09/2013  PCP: [name], MD  Admit date: 06/09/2013 Discharge date: 06/22/2013-anticipated  Time spent: 35 minutes  Recommendations for Outpatient Follow-up:  1. Patient is being discharged to home rather than a skilled nursing facility. Home health social work will followup in case placement is desired from home 2. She'll follow up with her PCP in one month 3. She will followup with Dr. Ophelia Charter orthopedic surgery in 2 weeks 4. Patient is allowed full weightbearing as tolerated with walker 5. Due to persistent elevated blood pressures, lisinopril added to patient's medication regimen 6. Recommend for skilled nursing to check a CBC looking at platelet count in one week and then at 2 weeks.  Discharge Diagnoses:  Principal Problem:   Hip fracture, right Active Problems:   HYPERLIPIDEMIA   GERD   OSTEOARTHRITIS, KNEE, RIGHT   IRRITABLE BOWEL SYNDROME, HX OF   Hyponatremia   Dehydration   GAD (generalized anxiety disorder)   Acute right hip pain   Protein-calorie malnutrition, severe   Acute blood loss anemia   Hypokalemia   Acute delirium   Anemia due to blood loss, acute   Thrombocytosis  UTI  Discharge Condition: Improved, being discharged to home  Diet recommendation: Low sodium heart healthy  Filed Weights   06/09/13 1806  Weight: 58.5 kg (128 lb 15.5 oz)    History of present illness:  78 year old female with history of gait instability had several days of increasing imbalance as well as some mild diarrhea and presented on 11/18 after a mechanical fall when she slipped on the stairs. She had a contusion to the left side of her head but there was no loss of consciousness is reported by her husband who did not witness the fall but tended to immediately afterwards. Patient was brought into the emergency room where she was noted to have an acute right-sided hip fracture. This was admitted  to the hospitalist service with orthopedic consultation.  Hospital Course:  Principal Problem:   Hip fracture, right: Orthopedic surgery did a right hip hemiarthroplasty done 11/20. Initially there is no complications, but patient started having persistent acute blood loss anemia. Blood loss anemia is out of proportion with surgery and MRI was ordered. Orthopedic noted that MRI of right hip showed 10.5 x 8 x 10 hematoma extending into right gluteal musculature in the subcutaneous fat of right buttock. CT scan of abdomen and pelvis confirmed this and also noted no signs of retroperitoneal bleed. Patient was transfused 2 units packed red blood cells. Currently felt to be stable. Initially she was resistant to going to skilled nursing facility, but since then has relented. Since that time, after family meeting, but the patient and her family would like to go home now. Plan will be for social work home health to followup in case placement is decided in the future She is felt to be medically stable. She'll followup with orthopedic surgery in 2 weeks. She is allowed full weightbearing with walker and assistance. Active Problems:   HYPERLIPIDEMIA: Stable.    GERD: Stable. On PPI.    OSTEOARTHRITIS, KNEE, RIGHT: Stable. No complaints from patient at this time. Patient on tramadol when necessary.    IRRITABLE BOWEL SYNDROME, HX OF colon patient previously on Bentyl which was discontinued because of patient's delirium. She persisted having some repeat diarrhea, but now that has since resolved. Back to baseline    Hyponatremia: Felt to be secondary to dehydration. Patient on admission had a  sodium of 126 on gentle IV fluids over the next 2 days, the patient's sodium continued to improve and has been stable since 11/23. He was at 137 by day of discharge.    Dehydration: Felt to be secondary initially to pain and fall, plus mild diarrhea from IBS. Resolved.    GAD (generalized anxiety disorder): Stable.  Continue on when necessary Xanax.    Acute right hip pain: Stable. Initially from fall and fracture and then postop surgery.    Protein-calorie malnutrition, severe: Given by context of chronic illness with 75% or less estimated energy intake for the past 3 weeks in addition the patient severe muscle wasting and subcutaneous fat loss, patient needs criteria for severe protein calorie malnutrition. Nutrition recommended and patient has been started on Ensure shakes multiple times per day    Acute blood loss anemia: On admission, patient's hemoglobin was 9.0 and following surgery drop down to as low as 8.5. However by 1126 cannot even further down to 6.8 chronic units packed red blood cell transfusion. As above, workup revealed hematoma in the right hip area. Stabilized. Patient's hemoglobin has been unchanged since 11/27. Hemoglobin on day of discharge was 10.9.    Hypokalemia: Magnesium levels checked and found to be stable. Patient has been on normal saline likely contributing to patient's hypokalemia. Replace. Potassium 3.3 on day of discharge and she received K-Dur 20 no clearance    Acute delirium: Initially patient Xanax was held and patient was having hallucinations and delusions. Xanax was resumed and patient became a bit more appropriate alert and interactive. Be possibly from mild benzo withdrawal plus hospital psychosis    Thrombocytosis : Felt to be secondary to stress margination from acute blood loss plus infection. Recommendation is for checking repeat CBC in one week and then in 2 weeks. If elevated further  significantly, to check for other infection. Patient appears currently quite stable.  UTI: Cultures were positive for Klebsiella. Patient started on IV Cipro which was sensitive. Patient to 7 days therapy which was completed on 12/1 prior to discharge.  Procedures:  Status post right-sided hip hemiarthroplasty done 11/20  Consultations:  Dr. Ludwig Clarks  surgery  Discharge Exam: Filed Vitals:   06/22/13 0500  BP: 153/93  Pulse:   Temp: 97.9 F (36.6 C)  Resp:     General: Alert and oriented x2, no acute distress Cardiovascular: Regular rate and rhythm, S1-S2, soft 2/6 systolic ejection murmur Respiratory: Clear to auscultation bilaterally Abdomen: Soft, nontender, nondistended, positive bowel sounds  Extremities: No clubbing or cyanosis or edema  Discharge Instructions  Discharge Orders   Future Orders Complete By Expires   Full weight bearing  As directed    Questions:     Laterality:     Extremity:     Full weight bearing  As directed    Questions:     Laterality:     Extremity:     Posterior total hip precautions  As directed        Medication List    STOP taking these medications       calcium-vitamin D 500-200 MG-UNIT per tablet  Commonly known as:  OSCAL WITH D     IMODIUM PO      TAKE these medications       ALPRAZolam 0.5 MG tablet  Commonly known as:  XANAX  Take 1 tablet (0.5 mg total) by mouth 3 (three) times daily as needed for anxiety or sleep. For anxiety.     aspirin 325  MG tablet  Commonly known as:  BAYER ASPIRIN  Take 1 tablet (325 mg total) by mouth daily.     COREG 6.25 MG tablet  Generic drug:  carvedilol  Take 6.25 mg by mouth 2 (two) times daily with a meal.     dicyclomine 20 MG tablet  Commonly known as:  BENTYL  Take 20 mg by mouth 3 (three) times daily.     escitalopram 10 MG tablet  Commonly known as:  LEXAPRO  Take 20 mg by mouth daily.     feeding supplement (ENSURE COMPLETE) Liqd  Take 237 mLs by mouth 3 (three) times daily with meals.     lisinopril 5 MG tablet  Commonly known as:  PRINIVIL,ZESTRIL  Take 1 tablet (5 mg total) by mouth daily.     multivitamins ther. w/minerals Tabs tablet  Take 1 tablet by mouth daily.     nitroGLYCERIN 0.4 MG SL tablet  Commonly known as:  NITROSTAT  Place 1 tablet (0.4 mg total) under the tongue every 5 (five) minutes as  needed. For chest pain.     omeprazole 20 MG capsule  Commonly known as:  PRILOSEC  Take 20 mg by mouth daily.     traMADol 50 MG tablet  Commonly known as:  ULTRAM  Take 1 tablet (50 mg total) by mouth every 6 (six) hours as needed for moderate pain.     vitamin B-12 500 MCG tablet  Commonly known as:  CYANOCOBALAMIN  Take 500 mcg by mouth daily.       Allergies  Allergen Reactions  . Sulfamethoxazole-Trimethoprim Hives       Follow-up Information   Follow up with YATES,MARK C, MD. Schedule an appointment as soon as possible for a visit in 2 weeks.   Specialty:  Orthopedic Surgery   Contact information:   [address] [place] [address] [phone]       Follow up with Eldred Manges, MD In 2 weeks.   Specialty:  Orthopedic Surgery   Contact information:   [address] [address] [place] [address] [phone]       Follow up with [name], MD In 1 month.   Specialty:  Family Medicine   Contact information:   [address] [place] [address] [phone]        The results of significant diagnostics from this hospitalization (including imaging, microbiology, ancillary and laboratory) are listed below for reference.    Significant Diagnostic Studies: Dg Chest 1 View  06/09/2013     IMPRESSION: No acute cardiopulmonary abnormality.   Electronically Signed   By: Augusto Gamble M.D.   On: 06/09/2013 12:27   Dg Thoracic Spine 2 View  06/16/2013  .  IMPRESSION: No acute thoracic spine fracture.  L1 vertebral body compression fracture which appears new compared to previous exams. Recommend lumbar spine CT without contrast for further evaluation.   Electronically Signed   By: Myles Rosenthal M.D.   On: 06/16/2013 00:39   Dg Hip Complete Right  06/16/2013  IMPRESSION: Increased right hip and pelvic soft tissue hematoma.  Expected postoperative appearance of right hip prosthesis. No evidence of fracture or dislocation.   Electronically Signed   By:  Myles Rosenthal M.D.   On: 06/16/2013 00:34   Dg Hip Complete Right  06/15/2013   .  IMPRESSION: Postoperative changes of right hip arthroplasty without evidence of hardware complication or periprosthetic fracture.   Electronically Signed   By: Malachy Moan  M.D.   On: 06/15/2013 07:58   Dg Hip Complete Right  06/09/2013     IMPRESSION: Acute right femoral neck fracture with varus impaction.   Electronically Signed   By: Augusto Gamble M.D.   On: 06/09/2013 12:27   Ct Head Wo Contrast  06/16/2013   CLINICAL DATA:  Patient fell tonight.  Injury to right post  EXAM: CT HEAD WITHOUT CONTRAST  CT CERVICAL SPINE WITHOUT CONTRAST  TECHNIQUE: Multidetector CT imaging of the head and cervical spine was performed following the standard protocol without intravenous contrast. Multiplanar CT image reconstructions of the cervical spine were also generated.  COMPARISON:  06/09/2013  FINDINGS: CT HEAD FINDINGS  Mild cerebral atrophy. Mild ventricular dilatation consistent with central atrophy. Patchy low-attenuation changes in the deep white matter consistent with small vessel ischemia. No mass effect or midline shift. No abnormal extra-axial fluid collections. Gray-white matter junctions are distinct. Basal cisterns are not effaced. No evidence of acute intracranial hemorrhage. No depressed skull fractures. Visualized paranasal sinuses and mastoid air cells are not opacified.  CT CERVICAL SPINE FINDINGS  There is increase in the usual cervical lordosis, likely due to thoracic kyphosis. There is diffuse degenerative change throughout the cervical spine with disc space narrowing and hypertrophic changes throughout all levels. Prominent degenerative changes at C1 to and throughout the cervical facet joints. No abnormal anterior subluxation of the cervical vertebrae. Alignment is stable since the previous study. No prevertebral soft tissue swelling. No vertebral compression deformities. Diffuse bone demineralization. Bone  cortex and trabecular architecture appears intact. Scarring and calcification in the right lung apex is probably postinflammatory.  IMPRESSION: No acute intracranial abnormalities demonstrated. No displaced fractures identified in the cervical spine. Stable appearance of chronic changes as described.   Electronically Signed   By: Burman Nieves M.D.   On: 06/16/2013 00:30   Ct Head Wo Contrast  06/09/2013   CLINICAL DATA:  Fall.  EXAM: CT HEAD WITHOUT CONTRAST  CT CERVICAL SPINE WITHOUT CONTRAST  TECHNIQUE: Multidetector CT imaging of the head and cervical spine was performed following the standard protocol without intravenous contrast. Multiplanar CT image reconstructions of the cervical spine were also generated.  COMPARISON:  07/15/2012 brain MR. 03/22/2009 head CT and cervical spine CT.  FINDINGS: CT HEAD FINDINGS  No skull fracture or intracranial hemorrhage.  Mild small vessel disease type changes without CT evidence of large acute infarct.  Global atrophy.  No intracranial mass lesion noted on this unenhanced exam  CT CERVICAL SPINE FINDINGS  No cervical spine fracture detected.  Cervical alignment and degenerative changes relatively similar to the prior exam including transverse ligament hypertrophy. No abnormal prevertebral soft tissue swelling. If there is a high clinical suspicion of ligamentous injury, flexion and extension views or MR can be performed for further delineation.  Scarring lung apices  IMPRESSION: No skull fracture or intracranial hemorrhage.  No cervical spine fracture detected.  Please see above.   Electronically Signed   By: Bridgett Larsson M.D.   On: 06/09/2013 12:54   Ct Cervical Spine Wo Contrast  06/16/2013   CLINICAL DATA:  Patient fell tonight.  Injury to right post  EXAM: CT HEAD WITHOUT CONTRAST  CT CERVICAL SPINE WITHOUT CONTRAST  TECHNIQUE: Multidetector CT imaging of the head and cervical spine was performed following the standard protocol without intravenous contrast.  Multiplanar CT image reconstructions of the cervical spine were also generated.  COMPARISON:  06/09/2013  FINDINGS: CT HEAD FINDINGS  Mild cerebral atrophy. Mild ventricular dilatation consistent  with central atrophy. Patchy low-attenuation changes in the deep white matter consistent with small vessel ischemia. No mass effect or midline shift. No abnormal extra-axial fluid collections. Gray-white matter junctions are distinct. Basal cisterns are not effaced. No evidence of acute intracranial hemorrhage. No depressed skull fractures. Visualized paranasal sinuses and mastoid air cells are not opacified.  CT CERVICAL SPINE FINDINGS  There is increase in the usual cervical lordosis, likely due to thoracic kyphosis. There is diffuse degenerative change throughout the cervical spine with disc space narrowing and hypertrophic changes throughout all levels. Prominent degenerative changes at C1 to and throughout the cervical facet joints. No abnormal anterior subluxation of the cervical vertebrae. Alignment is stable since the previous study. No prevertebral soft tissue swelling. No vertebral compression deformities. Diffuse bone demineralization. Bone cortex and trabecular architecture appears intact. Scarring and calcification in the right lung apex is probably postinflammatory.  IMPRESSION: No acute intracranial abnormalities demonstrated. No displaced fractures identified in the cervical spine. Stable appearance of chronic changes as described.   Electronically Signed   By: Burman Nieves M.D.   On: 06/16/2013 00:30   Ct Cervical Spine Wo Contrast  06/09/2013   CLINICAL DATA:  Fall.  EXAM: CT HEAD WITHOUT CONTRAST  CT CERVICAL SPINE WITHOUT CONTRAST  TECHNIQUE: Multidetector CT imaging of the head and cervical spine was performed following the standard protocol without intravenous contrast. Multiplanar CT image reconstructions of the cervical spine were also generated.  COMPARISON:  07/15/2012 brain MR. 03/22/2009  head CT and cervical spine CT.  FINDINGS: CT HEAD FINDINGS  No skull fracture or intracranial hemorrhage.  Mild small vessel disease type changes without CT evidence of large acute infarct.  Global atrophy.  No intracranial mass lesion noted on this unenhanced exam  CT CERVICAL SPINE FINDINGS  No cervical spine fracture detected.  Cervical alignment and degenerative changes relatively similar to the prior exam including transverse ligament hypertrophy. No abnormal prevertebral soft tissue swelling. If there is a high clinical suspicion of ligamentous injury, flexion and extension views or MR can be performed for further delineation.  Scarring lung apices  IMPRESSION: No skull fracture or intracranial hemorrhage.  No cervical spine fracture detected.  Please see above.   Electronically Signed   By: Bridgett Larsson M.D.   On: 06/09/2013 12:54   Ct Lumbar Spine Wo Contrast  06/16/2013   IMPRESSION: 1. Osteopenia. Chronic T12, L1, and L3 compression fractures. The L1 level is severely compressed and may have slightly lost height since 2013, but appears to be chronic. If specific therapy such as vertebroplasty is desired, lumbar MRI or whole-body bone scan would best determine acuity. 2. L4-L5 fusion and decompression is new since 2012. There is developing arthrodesis and no adverse postoperative features identified. 3. Stable multifactorial spinal stenosis at L2-L3 and L3-L4. 4. Posterior body wall subcutaneous edema, nonspecific, and often observed in debilitated patients.   Electronically Signed   By: Augusto Gamble M.D.   On: 06/16/2013 09:46   Ct Abdomen Pelvis W Contrast  06/18/2013     IMPRESSION: No evidence of retroperitoneal hemorrhage or hemoperitoneum.  Large hematoma again seen in the subcutaneous tissues lateral to the right hip, as demonstrated on previous MRI. Right hip prosthesis in appropriate position.   Electronically Signed   By: Myles Rosenthal M.D.   On: 06/18/2013 17:51   Dg Pelvis  Portable  06/11/2013     IMPRESSION: Total hip prosthesis on the right appears well seated. No fracture or dislocation apparent.  Electronically Signed   By: Bretta Bang M.D.   On: 06/11/2013 08:08   Mr Hip Right Wo Contrast  06/17/2013     IMPRESSION: Status post right hip replacement with a large heterogeneous collection extending out of the right gluteal musculature into subcutaneous fat most consistent with a hematoma.  Negative for fracture.   Electronically Signed   By: Drusilla Kanner M.D.   On: 06/17/2013 11:56    Microbiology: Recent Results (from the past 240 hour(s))  CULTURE, BLOOD (ROUTINE X 2)     Status: None   Collection Time    06/15/13 10:45 AM      Result Value Range Status   Specimen Description BLOOD RIGHT ARM   Final   Special Requests BOTTLES DRAWN AEROBIC AND ANAEROBIC 10CC   Final   Culture  Setup Time     Final   Value: 06/15/2013 14:43     Performed at [facility]   Culture     Final   Value: NO GROWTH 5 DAYS     Performed at [facility]   Report Status 06/21/2013 FINAL   Final  CULTURE, BLOOD (ROUTINE X 2)     Status: None   Collection Time    06/15/13 10:52 AM      Result Value Range Status   Specimen Description BLOOD RIGHT WRIST   Final   Special Requests BOTTLES DRAWN AEROBIC AND ANAEROBIC 10CC   Final   Culture  Setup Time     Final   Value: 06/15/2013 14:43     Performed at [facility]   Culture     Final   Value: NO GROWTH 5 DAYS     Performed at [facility]   Report Status 06/21/2013 FINAL   Final  URINE CULTURE     Status: None   Collection Time    06/15/13 11:22 AM      Result Value Range Status   Specimen Description URINE, RANDOM   Final   Special Requests Immunocompromised   Final   Culture  Setup Time     Final   Value: 06/15/2013 15:13     Performed at [facility] Count     Final   Value: >=100,000 COLONIES/ML     Performed at [facility]   Culture      Final   Value: KLEBSIELLA PNEUMONIAE     Performed at [facility]   Report Status 06/16/2013 FINAL   Final   Organism ID, Bacteria KLEBSIELLA PNEUMONIAE   Final     Labs: Basic Metabolic Panel:  Recent Labs Lab 06/18/13 0443 06/19/13 0450 06/20/13 0515 06/21/13 0516 06/22/13 0530  NA 138 137 140 140 137  K 3.3* 3.1* 3.2* 3.3* 3.3*  CL 104 100 103 103 101  CO2 24 27 26 26 25   GLUCOSE 111* 117* 121* 106* 117*  BUN 10 7 8 7 7   CREATININE 0.64 0.62 0.62 0.61 0.59  CALCIUM 8.3* 8.7 8.9 8.6 8.9  MG 1.9 2.0 2.0 2.0 2.1   Liver Function Tests:  Recent Labs Lab 06/18/13 0443 06/19/13 0450 06/20/13 0515 06/21/13 0516 06/22/13 0530  AST 18 17 16 14 16   ALT 19 19 19 15 16   ALKPHOS 69 79 87 91 105  BILITOT 1.2 1.1 1.1 0.9 0.9  PROT 5.6* 6.1 6.3 6.1 6.7  ALBUMIN 2.6* 2.8* 2.8* 2.8* 3.0*   No results found for this basename: LIPASE, AMYLASE,  in the last 168 hours No results found for this basename: AMMONIA,  in the last 168 hours CBC:  Recent Labs Lab 06/18/13 0443 06/19/13 0450 06/20/13 0515 06/21/13 0516 06/21/13 1602 06/22/13 0530  WBC 10.5 11.9* 10.1 11.3* 11.4* 10.3  NEUTROABS 6.1 8.2* 6.7 7.3  --  6.8  HGB 9.7* 10.6* 10.7* 10.0* 10.4* 10.9*  HCT 28.5* 31.2* 31.2* 29.5* 29.9* 31.9*  MCV 88.2 88.6 89.1 90.2 90.3 89.6  PLT 367 453* 517* 492* 528* 564*   Cardiac Enzymes:  Recent Labs Lab 06/16/13 1240  TROPONINI <0.30      Signed:  Hollice Espy  [facility] 06/22/2013, 9:34 AM

## 2013-06-22 NOTE — Progress Notes (Signed)
Occupational Therapy Treatment Patient Details Name: Rosalinda Seaman MRN: [id number] DOB: 08-Mar-1935 Today's Date: 06/22/2013 Time: 4098-1191 OT Time Calculation (min): 23 min  OT Assessment / Plan / Recommendation  History of present illness 78 yo female s/p R hip hemiarthroplasty 11/19. Hx of chronic LBP (back surgery), anemia, anxiety.    OT comments  DC plan now home per pt  Follow Up Recommendations  Home health OT;Other (comment) (pt has decided to go home , although OT reccomends SNF)       Equipment Recommendations  None recommended by OT             Plan Discharge plan needs to be updated    Precautions / Restrictions Precautions Precautions: Posterior Hip;Fall Precaution Comments: Pt continues to have difficulty recalling THA precautions. Will need constant/consistent reminders. Handout visible in room and pt able to locate them and read precautions.  Restrictions Weight Bearing Restrictions: No RLE Weight Bearing: Weight bearing as tolerated       ADL  Grooming: Wash/dry hands;Set up Where Assessed - Grooming: Unsupported sitting Where Assessed - Upper Body Dressing: Supported sit to stand Lower Body Dressing: Maximal assistance Where Assessed - Lower Body Dressing: Supported sit to Pharmacist, hospital: Minimal assistance Toilet Transfer Method: Sit to stand;Stand pivot Toilet Transfer Equipment: Bedside commode ADL Comments: Pt has now decided to go home.  OT did review hip precautions. Pt does have a BSC .  OT also reccomends HHOT. Pt agrees.  OT did share with pt reccomendation of SNF but pt now wants to go home and feels husband can A her.      OT Goals(current goals can now be found in the care plan section)    Visit Information  Last OT Received On: 06/22/13 Assistance Needed: +2 (safety) History of Present Illness: 78 yo female s/p R hip hemiarthroplasty 11/19. Hx of chronic LBP (back surgery), anemia, anxiety.           Cognition   Cognition Arousal/Alertness: Awake/alert Behavior During Therapy: WFL for tasks assessed/performed Area of Impairment: Memory;Safety/judgement Memory: Decreased short-term memory;Decreased recall of precautions Safety/Judgement: Decreased awareness of safety;Decreased awareness of deficits General Comments: pt requires constant muliti-modal cues to adhere to hip precautions,     Mobility  Bed Mobility Bed Mobility: Supine to Sit;Not assessed Supine to Sit: 2: Max assist Details for Bed Mobility Assistance: Assist for R LE and to raise trunk. Multimodal cues for safety, adherence to precautions.  Transfers Transfers: Stand to Sit;Sit to Stand Sit to Stand: From bed;From elevated surface;4: Min assist Stand to Sit: 4: Min assist;To chair/3-in-1;With armrests Details for Transfer Assistance: Assist to rise, stabilize, control descent. VCS safety, technique, R LE positioning.           End of Session OT - End of Session Activity Tolerance: Patient tolerated treatment well Patient left: in chair Nurse Communication: Mobility status  GO     Alba Cory 06/22/2013, 12:00 PM

## 2013-06-22 NOTE — Progress Notes (Signed)
Physical Therapy Treatment Patient Details Name: [name] MRN: [id number] DOB: [birth year] Today's Date: 06/22/2013 Time: 3086-5784 PT Time Calculation (min): 24 min  PT Assessment / Plan / Recommendation  History of Present Illness 78 yo female s/p R hip hemiarthroplasty 11/19. Hx of chronic LBP (back surgery), anemia, anxiety.    PT Comments   Progressing with mobility. Discussed with pt the need for ST rehab.   Follow Up Recommendations  SNF (if pt agreeable. If not will need 24 hour care and HHPT)     Does the patient have the potential to tolerate intense rehabilitation     Barriers to Discharge        Equipment Recommendations  None recommended by PT    Recommendations for Other Services OT consult  Frequency Min 3X/week   Progress towards PT Goals Progress towards PT goals: Progressing toward goals  Plan Current plan remains appropriate    Precautions / Restrictions Precautions Precautions: Posterior Hip;Fall Precaution Comments: Pt continues to have difficulty recalling THA precautions. Will need constant/consistent reminders. Handout visible in room and pt able to locate them and read precautions.  Restrictions Weight Bearing Restrictions: No RLE Weight Bearing: Weight bearing as tolerated   Pertinent Vitals/Pain R LE "tender" with activity.     Mobility  Bed Mobility Bed Mobility: Supine to Sit Supine to Sit: 2: Max assist Details for Bed Mobility Assistance: Assist for R LE and to raise trunk. Multimodal cues for safety, adherence to precautions.  Transfers Transfers: Stand to Sit;Sit to Stand Sit to Stand: From bed;From elevated surface;3: Mod assist Stand to Sit: 4: Min assist;To chair/3-in-1;With armrests Details for Transfer Assistance: Assist to rise, stabilize, control descent. VCS safety, technique, R LE positioning.  Ambulation/Gait Ambulation/Gait Assistance: 3: Mod assist Ambulation Distance (Feet): 90 Feet Assistive device: Rolling  walker Ambulation/Gait Assistance Details: Assist to stabilize and maneuver with walker. Pt remains unsteady. VCs safety, sequencing, distance from walker.  Gait Pattern: Step-to pattern;Decreased stride length;Antalgic;Trunk flexed;Step-through pattern    Exercises     PT Diagnosis:    PT Problem List:   PT Treatment Interventions:     PT Goals (current goals can now be found in the care plan section)    Visit Information  Last PT Received On: 06/22/13 Assistance Needed: +2 (safety) History of Present Illness: 78 yo female s/p R hip hemiarthroplasty 11/19. Hx of chronic LBP (back surgery), anemia, anxiety.     Subjective Data      Cognition  Cognition Arousal/Alertness: Awake/alert Behavior During Therapy: WFL for tasks assessed/performed Area of Impairment: Memory;Safety/judgement Memory: Decreased short-term memory;Decreased recall of precautions Safety/Judgement: Decreased awareness of safety;Decreased awareness of deficits General Comments: pt requires constant muliti-modal cues to adhere to hip precautions,     Balance     End of Session PT - End of Session Activity Tolerance: Patient tolerated treatment well Patient left: in chair;with call bell/phone within reach;with chair alarm set   GP     Rebeca Alert, MPT Pager: [phone]

## 2013-06-22 NOTE — Progress Notes (Signed)
Clinical Social Work  Per chart review, patient reconsidering SNF placement. CSW reviewed chart and met with patient at bedside. CSW introduced myself and explained role. Patient reports that she lives with husband and dtr Hilda Lias) lives across the street. Patient has another dtr Silvio Pate) that lives in town as well.   CSW and patient discussed DC plans and spoke about HH vs SNF placement. Patient reports that Silvio Pate wants her to go to SNF but that she, husband, and Hilda Lias want to return home with [facility]. Patient spoke about missing her husband and wanting to visit her dog. Patient spoke about her emotional wellbeing and feels she will rehab better being around family. CSW spoke with both dtrs and husband via phone. Husband and Hilda Lias are happy that patient will return home and will provide transportation today. Silvio Pate is upset about patient returning home and feels husband will not be able to care for patient. Dtr is aware that patient is able to make her own decision regarding DC plans.  CSW and patient discussed HH SW in case placement is needed and patient agreeable. CSW made MD and CM aware of DC plans and HH needs.  CSW is signing off but available if further needs arise.  [place], [place] [phone]

## 2013-06-22 NOTE — Progress Notes (Signed)
Discharge instructions explained to pt and daughter. Daughter had concerns and questions about 2 medications. Dr. Rito Ehrlich called and it was clarified. Pt discharged to home with daughter and husband to care for her.

## 2013-06-23 IMAGING — CR DG CHEST 2V
2 series · 2 of 2 positions shown · non-contrast
Comparison: 03/22/2009 portable chest.

CLINICAL DATA: Preop respiratory examination for lumbar
laminectomy.

CHEST - 2 VIEW

[view not recorded (1 of 2)]
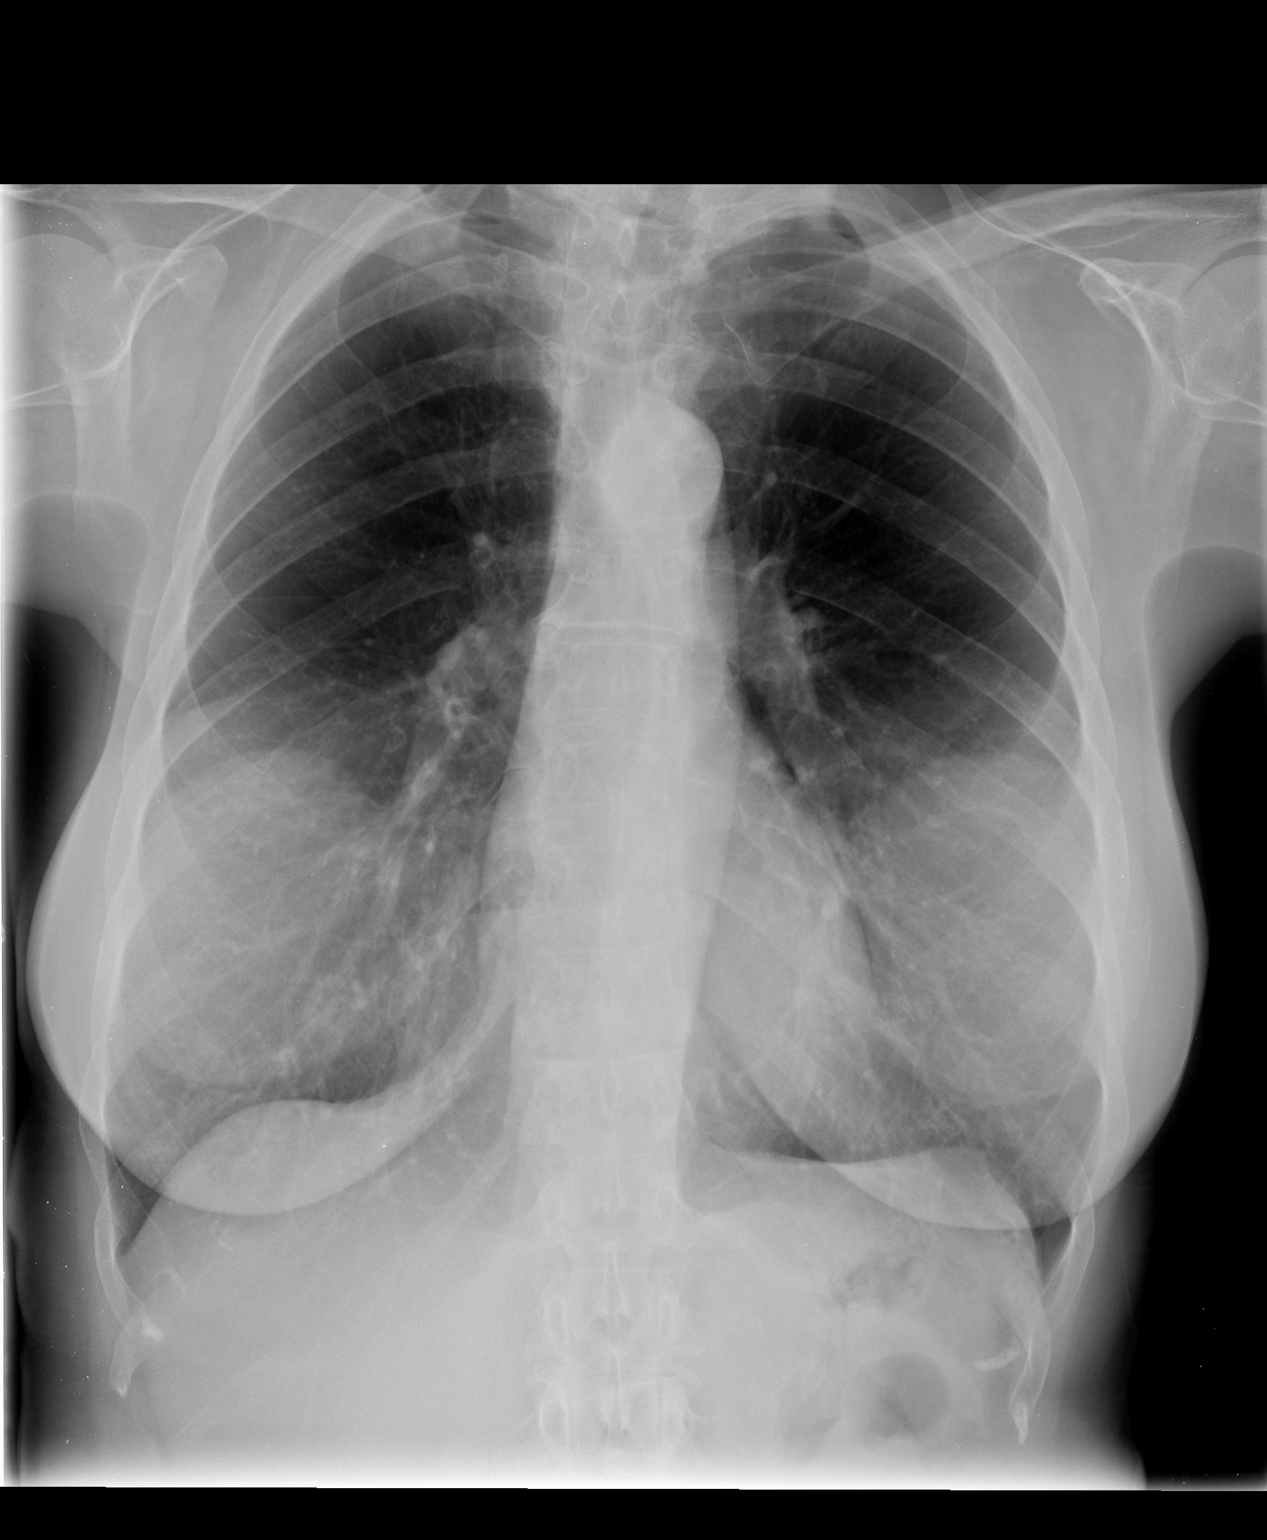

[view not recorded (2 of 2)]
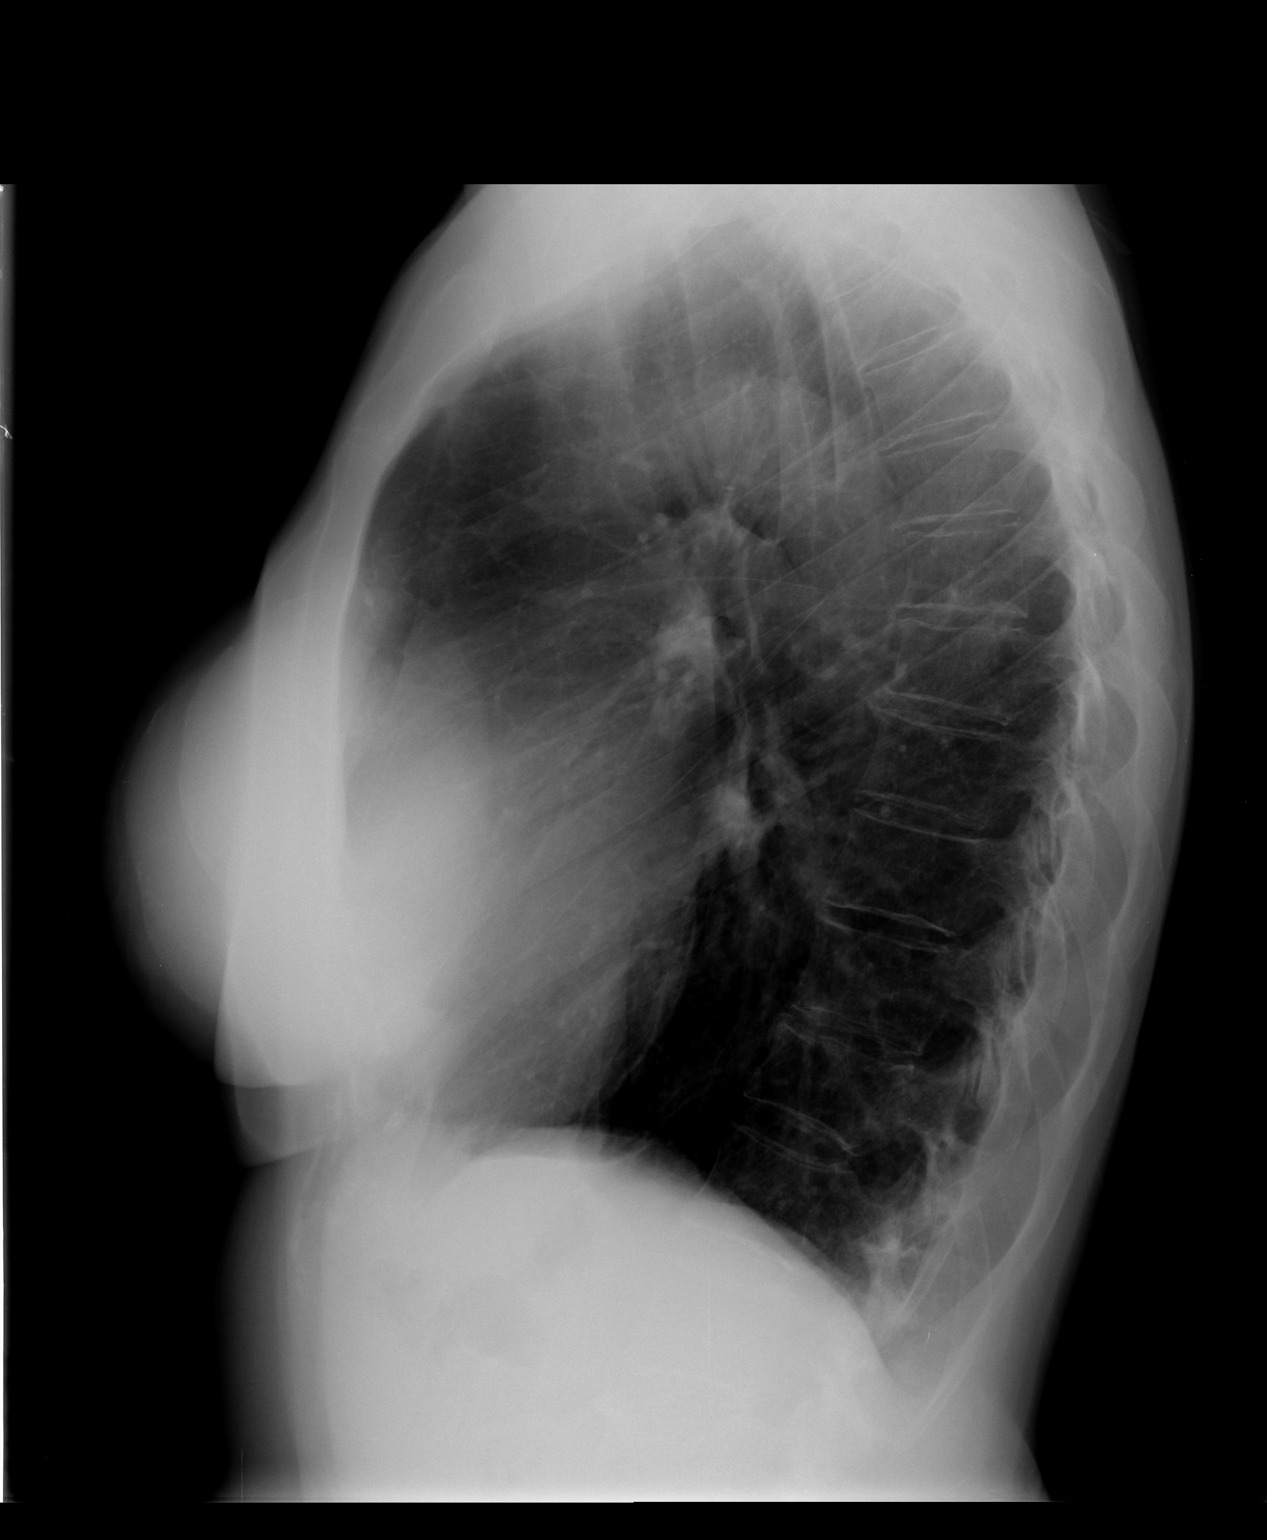

[2 of 2 positions shown; findings below may reference images not displayed]

FINDINGS: The heart size and mediastinal contours are stable.  The
lungs are clear.  There is no pleural effusion or pneumothorax.
Bilateral breast implants are noted.  Mild lower thoracic
compression deformities are chronic and unchanged.
IMPRESSION: Stable examination.  No active cardiopulmonary process.

## 2013-07-27 ENCOUNTER — Ambulatory Visit: Payer: Medicare Other | Attending: Orthopaedic Surgery | Admitting: Physical Therapy

## 2013-07-27 DIAGNOSIS — R5381 Other malaise: Secondary | ICD-10-CM | POA: Insufficient documentation

## 2013-07-27 DIAGNOSIS — IMO0001 Reserved for inherently not codable concepts without codable children: Secondary | ICD-10-CM | POA: Insufficient documentation

## 2013-07-27 DIAGNOSIS — Z96659 Presence of unspecified artificial knee joint: Secondary | ICD-10-CM | POA: Insufficient documentation

## 2013-07-27 DIAGNOSIS — M25559 Pain in unspecified hip: Secondary | ICD-10-CM | POA: Insufficient documentation

## 2013-07-27 DIAGNOSIS — R269 Unspecified abnormalities of gait and mobility: Secondary | ICD-10-CM | POA: Insufficient documentation

## 2013-07-30 ENCOUNTER — Ambulatory Visit: Payer: Medicare Other | Admitting: Physical Therapy

## 2013-08-04 ENCOUNTER — Ambulatory Visit: Payer: Medicare Other | Admitting: *Deleted

## 2013-08-06 ENCOUNTER — Ambulatory Visit: Payer: Medicare Other | Admitting: *Deleted

## 2013-08-11 ENCOUNTER — Ambulatory Visit: Payer: Medicare Other | Admitting: Physical Therapy

## 2013-08-13 ENCOUNTER — Ambulatory Visit: Payer: Medicare Other | Admitting: Physical Therapy

## 2013-08-18 ENCOUNTER — Ambulatory Visit: Payer: Medicare Other | Admitting: *Deleted

## 2013-08-20 ENCOUNTER — Ambulatory Visit: Payer: Medicare Other | Admitting: Physical Therapy

## 2013-08-25 ENCOUNTER — Ambulatory Visit: Payer: Medicare Other | Attending: Orthopaedic Surgery | Admitting: *Deleted

## 2013-08-25 DIAGNOSIS — R5381 Other malaise: Secondary | ICD-10-CM | POA: Insufficient documentation

## 2013-08-25 DIAGNOSIS — R269 Unspecified abnormalities of gait and mobility: Secondary | ICD-10-CM | POA: Insufficient documentation

## 2013-08-25 DIAGNOSIS — M25559 Pain in unspecified hip: Secondary | ICD-10-CM | POA: Insufficient documentation

## 2013-08-25 DIAGNOSIS — IMO0001 Reserved for inherently not codable concepts without codable children: Secondary | ICD-10-CM | POA: Insufficient documentation

## 2013-08-25 DIAGNOSIS — Z96659 Presence of unspecified artificial knee joint: Secondary | ICD-10-CM | POA: Insufficient documentation

## 2013-08-27 ENCOUNTER — Ambulatory Visit: Payer: Medicare Other | Admitting: *Deleted

## 2013-09-01 ENCOUNTER — Ambulatory Visit: Payer: Medicare Other | Admitting: Physical Therapy

## 2013-09-03 ENCOUNTER — Ambulatory Visit: Payer: Medicare Other | Admitting: *Deleted

## 2013-09-08 ENCOUNTER — Encounter: Payer: Medicare Other | Admitting: Physical Therapy

## 2013-09-10 ENCOUNTER — Encounter: Payer: Medicare Other | Admitting: *Deleted

## 2013-09-15 ENCOUNTER — Encounter: Payer: Medicare Other | Admitting: *Deleted

## 2013-09-17 ENCOUNTER — Encounter: Payer: Medicare Other | Admitting: *Deleted

## 2013-10-20 ENCOUNTER — Other Ambulatory Visit: Payer: Self-pay | Admitting: Neurosurgery

## 2013-10-23 ENCOUNTER — Encounter (HOSPITAL_COMMUNITY): Payer: Self-pay | Admitting: Pharmacy Technician

## 2013-10-30 ENCOUNTER — Encounter (HOSPITAL_COMMUNITY)
Admission: RE | Admit: 2013-10-30 | Discharge: 2013-10-30 | Disposition: A | Discharge status: Home / Self Care | Payer: Medicare Other | Source: Ambulatory Visit | Attending: Neurosurgery | Admitting: Neurosurgery

## 2013-10-30 ENCOUNTER — Encounter (HOSPITAL_COMMUNITY): Payer: Self-pay

## 2013-10-30 DIAGNOSIS — Z01812 Encounter for preprocedural laboratory examination: Secondary | ICD-10-CM | POA: Insufficient documentation

## 2013-10-30 HISTORY — DX: Essential (primary) hypertension: I10

## 2013-10-30 HISTORY — DX: Repeated falls: R29.6

## 2013-10-30 LAB — CBC
HCT: 30.3 % — ABNORMAL LOW (ref 36.0–46.0)
HEMOGLOBIN: 10.4 g/dL — AB (ref 12.0–15.0)
MCH: 31 pg (ref 26.0–34.0)
MCHC: 34.3 g/dL (ref 30.0–36.0)
MCV: 90.4 fL (ref 78.0–100.0)
PLATELETS: 266 10*3/uL (ref 150–400)
RBC: 3.35 MIL/uL — AB (ref 3.87–5.11)
RDW: 12.6 % (ref 11.5–15.5)
WBC: 6.2 10*3/uL (ref 4.0–10.5)

## 2013-10-30 LAB — BASIC METABOLIC PANEL
BUN: 10 mg/dL (ref 6–23)
CO2: 27 meq/L (ref 19–32)
Calcium: 9 mg/dL (ref 8.4–10.5)
Chloride: 98 mEq/L (ref 96–112)
Creatinine, Ser: 0.76 mg/dL (ref 0.50–1.10)
GFR calc Af Amer: >90 mL/min (ref >90)
GFR calc non Af Amer: 80 mL/min — ABNORMAL LOW (ref >90)
Glucose, Bld: 80 mg/dL (ref 70–99)
Potassium: 3.9 mEq/L (ref 3.7–5.3)
SODIUM: 138 meq/L (ref 137–147)

## 2013-10-30 NOTE — Pre-Procedure Instructions (Signed)
Charise Leinbach  10/30/2013   Your procedure is scheduled on:  11/05/13  Report to [facility]  2 * 3 at 1245 pm  Call this number if you have problems the morning of surgery: [phone]   Remember:   Do not eat food or drink liquids after midnight.   Take these medicines the morning of surgery with A SIP OF WATER: xanax,carvedilol,lexapro,prilosec   Do not wear jewelry, make-up or nail polish.  Do not wear lotions, powders, or perfumes. You may wear deodorant.  Do not shave 48 hours prior to surgery. Men may shave face and neck.  Do not bring valuables to the hospital.  [facility] is not responsible                  for any belongings or valuables.               Contacts, dentures or bridgework may not be worn into surgery.  Leave suitcase in the car. After surgery it may be brought to your room.  For patients admitted to the hospital, discharge time is determined by your                treatment team.               Patients discharged the day of surgery will not be allowed to drive  home.  Name and phone number of your driver: family  Special Instructions: Shower using CHG 2 nights before surgery and the night before surgery.  If you shower the day of surgery use CHG.  Use special wash - you have one bottle of CHG for all showers.  You should use approximately 1/3 of the bottle for each shower.   Please read over the following fact sheets that you were given: Pain Booklet, Coughing and Deep Breathing and Surgical Site Infection Prevention

## 2013-10-30 NOTE — Progress Notes (Signed)
1 view was reviewed by Ebony Hail PA, and ok'ed.

## 2013-11-04 MED ORDER — CEFAZOLIN SODIUM-DEXTROSE 2-3 GM-% IV SOLR
2.0000 g | INTRAVENOUS | Status: AC
  Administered 2013-11-05: 2 g via INTRAVENOUS
  Filled 2013-11-04: qty 50

## 2013-11-05 ENCOUNTER — Ambulatory Visit (HOSPITAL_COMMUNITY): Payer: Medicare Other | Admitting: Certified Registered Nurse Anesthetist

## 2013-11-05 ENCOUNTER — Encounter (HOSPITAL_COMMUNITY)
Admission: RE | Disposition: A | Discharge status: Home / Self Care | Payer: Self-pay | Source: Ambulatory Visit | Attending: Neurosurgery

## 2013-11-05 ENCOUNTER — Encounter (HOSPITAL_COMMUNITY): Payer: Self-pay | Admitting: *Deleted

## 2013-11-05 ENCOUNTER — Observation Stay (HOSPITAL_COMMUNITY)
Admission: RE | Admit: 2013-11-05 | Discharge: 2013-11-06 | Disposition: A | Discharge status: Home / Self Care | Payer: Medicare Other | Source: Ambulatory Visit | Attending: Neurosurgery | Admitting: Neurosurgery

## 2013-11-05 ENCOUNTER — Ambulatory Visit (HOSPITAL_COMMUNITY): Payer: Medicare Other

## 2013-11-05 ENCOUNTER — Encounter (HOSPITAL_COMMUNITY): Payer: Medicare Other | Admitting: Vascular Surgery

## 2013-11-05 DIAGNOSIS — I5181 Takotsubo syndrome: Secondary | ICD-10-CM | POA: Insufficient documentation

## 2013-11-05 DIAGNOSIS — M1710 Unilateral primary osteoarthritis, unspecified knee: Secondary | ICD-10-CM | POA: Insufficient documentation

## 2013-11-05 DIAGNOSIS — Z9181 History of falling: Secondary | ICD-10-CM | POA: Insufficient documentation

## 2013-11-05 DIAGNOSIS — I2510 Atherosclerotic heart disease of native coronary artery without angina pectoris: Secondary | ICD-10-CM | POA: Insufficient documentation

## 2013-11-05 DIAGNOSIS — R51 Headache: Secondary | ICD-10-CM | POA: Insufficient documentation

## 2013-11-05 DIAGNOSIS — K589 Irritable bowel syndrome without diarrhea: Secondary | ICD-10-CM | POA: Insufficient documentation

## 2013-11-05 DIAGNOSIS — I10 Essential (primary) hypertension: Secondary | ICD-10-CM | POA: Insufficient documentation

## 2013-11-05 DIAGNOSIS — F329 Major depressive disorder, single episode, unspecified: Secondary | ICD-10-CM | POA: Insufficient documentation

## 2013-11-05 DIAGNOSIS — K219 Gastro-esophageal reflux disease without esophagitis: Secondary | ICD-10-CM | POA: Insufficient documentation

## 2013-11-05 DIAGNOSIS — F3289 Other specified depressive episodes: Secondary | ICD-10-CM | POA: Insufficient documentation

## 2013-11-05 DIAGNOSIS — E785 Hyperlipidemia, unspecified: Secondary | ICD-10-CM | POA: Insufficient documentation

## 2013-11-05 DIAGNOSIS — F411 Generalized anxiety disorder: Secondary | ICD-10-CM | POA: Insufficient documentation

## 2013-11-05 DIAGNOSIS — Z96649 Presence of unspecified artificial hip joint: Secondary | ICD-10-CM | POA: Insufficient documentation

## 2013-11-05 DIAGNOSIS — K5730 Diverticulosis of large intestine without perforation or abscess without bleeding: Secondary | ICD-10-CM | POA: Insufficient documentation

## 2013-11-05 DIAGNOSIS — S32000A Wedge compression fracture of unspecified lumbar vertebra, initial encounter for closed fracture: Secondary | ICD-10-CM | POA: Diagnosis present

## 2013-11-05 DIAGNOSIS — D649 Anemia, unspecified: Secondary | ICD-10-CM | POA: Insufficient documentation

## 2013-11-05 DIAGNOSIS — M8448XA Pathological fracture, other site, initial encounter for fracture: Principal | ICD-10-CM | POA: Insufficient documentation

## 2013-11-05 DIAGNOSIS — IMO0002 Reserved for concepts with insufficient information to code with codable children: Secondary | ICD-10-CM | POA: Insufficient documentation

## 2013-11-05 HISTORY — PX: KYPHOPLASTY: SHX5884

## 2013-11-05 SURGERY — KYPHOPLASTY
Anesthesia: General | Site: Spine Lumbar

## 2013-11-05 MED ORDER — MENTHOL 3 MG MT LOZG: 1.0000 | LOZENGE | OROMUCOSAL | Status: DC | PRN

## 2013-11-05 MED ORDER — DICYCLOMINE HCL 20 MG PO TABS
20.0000 mg | ORAL_TABLET | Freq: Three times a day (TID) | ORAL | Status: DC
  Administered 2013-11-05 (×2): 20 mg via ORAL
  Filled 2013-11-05 (×5): qty 1

## 2013-11-05 MED ORDER — LACTATED RINGERS IV SOLN
INTRAVENOUS | Status: DC
  Administered 2013-11-05: 13:00:00 via INTRAVENOUS

## 2013-11-05 MED ORDER — PANTOPRAZOLE SODIUM 40 MG PO TBEC
40.0000 mg | DELAYED_RELEASE_TABLET | Freq: Every day | ORAL | Status: DC
  Administered 2013-11-05: 40 mg via ORAL
  Filled 2013-11-05: qty 1

## 2013-11-05 MED ORDER — PHENYLEPHRINE 40 MCG/ML (10ML) SYRINGE FOR IV PUSH (FOR BLOOD PRESSURE SUPPORT)
PREFILLED_SYRINGE | INTRAVENOUS | Status: AC
  Filled 2013-11-05: qty 10

## 2013-11-05 MED ORDER — DOCUSATE SODIUM 100 MG PO CAPS
100.0000 mg | ORAL_CAPSULE | Freq: Two times a day (BID) | ORAL | Status: DC
  Filled 2013-11-05 (×3): qty 1

## 2013-11-05 MED ORDER — LIDOCAINE HCL (CARDIAC) 20 MG/ML IV SOLN
INTRAVENOUS | Status: AC
Start: 2013-11-05 — End: 2013-11-05
  Filled 2013-11-05: qty 5

## 2013-11-05 MED ORDER — CEFAZOLIN SODIUM-DEXTROSE 2-3 GM-% IV SOLR
2.0000 g | Freq: Three times a day (TID) | INTRAVENOUS | Status: AC
  Administered 2013-11-05 – 2013-11-06 (×2): 2 g via INTRAVENOUS
  Filled 2013-11-05 (×2): qty 50

## 2013-11-05 MED ORDER — FENTANYL CITRATE 0.05 MG/ML IJ SOLN
INTRAMUSCULAR | Status: AC
  Filled 2013-11-05: qty 5

## 2013-11-05 MED ORDER — ROCURONIUM BROMIDE 50 MG/5ML IV SOLN
INTRAVENOUS | Status: AC
  Filled 2013-11-05: qty 1

## 2013-11-05 MED ORDER — TRAMADOL HCL 50 MG PO TABS
50.0000 mg | ORAL_TABLET | Freq: Four times a day (QID) | ORAL | Status: DC | PRN
  Administered 2013-11-05: 50 mg via ORAL
  Filled 2013-11-05: qty 1

## 2013-11-05 MED ORDER — LIDOCAINE HCL (CARDIAC) 20 MG/ML IV SOLN
INTRAVENOUS | Status: AC
  Filled 2013-11-05: qty 5

## 2013-11-05 MED ORDER — ACETAMINOPHEN 325 MG PO TABS
650.0000 mg | ORAL_TABLET | ORAL | Status: DC | PRN
Start: 2013-11-05 — End: 2013-11-06

## 2013-11-05 MED ORDER — CARVEDILOL 6.25 MG PO TABS
6.2500 mg | ORAL_TABLET | Freq: Two times a day (BID) | ORAL | Status: DC
  Administered 2013-11-05 – 2013-11-06 (×2): 6.25 mg via ORAL
  Filled 2013-11-05 (×4): qty 1

## 2013-11-05 MED ORDER — ONDANSETRON HCL 4 MG/2ML IJ SOLN
INTRAMUSCULAR | Status: AC
  Filled 2013-11-05: qty 2

## 2013-11-05 MED ORDER — LACTATED RINGERS IV SOLN: INTRAVENOUS | Status: DC

## 2013-11-05 MED ORDER — OXYCODONE HCL 5 MG PO TABS: 5.0000 mg | ORAL_TABLET | Freq: Once | ORAL | Status: DC | PRN

## 2013-11-05 MED ORDER — HYDROCODONE-ACETAMINOPHEN 5-325 MG PO TABS
1.0000 | ORAL_TABLET | ORAL | Status: DC | PRN
  Administered 2013-11-05 – 2013-11-06 (×2): 2 via ORAL
  Filled 2013-11-05 (×2): qty 2

## 2013-11-05 MED ORDER — ACETAMINOPHEN 650 MG RE SUPP: 650.0000 mg | RECTAL | Status: DC | PRN

## 2013-11-05 MED ORDER — IOHEXOL 300 MG/ML  SOLN
INTRAMUSCULAR | Status: DC | PRN
  Administered 2013-11-05: 300 mg

## 2013-11-05 MED ORDER — FENTANYL CITRATE 0.05 MG/ML IJ SOLN: 25.0000 ug | INTRAMUSCULAR | Status: DC | PRN

## 2013-11-05 MED ORDER — OXYCODONE-ACETAMINOPHEN 5-325 MG PO TABS: 1.0000 | ORAL_TABLET | ORAL | Status: DC | PRN

## 2013-11-05 MED ORDER — PHENYLEPHRINE HCL 10 MG/ML IJ SOLN
INTRAMUSCULAR | Status: DC | PRN
  Administered 2013-11-05: 40 ug via INTRAVENOUS

## 2013-11-05 MED ORDER — ONDANSETRON HCL 4 MG/2ML IJ SOLN: 4.0000 mg | INTRAMUSCULAR | Status: DC | PRN

## 2013-11-05 MED ORDER — CARVEDILOL 12.5 MG PO TABS
ORAL_TABLET | ORAL | Status: AC
  Administered 2013-11-05: 6.25 mg via ORAL
  Filled 2013-11-05: qty 1

## 2013-11-05 MED ORDER — LIDOCAINE HCL (CARDIAC) 20 MG/ML IV SOLN
INTRAVENOUS | Status: DC | PRN
  Administered 2013-11-05: 20 mg via INTRAVENOUS

## 2013-11-05 MED ORDER — MORPHINE SULFATE 2 MG/ML IJ SOLN: 1.0000 mg | INTRAMUSCULAR | Status: DC | PRN

## 2013-11-05 MED ORDER — SUCCINYLCHOLINE CHLORIDE 20 MG/ML IJ SOLN
INTRAMUSCULAR | Status: DC | PRN
  Administered 2013-11-05: 80 mg via INTRAVENOUS

## 2013-11-05 MED ORDER — PROMETHAZINE HCL 25 MG/ML IJ SOLN: 6.2500 mg | INTRAMUSCULAR | Status: DC | PRN

## 2013-11-05 MED ORDER — ESCITALOPRAM OXALATE 20 MG PO TABS
20.0000 mg | ORAL_TABLET | Freq: Every day | ORAL | Status: DC
  Administered 2013-11-05: 20 mg via ORAL
  Filled 2013-11-05 (×2): qty 1

## 2013-11-05 MED ORDER — DIAZEPAM 5 MG PO TABS: 5.0000 mg | ORAL_TABLET | Freq: Four times a day (QID) | ORAL | Status: DC | PRN

## 2013-11-05 MED ORDER — NITROGLYCERIN 0.4 MG SL SUBL: 0.4000 mg | SUBLINGUAL_TABLET | SUBLINGUAL | Status: DC | PRN

## 2013-11-05 MED ORDER — MEPERIDINE HCL 25 MG/ML IJ SOLN: 6.2500 mg | INTRAMUSCULAR | Status: DC | PRN

## 2013-11-05 MED ORDER — PROPOFOL 10 MG/ML IV BOLUS
INTRAVENOUS | Status: DC | PRN
  Administered 2013-11-05: 50 mg via INTRAVENOUS
  Administered 2013-11-05: 20 mg via INTRAVENOUS

## 2013-11-05 MED ORDER — ALUM & MAG HYDROXIDE-SIMETH 200-200-20 MG/5ML PO SUSP: 30.0000 mL | Freq: Four times a day (QID) | ORAL | Status: DC | PRN

## 2013-11-05 MED ORDER — PHENOL 1.4 % MT LIQD: 1.0000 | OROMUCOSAL | Status: DC | PRN

## 2013-11-05 MED ORDER — DROPERIDOL 2.5 MG/ML IJ SOLN
0.6250 mg | INTRAMUSCULAR | Status: DC | PRN
  Filled 2013-11-05: qty 0.25

## 2013-11-05 MED ORDER — ADULT MULTIVITAMIN W/MINERALS CH
1.0000 | ORAL_TABLET | Freq: Every day | ORAL | Status: DC
  Administered 2013-11-05: 1 via ORAL
  Filled 2013-11-05 (×2): qty 1

## 2013-11-05 MED ORDER — 0.9 % SODIUM CHLORIDE (POUR BTL) OPTIME
TOPICAL | Status: DC | PRN
  Administered 2013-11-05: 1000 mL

## 2013-11-05 MED ORDER — BACITRACIN ZINC 500 UNIT/GM EX OINT
TOPICAL_OINTMENT | CUTANEOUS | Status: DC | PRN
  Administered 2013-11-05: 1 via TOPICAL

## 2013-11-05 MED ORDER — BUPIVACAINE-EPINEPHRINE 0.5% -1:200000 IJ SOLN
INTRAMUSCULAR | Status: DC | PRN
  Administered 2013-11-05: 10 mL

## 2013-11-05 MED ORDER — LISINOPRIL 5 MG PO TABS
5.0000 mg | ORAL_TABLET | Freq: Every day | ORAL | Status: DC
  Administered 2013-11-05: 5 mg via ORAL
  Filled 2013-11-05 (×2): qty 1

## 2013-11-05 MED ORDER — FENTANYL CITRATE 0.05 MG/ML IJ SOLN
INTRAMUSCULAR | Status: DC | PRN
  Administered 2013-11-05 (×2): 50 ug via INTRAVENOUS

## 2013-11-05 MED ORDER — FENTANYL CITRATE 0.05 MG/ML IJ SOLN
25.0000 ug | INTRAMUSCULAR | Status: DC | PRN
  Administered 2013-11-05 (×2): 25 ug via INTRAVENOUS

## 2013-11-05 MED ORDER — FENTANYL CITRATE 0.05 MG/ML IJ SOLN
INTRAMUSCULAR | Status: AC
  Filled 2013-11-05: qty 2

## 2013-11-05 MED ORDER — ONDANSETRON HCL 4 MG/2ML IJ SOLN
INTRAMUSCULAR | Status: DC | PRN
Start: 2013-11-05 — End: 2013-11-05
  Administered 2013-11-05: 4 mg via INTRAVENOUS

## 2013-11-05 MED ORDER — OXYCODONE HCL 5 MG/5ML PO SOLN: 5.0000 mg | Freq: Once | ORAL | Status: DC | PRN

## 2013-11-05 SURGICAL SUPPLY — 41 items
BENZOIN TINCTURE PRP APPL 2/3 (GAUZE/BANDAGES/DRESSINGS) ×3 IMPLANT
BLADE SURG 15 STRL LF DISP TIS (BLADE) ×1 IMPLANT
BLADE SURG 15 STRL SS (BLADE) ×2
BLADE SURG ROTATE 9660 (MISCELLANEOUS) IMPLANT
CEMENT BONE KYPHX HV R (Orthopedic Implant) ×3 IMPLANT
CEMENT KYPHON C01A KIT/MIXER (Cement) ×3 IMPLANT
CLOSURE WOUND 1/2 X4 (GAUZE/BANDAGES/DRESSINGS) ×1
CONT SPEC 4OZ CLIKSEAL STRL BL (MISCELLANEOUS) ×3 IMPLANT
DRAPE C-ARM 42X72 X-RAY (DRAPES) ×3 IMPLANT
DRAPE INCISE IOBAN 66X45 STRL (DRAPES) ×3 IMPLANT
DRAPE LAPAROTOMY 100X72X124 (DRAPES) ×3 IMPLANT
DRAPE PROXIMA HALF (DRAPES) ×3 IMPLANT
DRAPE SURG 17X23 STRL (DRAPES) ×12 IMPLANT
GAUZE SPONGE 4X4 16PLY XRAY LF (GAUZE/BANDAGES/DRESSINGS) ×3 IMPLANT
GLOVE BIO SURGEON STRL SZ8.5 (GLOVE) ×3 IMPLANT
GLOVE BIOGEL PI IND STRL 7.5 (GLOVE) ×1 IMPLANT
GLOVE BIOGEL PI INDICATOR 7.5 (GLOVE) ×2
GLOVE EXAM NITRILE LRG STRL (GLOVE) IMPLANT
GLOVE EXAM NITRILE MD LF STRL (GLOVE) IMPLANT
GLOVE EXAM NITRILE XL STR (GLOVE) IMPLANT
GLOVE EXAM NITRILE XS STR PU (GLOVE) IMPLANT
GLOVE SS BIOGEL STRL SZ 8 (GLOVE) ×1 IMPLANT
GLOVE SUPERSENSE BIOGEL SZ 8 (GLOVE) ×2
GLOVE SURG SS PI 7.0 STRL IVOR (GLOVE) ×3 IMPLANT
GOWN BRE IMP SLV AUR LG STRL (GOWN DISPOSABLE) ×3 IMPLANT
GOWN BRE IMP SLV AUR XL STRL (GOWN DISPOSABLE) ×3 IMPLANT
KIT BASIN OR (CUSTOM PROCEDURE TRAY) ×3 IMPLANT
KIT ROOM TURNOVER OR (KITS) ×3 IMPLANT
NEEDLE HYPO 22GX1.5 SAFETY (NEEDLE) ×3 IMPLANT
NS IRRIG 1000ML POUR BTL (IV SOLUTION) ×3 IMPLANT
PACK EENT II TURBAN DRAPE (CUSTOM PROCEDURE TRAY) ×3 IMPLANT
PAD ARMBOARD 7.5X6 YLW CONV (MISCELLANEOUS) ×15 IMPLANT
SPECIMEN JAR SMALL (MISCELLANEOUS) ×3 IMPLANT
SPONGE GAUZE 4X4 12PLY (GAUZE/BANDAGES/DRESSINGS) ×3 IMPLANT
STRIP CLOSURE SKIN 1/2X4 (GAUZE/BANDAGES/DRESSINGS) ×2 IMPLANT
SUT VIC AB 3-0 SH 8-18 (SUTURE) ×3 IMPLANT
SYR CONTROL 10ML LL (SYRINGE) ×3 IMPLANT
TAPE CLOTH SURG 4X10 WHT LF (GAUZE/BANDAGES/DRESSINGS) ×3 IMPLANT
TOWEL OR 17X24 6PK STRL BLUE (TOWEL DISPOSABLE) ×3 IMPLANT
TOWEL OR 17X26 10 PK STRL BLUE (TOWEL DISPOSABLE) ×3 IMPLANT
TRAY KYPHOPAK 20/3 ONESTEP 1ST (MISCELLANEOUS) ×3 IMPLANT

## 2013-11-05 NOTE — Anesthesia Procedure Notes (Signed)
Procedure Name: Intubation Date/Time: 11/05/2013 1:34 PM Performed by: Carola Frost Pre-anesthesia Checklist: Patient identified, Timeout performed, Emergency Drugs available, Suction available and Patient being monitored Patient Re-evaluated:Patient Re-evaluated prior to inductionOxygen Delivery Method: Circle system utilized Preoxygenation: Pre-oxygenation with 100% oxygen Intubation Type: IV induction Ventilation: Mask ventilation without difficulty Laryngoscope Size: Mac and 3 Grade View: Grade I Tube type: Oral Tube size: 7.0 mm Number of attempts: 1 Airway Equipment and Method: Stylet Placement Confirmation: CO2 detector,  positive ETCO2,  ETT inserted through vocal cords under direct vision and breath sounds checked- equal and bilateral Secured at: 21 cm Tube secured with: Tape Dental Injury: Teeth and Oropharynx as per pre-operative assessment

## 2013-11-05 NOTE — Op Note (Signed)
Brief history: The patient is a 79 year old white female who fell in November of 2014 suffering a hip fracture and an L2 compression fracture. She has history of an old L1 and L3 compression fracture. She has failed medical management of her compression fracture. We discussed the various treatment options including surgery. She has weighed the risks, benefits, and alternatives surgery and decided proceed with a L2 kyphoplasty.  Preop diagnosis: L2 compression fracture, lumbago  Postop diagnosis: The same  Procedure: L2 kyphoplasty  Surgeon: Dr. Earle Gell  Assistant: None  Anesthesia: Gen. endotracheal  Specimens: None  Drains: None  Complications: None  Description of procedure: The patient was brought to the operating room by the anesthesia team. General endotracheal anesthesia was induced. The patient was turned to the prone position on the chest rolls. Her lumbosacral region was then prepared with Betadine scrub and Betadine solution. Sterile drapes were applied. I then injected the area to be incised with Marcaine with epinephrine solution. Under biplanar fluoroscopic guidance it may incision over the bilateral L2 pedicles. I then cannulated the pedicles with the Jamshidi needle. I then drilled inside the pedicles into the vertebral body. We inserted a balloon at L2 bilaterally. I then inflated and deflated the balloons. I then injected bone cement into the vertebral bodies bilaterally under fluoroscopic guidance. After were satisfied with the injection I removed the cannulas. I then reapproximated the patient's subcutaneous tissue with interrupted 2-0 Vicryl suture. I then reapproximated the skin with Steri-Strips and benzoin. The wounds were then coated with bacitracin ointment. A sterile dressing was applied. The drapes were removed. The patient was subsequently returned to the supine position. She was then extubated by the anesthesia team and transported to the post anesthesia care  unit in stable condition. By report all sponge, instrument, and needle counts were correct at the end this case.

## 2013-11-05 NOTE — Progress Notes (Signed)
Subjective:  The patient is somnolent but easily arousable. She is in no apparent distress. She looks well.  Objective: Vital signs in last 24 hours: Temp:  [97.7 F (36.5 C)] 97.7 F (36.5 C) (04/16 1443) Pulse Rate:  [65] 65 (04/16 1233) Resp:  [18] 18 (04/16 1233) BP: (133)/(59) 133/59 mmHg (04/16 1233) SpO2:  [99 %] 99 % (04/16 1233) Weight:  [50.803 kg (112 lb)] 50.803 kg (112 lb) (04/16 1233)  Intake/Output from previous day:   Intake/Output this shift: Total I/O In: 700 [I.V.:700] Out: 10 [Blood:10]  Physical exam patient is somewhat but easily arousable. She is moving her lower extremities well.  Lab Results: No results found for this basename: WBC, HGB, HCT, PLT,  in the last 72 hours BMET No results found for this basename: NA, K, CL, CO2, GLUCOSE, BUN, CREATININE, CALCIUM,  in the last 72 hours  Studies/Results: No results found.  Assessment/Plan: The patient is doing well. I spoke with her husband. We will plan to keep her overnight.  LOS: 0 days     Ophelia Charter 11/05/2013, 2:52 PM

## 2013-11-05 NOTE — Anesthesia Preprocedure Evaluation (Addendum)
Anesthesia Evaluation  Patient identified by MRN, date of birth, ID band Patient awake    Reviewed: Allergy & Precautions, H&P , NPO status , Patient's Chart, lab work & pertinent test results, reviewed documented beta blocker date and time   History of Anesthesia Complications Negative for: history of anesthetic complications  Airway Mallampati: I TM Distance: >3 FB Neck ROM: Full    Dental  (+) Teeth Intact, Caps, Implants, Dental Advisory Given   Pulmonary neg pulmonary ROS,  breath sounds clear to auscultation  Pulmonary exam normal       Cardiovascular hypertension, Pt. on medications and Pt. on home beta blockers - angina+ CAD (mild LAD disease by cath '07) Rhythm:Regular Rate:Normal  '12 Stress myoview: normal perfusion, no ischemia, EF 82%   Neuro/Psych Recurrent falls Short term memory deficit    GI/Hepatic Neg liver ROS, GERD-  Medicated and Controlled,  Endo/Other  negative endocrine ROS  Renal/GU negative Renal ROS     Musculoskeletal   Abdominal   Peds  Hematology  (+) Blood dyscrasia (Hb 10.1), anemia ,   Anesthesia Other Findings   Reproductive/Obstetrics                      Anesthesia Physical Anesthesia Plan  ASA: III  Anesthesia Plan: General   Post-op Pain Management:    Induction: Intravenous  Airway Management Planned: Oral ETT  Additional Equipment:   Intra-op Plan:   Post-operative Plan: Extubation in OR  Informed Consent: I have reviewed the patients History and Physical, chart, labs and discussed the procedure including the risks, benefits and alternatives for the proposed anesthesia with the patient or authorized representative who has indicated his/her understanding and acceptance.   Dental advisory given  Plan Discussed with: CRNA and Surgeon  Anesthesia Plan Comments: (Plan routine monitors, GETA)        Anesthesia Quick Evaluation

## 2013-11-05 NOTE — Anesthesia Postprocedure Evaluation (Signed)
Anesthesia Post Note  Patient: [name]  Procedure(s) Performed: Procedure(s) (LRB): KYPHOPLASTY,LUMBAR TWO (N/A)  Anesthesia type: General  Patient location: PACU  Post pain: Pain level controlled and Adequate analgesia  Post assessment: Post-op Vital signs reviewed, Patient's Cardiovascular Status Stable, Respiratory Function Stable, Patent Airway and Pain level controlled  Last Vitals:  Filed Vitals:   11/05/13 1530  BP:   Pulse: 82  Temp:   Resp: 11    Post vital signs: Reviewed and stable  Level of consciousness: awake, alert  and oriented  Complications: No apparent anesthesia complications

## 2013-11-05 NOTE — H&P (Signed)
Subjective: The patient is a 79 year old white female who was then used to of health until November of 2014. At that time she took a fall suffering a hip an L2 compression fracture. She has failed medical management and was worked up with a lumbar x-rays and a lumbar MRI. This demonstrated an acute L2 compression fracture with old fractures at L1 and L5. I discussed the various treatment options with the patient including surgery. She has weighed the risks, benefits, and alternatives surgery decided proceed with a L2 kyphoplasty.   Past Medical History  Diagnosis Date  . Takotsubo syndrome   . Other and unspecified hyperlipidemia   . Esophageal reflux   . Osteoarthrosis, unspecified whether generalized or localized, lower leg   . IBS (irritable bowel syndrome)   . Diverticular disease   . Shortness of breath     when my heart is not doing right  . Anemia   . Depression   . Coronary artery disease   . Blood transfusion     2005 maybe  . Headache(784.0)     takes Prilosec  . Hypertension   . Falls frequently   . Anxiety     confusion    Past Surgical History  Procedure Laterality Date  . Knee surgery    . Hysterotomy    . Breast reconstruction    . Implantable contact lens implantation    . Cataract extraction w/ intraocular lens  implant, bilateral    . Tonsillectomy      as child  . Back surgery    . Hip arthroplasty Right 06/10/2013    Procedure: ARTHROPLASTY MONOPOLAR HIP CEMENTED;  Surgeon: Marybelle Killings, MD;  Location: [facility]S;  Service: Orthopedics;  Laterality: Right;  . Joint replacement    . Fracture surgery      Allergies  Allergen Reactions  . Sulfamethoxazole-Trimethoprim Hives    History  Substance Use Topics  . Smoking status: Never Smoker   . Smokeless tobacco: Never Used  . Alcohol Use: No    History reviewed. No pertinent family history. Prior to Admission medications   Medication Sig Start Date End Date Taking? Authorizing Provider  ALPRAZolam  Duanne Moron) 0.5 MG tablet Take 1 tablet (0.5 mg total) by mouth 3 (three) times daily as needed for anxiety or sleep. For anxiety. 06/22/13  Yes Annita Brod, MD  aspirin (BAYER ASPIRIN) 325 MG tablet Take 1 tablet (325 mg total) by mouth daily. 06/10/13  Yes Marybelle Killings, MD  carvedilol (COREG) 6.25 MG tablet Take 6.25 mg by mouth 2 (two) times daily with a meal.    Yes Historical Provider, MD  Cyanocobalamin 1000 MCG/ML LIQD Take 1 mL by mouth daily.   Yes Historical Provider, MD  dicyclomine (BENTYL) 20 MG tablet Take 20 mg by mouth 3 (three) times daily.   Yes Historical Provider, MD  escitalopram (LEXAPRO) 10 MG tablet Take 20 mg by mouth daily.   Yes Historical Provider, MD  lisinopril (PRINIVIL,ZESTRIL) 5 MG tablet Take 1 tablet (5 mg total) by mouth daily. 06/22/13  Yes Annita Brod, MD  Multiple Vitamins-Minerals (MULTIVITAMINS THER. W/MINERALS) TABS Take 1 tablet by mouth daily.    Yes Historical Provider, MD  nitroGLYCERIN (NITROSTAT) 0.4 MG SL tablet Place 1 tablet (0.4 mg total) under the tongue every 5 (five) minutes as needed. For chest pain. 04/22/13  Yes Sherren Mocha, MD  omeprazole (PRILOSEC) 20 MG capsule Take 20 mg by mouth daily.     Yes Historical Provider,  MD  phenylephrine-shark liver oil-mineral oil-petrolatum (PREPARATION H) 0.25-3-14-71.9 % rectal ointment Place 1 application rectally 2 (two) times daily as needed for hemorrhoids.   Yes Historical Provider, MD  traMADol (ULTRAM) 50 MG tablet Take 1 tablet (50 mg total) by mouth every 6 (six) hours as needed for moderate pain. 06/14/13  Yes Velvet Bathe, MD  trolamine salicylate (ASPERCREME) 10 % cream Apply 1 application topically as needed for muscle pain.   Yes Historical Provider, MD     Review of Systems  Positive ROS: As above  All other systems have been reviewed and were otherwise negative with the exception of those mentioned in the HPI and as above.  Objective: Vital signs in last 24 hours: Temp:  [97.7  F (36.5 C)] 97.7 F (36.5 C) (04/16 1233) Pulse Rate:  [65] 65 (04/16 1233) Resp:  [18] 18 (04/16 1233) BP: (133)/(59) 133/59 mmHg (04/16 1233) SpO2:  [99 %] 99 % (04/16 1233) Weight:  [50.803 kg (112 lb)] 50.803 kg (112 lb) (04/16 1233)  General Appearance: Alert, cooperative, no distress, Head: Normocephalic, without obvious abnormality, atraumatic Eyes: PERRL, conjunctiva/corneas clear, EOM's intact,    Ears: Normal  Throat: Normal  Neck: Supple, symmetrical, trachea midline, no adenopathy; thyroid: No enlargement/tenderness/nodules; no carotid bruit or JVD Back: Symmetric, no curvature, ROM normal, no CVA tenderness Lungs: Clear to auscultation bilaterally, respirations unlabored Heart: Regular rate and rhythm, no murmur, rub or gallop Abdomen: Soft, non-tender,, no masses, no organomegaly Extremities: Extremities normal, atraumatic, no cyanosis or edema Pulses: 2+ and symmetric all extremities Skin: Skin color, texture, turgor normal, no rashes or lesions  NEUROLOGIC:   Mental status: alert and oriented, no aphasia, good attention span, Fund of knowledge/ memory ok Motor Exam - grossly normal Sensory Exam - grossly normal Reflexes:  Coordination - grossly normal Gait - grossly normal Balance - grossly normal Cranial Nerves: I: smell Not tested  II: visual acuity  OS: Normal  OD: Normal   II: visual fields Full to confrontation  II: pupils Equal, round, reactive to light  III,VII: ptosis None  III,IV,VI: extraocular muscles  Full ROM  V: mastication Normal  V: facial light touch sensation  Normal  V,VII: corneal reflex  Present  VII: facial muscle function - upper  Normal  VII: facial muscle function - lower Normal  VIII: hearing Not tested  IX: soft palate elevation  Normal  IX,X: gag reflex Present  XI: trapezius strength  5/5  XI: sternocleidomastoid strength 5/5  XI: neck flexion strength  5/5  XII: tongue strength  Normal    Data Review Lab Results   Component Value Date   WBC 6.2 10/30/2013   HGB 10.4* 10/30/2013   HCT 30.3* 10/30/2013   MCV 90.4 10/30/2013   PLT 266 10/30/2013   Lab Results  Component Value Date   NA 138 10/30/2013   K 3.9 10/30/2013   CL 98 10/30/2013   CO2 27 10/30/2013   BUN 10 10/30/2013   CREATININE 0.76 10/30/2013   GLUCOSE 80 10/30/2013   Lab Results  Component Value Date   INR 1.27 06/17/2013    Assessment/Plan: L2 compression fracture, lumbago: I discussed the situation with the patient. We have discussed the various treatment options including surgery. I described the surgical treatment option of an L2 kyphoplasty. I've shown her surgical models. We have discussed the risks, benefits, alternatives, and likelihood of achieving our goals with surgery. I've answered all the patient's questions. She has decided proceed with surgery.   Dellis Filbert  D Macsen Nuttall 11/05/2013 1:28 PM

## 2013-11-05 NOTE — Progress Notes (Signed)
After speaking with patient, she informed [name] that she had fallen 3 weeks ago.  Went to see Dr. Lorin Mercy, and has 1 fracture in left wrist and 2 fractures in left foot per pt.  Neither are casted or splinted as she was suppose to return to see Lorin Mercy today.  Jonita Albee, RN spoke with Theadora Rama from Neuro and relayed this info.  No new orders received.  DA

## 2013-11-05 NOTE — Transfer of Care (Signed)
Immediate Anesthesia Transfer of Care Note  Patient: [name]  Procedure(s) Performed: Procedure(s): KYPHOPLASTY,LUMBAR TWO (N/A)  Patient Location: PACU  Anesthesia Type:General  Level of Consciousness: awake, alert  and oriented  Airway & Oxygen Therapy: Patient Spontanous Breathing and Patient connected to nasal cannula oxygen  Post-op Assessment: Report given to PACU RN, Post -op Vital signs reviewed and stable and Patient moving all extremities X 4  Post vital signs: Reviewed and stable  Complications: No apparent anesthesia complications

## 2013-11-06 MED ORDER — DSS 100 MG PO CAPS: 100.0000 mg | ORAL_CAPSULE | Freq: Two times a day (BID) | ORAL | Status: DC

## 2013-11-06 MED ORDER — HYDROCODONE-ACETAMINOPHEN 5-325 MG PO TABS: 1.0000 | ORAL_TABLET | ORAL | Status: DC | PRN

## 2013-11-06 NOTE — Progress Notes (Signed)
Pt given D/C instructions with Rx, verbal understanding was given by Pt. Pt's IV was removed prior to D/C. Pt D/C'd home via wheelchair with husband @ 1150 per MD order. Pt is stable @ D/C and has no other needs. Holli Humbles, RN

## 2013-11-06 NOTE — Discharge Summary (Signed)
Physician Discharge Summary  Patient ID: [name] MRN: [id number] DOB/AGE: [birth year] 77 y.o.  Admit date: 11/05/2013 Discharge date: 11/06/2013  Admission Diagnoses: L2 compression fracture, lumbago  Discharge Diagnoses: The same Active Problems:   Lumbar compression fracture   Discharged Condition: good  Hospital Course: I performed a L2 kyphoplasty on the patient on 11/05/2013. The surgery went well.  The patient's postoperative course was unremarkable. On postop day #1 she requested discharge to home. She was given oral and written discharge instructions. All her questions were answered.  Consults: None Significant Diagnostic Studies: None Treatments: L2 kyphoplasty Discharge Exam: Blood pressure 94/56, pulse 89, temperature 99.2 F (37.3 C), temperature source Oral, resp. rate 18, weight 50.803 kg (112 lb), SpO2 90.00%. The patient is alert and pleasant. Her strength is normal in her lower extremities.  Disposition: Home  Discharge Orders   Future Orders Complete By Expires   Call MD for:  difficulty breathing, headache or visual disturbances  As directed    Call MD for:  extreme fatigue  As directed    Call MD for:  hives  As directed    Call MD for:  persistant dizziness or light-headedness  As directed    Call MD for:  persistant nausea and vomiting  As directed    Call MD for:  redness, tenderness, or signs of infection (pain, swelling, redness, odor or green/yellow discharge around incision site)  As directed    Call MD for:  severe uncontrolled pain  As directed    Call MD for:  temperature >100.4  As directed    Diet - low sodium heart healthy  As directed    Discharge instructions  As directed    Driving Restrictions  As directed    Increase activity slowly  As directed    Lifting restrictions  As directed    May shower / Bathe  As directed    Remove dressing in 48 hours  As directed        Medication List         ALPRAZolam 0.5 MG tablet  Commonly  known as:  XANAX  Take 1 tablet (0.5 mg total) by mouth 3 (three) times daily as needed for anxiety or sleep. For anxiety.     aspirin 325 MG tablet  Commonly known as:  BAYER ASPIRIN  Take 1 tablet (325 mg total) by mouth daily.     COREG 6.25 MG tablet  Generic drug:  carvedilol  Take 6.25 mg by mouth 2 (two) times daily with a meal.     Cyanocobalamin 1000 MCG/ML Liqd  Take 1 mL by mouth daily.     dicyclomine 20 MG tablet  Commonly known as:  BENTYL  Take 20 mg by mouth 3 (three) times daily.     DSS 100 MG Caps  Take 100 mg by mouth 2 (two) times daily.     escitalopram 10 MG tablet  Commonly known as:  LEXAPRO  Take 20 mg by mouth daily.     HYDROcodone-acetaminophen 5-325 MG per tablet  Commonly known as:  NORCO/VICODIN  Take 1-2 tablets by mouth every 4 (four) hours as needed for moderate pain.     lisinopril 5 MG tablet  Commonly known as:  PRINIVIL,ZESTRIL  Take 1 tablet (5 mg total) by mouth daily.     multivitamins ther. w/minerals Tabs tablet  Take 1 tablet by mouth daily.     nitroGLYCERIN 0.4 MG SL tablet  Commonly known as:  NITROSTAT  Place 1 tablet (0.4 mg total) under the tongue every 5 (five) minutes as needed. For chest pain.     omeprazole 20 MG capsule  Commonly known as:  PRILOSEC  Take 20 mg by mouth daily.     phenylephrine-shark liver oil-mineral oil-petrolatum 0.25-3-14-71.9 % rectal ointment  Commonly known as:  PREPARATION H  Place 1 application rectally 2 (two) times daily as needed for hemorrhoids.     traMADol 50 MG tablet  Commonly known as:  ULTRAM  Take 1 tablet (50 mg total) by mouth every 6 (six) hours as needed for moderate pain.     trolamine salicylate 10 % cream  Commonly known as:  ASPERCREME  Apply 1 application topically as needed for muscle pain.         Signed: Ophelia Charter 11/06/2013, 7:35 AM

## 2013-11-10 ENCOUNTER — Encounter (HOSPITAL_COMMUNITY): Payer: Self-pay | Admitting: Neurosurgery

## 2014-05-13 ENCOUNTER — Ambulatory Visit (INDEPENDENT_AMBULATORY_CARE_PROVIDER_SITE_OTHER): Payer: Medicare Other | Admitting: Cardiovascular Disease

## 2014-05-13 ENCOUNTER — Encounter: Payer: Self-pay | Admitting: Cardiovascular Disease

## 2014-05-13 VITALS — BP 152/74 | HR 57 | Ht 62.0 in | Wt 117.8 lb

## 2014-05-13 DIAGNOSIS — E785 Hyperlipidemia, unspecified: Secondary | ICD-10-CM

## 2014-05-13 NOTE — Progress Notes (Signed)
Background: The patient is followed for Takotsubo's cardiomyopathy. Her acute event occurred in 2007. Cardiac catheterization demonstrated mild nonobstructive CAD. A followup echocardiogram in December 2009 demonstrated normal left ventricular systolic function with an ejection fraction of 55-60% and no regional wall motion abnormalities. A nuclear scan in 2012 showed no ischemia. The gated LVEF was 82%.  HPI:  79 year old woman presenting for followup evaluation. She had a hip fracture several months ago and continues to have problems with pain and ambulation. She is going to arrange orthopedic followup in the near future. She's had more chest pain, but notes that she was off of Prilosec for about one month. Since she has started back her chest pain is nearly resolved. She does take nitroglycerin on occasion. She denies shortness of breath, edema, lightheadedness, presyncope, orthopnea, or PND.   Outpatient Encounter Prescriptions as of 05/13/2014  Medication Sig  . ALPRAZolam (XANAX) 0.5 MG tablet Take 1 tablet (0.5 mg total) by mouth 3 (three) times daily as needed for anxiety or sleep. For anxiety.  Marland Kitchen aspirin (BAYER ASPIRIN) 325 MG tablet Take 1 tablet (325 mg total) by mouth daily.  . carvedilol (COREG) 6.25 MG tablet Take 6.25 mg by mouth 2 (two) times daily with a meal.   . Cyanocobalamin 1000 MCG/ML LIQD Take 1 mL by mouth daily.  Marland Kitchen dicyclomine (BENTYL) 20 MG tablet Take 20 mg by mouth 3 (three) times daily.  . diphenoxylate-atropine (LOMOTIL) 2.5-0.025 MG per tablet Take 1 tablet by mouth 4 (four) times daily as needed for diarrhea or loose stools.   . docusate sodium 100 MG CAPS Take 100 mg by mouth 2 (two) times daily.  Marland Kitchen escitalopram (LEXAPRO) 10 MG tablet Take 20 mg by mouth daily.  Marland Kitchen HYDROcodone-acetaminophen (NORCO/VICODIN) 5-325 MG per tablet Take 1-2 tablets by mouth every 4 (four) hours as needed for moderate pain.  Marland Kitchen lisinopril (PRINIVIL,ZESTRIL) 5 MG tablet Take 1 tablet (5 mg  total) by mouth daily.  . Multiple Vitamins-Minerals (MULTIVITAMINS THER. W/MINERALS) TABS Take 1 tablet by mouth daily.   . nitroGLYCERIN (NITROSTAT) 0.4 MG SL tablet Place 1 tablet (0.4 mg total) under the tongue every 5 (five) minutes as needed. For chest pain.  Marland Kitchen omeprazole (PRILOSEC) 20 MG capsule Take 20 mg by mouth daily.    . phenylephrine-shark liver oil-mineral oil-petrolatum (PREPARATION H) 0.25-3-14-71.9 % rectal ointment Place 1 application rectally 2 (two) times daily as needed for hemorrhoids.  . traMADol (ULTRAM) 50 MG tablet Take 1 tablet (50 mg total) by mouth every 6 (six) hours as needed for moderate pain.  Marland Kitchen trolamine salicylate (ASPERCREME) 10 % cream Apply 1 application topically as needed for muscle pain.    Allergies  Allergen Reactions  . Sulfamethoxazole-Trimethoprim Hives    Past Medical History  Diagnosis Date  . Takotsubo syndrome   . Other and unspecified hyperlipidemia   . Esophageal reflux   . Osteoarthrosis, unspecified whether generalized or localized, lower leg   . IBS (irritable bowel syndrome)   . Diverticular disease   . Shortness of breath     when my heart is not doing right  . Anemia   . Depression   . Coronary artery disease   . Blood transfusion     2005 maybe  . Headache(784.0)     takes Prilosec  . Hypertension   . Falls frequently   . Anxiety     confusion    BP 152/74  Pulse 57  Ht 5\' 2"  (1.575 m)  Wt 117  lb 12.8 oz (53.434 kg)  BMI 21.54 kg/m2  PHYSICAL EXAM: Pt is alert and oriented, pleasant elderly woman in NAD HEENT: normal Neck: JVP - normal, carotids 2+= without bruits Lungs: CTA bilaterally CV: RRR without murmur or gallop Abd: soft, NT, Positive BS, no hepatomegaly Ext: no C/C/E, distal pulses intact and equal Skin: warm/dry no rash  EKG:  Sinus bradycardia 57 beats per minute, within normal limits.  ASSESSMENT AND PLAN: 1. Coronary artery disease, nonobstructive. A typical symptoms of angina, suspect  related to gastroesophageal reflux disease as well as stress/anxiety which is always been a big issue. Continue as needed use of nitroglycerin. Continue aspirin and carvedilol.  2. History of Takotsubo's cardiomyopathy. Her LV function has normalized. She will continue on carvedilol at her current dose. No further testing is indicated at this time.  3. Hypertension. Blood pressure elevated today. She has not taken any of her morning medications. In a combination of carvedilol and lisinopril.   For followup will see her back in one year.  Sherren Mocha 05/13/2014 11:48 AM

## 2014-05-13 NOTE — Patient Instructions (Signed)
Your physician wants you to follow-up in: 1 YEAR with Dr Cooper.  You will receive a reminder letter in the mail two months in advance. If you don't receive a letter, please call our office to schedule the follow-up appointment.  Your physician recommends that you continue on your current medications as directed. Please refer to the Current Medication list given to you today.  

## 2014-08-12 ENCOUNTER — Encounter (HOSPITAL_COMMUNITY): Payer: Self-pay | Admitting: Emergency Medicine

## 2014-08-12 ENCOUNTER — Emergency Department (HOSPITAL_COMMUNITY)
Admission: EM | Admit: 2014-08-12 | Discharge: 2014-08-12 | Disposition: A | Discharge status: Home / Self Care | Payer: Medicare Other | Attending: Emergency Medicine | Admitting: Emergency Medicine

## 2014-08-12 DIAGNOSIS — R319 Hematuria, unspecified: Secondary | ICD-10-CM | POA: Diagnosis not present

## 2014-08-12 DIAGNOSIS — K589 Irritable bowel syndrome without diarrhea: Secondary | ICD-10-CM | POA: Diagnosis not present

## 2014-08-12 DIAGNOSIS — F419 Anxiety disorder, unspecified: Secondary | ICD-10-CM | POA: Insufficient documentation

## 2014-08-12 DIAGNOSIS — K219 Gastro-esophageal reflux disease without esophagitis: Secondary | ICD-10-CM | POA: Diagnosis not present

## 2014-08-12 DIAGNOSIS — Z9889 Other specified postprocedural states: Secondary | ICD-10-CM | POA: Diagnosis not present

## 2014-08-12 DIAGNOSIS — I2510 Atherosclerotic heart disease of native coronary artery without angina pectoris: Secondary | ICD-10-CM | POA: Diagnosis not present

## 2014-08-12 DIAGNOSIS — I10 Essential (primary) hypertension: Secondary | ICD-10-CM | POA: Diagnosis not present

## 2014-08-12 DIAGNOSIS — F329 Major depressive disorder, single episode, unspecified: Secondary | ICD-10-CM | POA: Insufficient documentation

## 2014-08-12 DIAGNOSIS — Z7982 Long term (current) use of aspirin: Secondary | ICD-10-CM | POA: Insufficient documentation

## 2014-08-12 DIAGNOSIS — M1990 Unspecified osteoarthritis, unspecified site: Secondary | ICD-10-CM | POA: Diagnosis not present

## 2014-08-12 DIAGNOSIS — R109 Unspecified abdominal pain: Secondary | ICD-10-CM | POA: Diagnosis present

## 2014-08-12 DIAGNOSIS — Z79899 Other long term (current) drug therapy: Secondary | ICD-10-CM | POA: Insufficient documentation

## 2014-08-12 LAB — CBC WITH DIFFERENTIAL/PLATELET
Basophils Absolute: 0 10*3/uL (ref 0.0–0.1)
Basophils Relative: 0 % (ref 0–1)
EOS PCT: 0 % (ref 0–5)
Eosinophils Absolute: 0 10*3/uL (ref 0.0–0.7)
HEMATOCRIT: 34.3 % — AB (ref 36.0–46.0)
Hemoglobin: 12 g/dL (ref 12.0–15.0)
LYMPHS ABS: 0.3 10*3/uL — AB (ref 0.7–4.0)
LYMPHS PCT: 5 % — AB (ref 12–46)
MCH: 30.5 pg (ref 26.0–34.0)
MCHC: 35 g/dL (ref 30.0–36.0)
MCV: 87.1 fL (ref 78.0–100.0)
Monocytes Absolute: 0.4 10*3/uL (ref 0.1–1.0)
Monocytes Relative: 6 % (ref 3–12)
NEUTROS PCT: 89 % — AB (ref 43–77)
Neutro Abs: 6.5 10*3/uL (ref 1.7–7.7)
PLATELETS: 267 10*3/uL (ref 150–400)
RBC: 3.94 MIL/uL (ref 3.87–5.11)
RDW: 12.1 % (ref 11.5–15.5)
WBC: 7.3 10*3/uL (ref 4.0–10.5)

## 2014-08-12 LAB — URINE MICROSCOPIC-ADD ON

## 2014-08-12 LAB — COMPREHENSIVE METABOLIC PANEL
ALK PHOS: 104 U/L (ref 39–117)
ALT: 12 U/L (ref 0–35)
AST: 21 U/L (ref 0–37)
Albumin: 3.5 g/dL (ref 3.5–5.2)
Anion gap: 10 (ref 5–15)
BILIRUBIN TOTAL: 0.7 mg/dL (ref 0.3–1.2)
BUN: 13 mg/dL (ref 6–23)
CALCIUM: 8.9 mg/dL (ref 8.4–10.5)
CO2: 24 mmol/L (ref 19–32)
CREATININE: 0.85 mg/dL (ref 0.50–1.10)
Chloride: 103 mEq/L (ref 96–112)
GFR calc non Af Amer: 64 mL/min — ABNORMAL LOW (ref >90)
GFR, EST AFRICAN AMERICAN: 75 mL/min — AB (ref >90)
GLUCOSE: 136 mg/dL — AB (ref 70–99)
Potassium: 3.7 mmol/L (ref 3.5–5.1)
Sodium: 137 mmol/L (ref 135–145)
TOTAL PROTEIN: 6.7 g/dL (ref 6.0–8.3)

## 2014-08-12 LAB — URINALYSIS, ROUTINE W REFLEX MICROSCOPIC
Bilirubin Urine: NEGATIVE
Glucose, UA: NEGATIVE mg/dL
Ketones, ur: 15 mg/dL — AB
LEUKOCYTES UA: NEGATIVE
Nitrite: NEGATIVE
PH: 6 (ref 5.0–8.0)
Protein, ur: NEGATIVE mg/dL
Specific Gravity, Urine: 1.023 (ref 1.005–1.030)
UROBILINOGEN UA: 0.2 mg/dL (ref 0.0–1.0)

## 2014-08-12 LAB — LIPASE, BLOOD: LIPASE: 24 U/L (ref 11–59)

## 2014-08-12 MED ORDER — SODIUM CHLORIDE 0.9 % IV BOLUS (SEPSIS)
500.0000 mL | Freq: Once | INTRAVENOUS | Status: AC
  Administered 2014-08-12: 500 mL via INTRAVENOUS

## 2014-08-12 MED ORDER — LORAZEPAM 2 MG/ML IJ SOLN
0.2500 mg | Freq: Once | INTRAMUSCULAR | Status: AC
  Administered 2014-08-12: 0.25 mg via INTRAVENOUS
  Filled 2014-08-12: qty 1

## 2014-08-12 MED ORDER — ONDANSETRON HCL 4 MG/2ML IJ SOLN
4.0000 mg | Freq: Once | INTRAMUSCULAR | Status: AC
  Administered 2014-08-12: 4 mg via INTRAVENOUS
  Filled 2014-08-12: qty 2

## 2014-08-12 NOTE — ED Notes (Signed)
Per EMS Patient comes from home states Nausea/vomiting and diarrhea. States black stools watery stools. Patient states light headedness and dizziness.

## 2014-08-12 NOTE — ED Provider Notes (Signed)
Patient here with exacerbation of her chronic abdominal pain secondary to IBS. No evidence of surgical process at this time. Labs are reassuring. We'll likely discharge  Medical screening examination/treatment/procedure(s) were conducted as a shared visit with non-physician practitioner(s) and myself.  I personally evaluated the patient during the encounter.   EKG Interpretation None       Leota Jacobsen, MD 08/12/14 [phone]

## 2014-08-12 NOTE — ED Provider Notes (Signed)
CSN: [id number]     Arrival date & time 08/12/14  1544 History   First MD Initiated Contact with Patient 08/12/14 1546     Chief Complaint  Patient presents with  . Abdominal Pain     (Consider location/radiation/quality/duration/timing/severity/associated sxs/prior Treatment) HPI    PCP: Woody Seller, MD Blood pressure 161/77, pulse 82, temperature 98.5 F (36.9 C), temperature source Oral, resp. rate 17, height 5\' 2"  (1.575 m), weight 114 lb (51.71 kg), SpO2 98 %.  [name] is a 80 y.o.female with a significant PMH of Takotsubo syndrome, GERD, IBS, diverticular disease, depression, anemia, hx of blood transfusion, hypertension, SOB, anxiety, falls frequently, presents to the ER with complaints of abdominal pain that is worse over the past 3 days that is worse than her chronic pain. It is intermittent. She reports having 50 episodes of diarrhea over the past 3 days and multiple episodes of vomiting in the past 3 days with intermittent constipation. She describes the pain as periumbilical and sharp. It lasts a couple or hours and then eases up after a few hours. She gets relief when she turns over on her side. She also endorses noticing blood that is like "a nose bleed". She is unable to qualify more specifically the quantity of bleeding. Denies SOB, CP, weakness, fevers, coughing, neck pain, or headaches.   Past Medical History  Diagnosis Date  . Takotsubo syndrome   . Other and unspecified hyperlipidemia   . Esophageal reflux   . Osteoarthrosis, unspecified whether generalized or localized, lower leg   . IBS (irritable bowel syndrome)   . Diverticular disease   . Shortness of breath     when my heart is not doing right  . Anemia   . Depression   . Coronary artery disease   . Blood transfusion     2005 maybe  . Headache(784.0)     takes Prilosec  . Hypertension   . Falls frequently   . Anxiety     confusion   Past Surgical History  Procedure Laterality Date  . Knee  surgery    . Hysterotomy    . Breast reconstruction    . Implantable contact lens implantation    . Cataract extraction w/ intraocular lens  implant, bilateral    . Tonsillectomy      as child  . Back surgery    . Hip arthroplasty Right 06/10/2013    Procedure: ARTHROPLASTY MONOPOLAR HIP CEMENTED;  Surgeon: Marybelle Killings, MD;  Location: [facility];  Service: Orthopedics;  Laterality: Right;  . Joint replacement    . Fracture surgery    . Kyphoplasty N/A 11/05/2013    Procedure: Jone Baseman TWO;  Surgeon: Ophelia Charter, MD;  Location: [facility];  Service: Neurosurgery;  Laterality: N/A;   No family history on file. History  Substance Use Topics  . Smoking status: Never Smoker   . Smokeless tobacco: Never Used  . Alcohol Use: No   OB History    No data available     Review of Systems  10 Systems reviewed and are negative for acute change except as noted in the HPI.   Allergies  Sulfamethoxazole-trimethoprim  Home Medications   Prior to Admission medications   Medication Sig Start Date End Date Taking? Authorizing Provider  ALPRAZolam Duanne Moron) 0.5 MG tablet Take 1 tablet (0.5 mg total) by mouth 3 (three) times daily as needed for anxiety or sleep. For anxiety. 06/22/13  Yes Annita Brod, MD  aspirin EC 81  MG tablet Take 81 mg by mouth daily.   Yes Historical Provider, MD  carvedilol (COREG) 6.25 MG tablet Take 6.25 mg by mouth 2 (two) times daily with a meal.    Yes Historical Provider, MD  dicyclomine (BENTYL) 20 MG tablet Take 20 mg by mouth 2 (two) times daily.    Yes Historical Provider, MD  diphenoxylate-atropine (LOMOTIL) 2.5-0.025 MG per tablet Take 1 tablet by mouth 4 (four) times daily as needed for diarrhea or loose stools.  04/27/14  Yes Historical Provider, MD  docusate sodium 100 MG CAPS Take 100 mg by mouth 2 (two) times daily. Patient taking differently: Take 100 mg by mouth daily.  11/06/13  Yes Newman Pies, MD  escitalopram (LEXAPRO) 10 MG tablet  Take 20 mg by mouth 2 (two) times daily.    Yes Historical Provider, MD  HYDROcodone-acetaminophen (NORCO/VICODIN) 5-325 MG per tablet Take 1-2 tablets by mouth every 4 (four) hours as needed for moderate pain. 11/06/13  Yes Newman Pies, MD  lisinopril (PRINIVIL,ZESTRIL) 5 MG tablet Take 1 tablet (5 mg total) by mouth daily. 06/22/13  Yes Annita Brod, MD  Multiple Vitamins-Minerals (MULTIVITAMINS THER. W/MINERALS) TABS Take 1 tablet by mouth daily.    Yes Historical Provider, MD  nitroGLYCERIN (NITROSTAT) 0.4 MG SL tablet Place 1 tablet (0.4 mg total) under the tongue every 5 (five) minutes as needed. For chest pain. 04/22/13  Yes Blane Ohara, MD  omeprazole (PRILOSEC) 20 MG capsule Take 20 mg by mouth daily.     Yes Historical Provider, MD  traMADol (ULTRAM) 50 MG tablet Take 1 tablet (50 mg total) by mouth every 6 (six) hours as needed for moderate pain. 06/14/13  Yes Velvet Bathe, MD  trolamine salicylate (ASPERCREME) 10 % cream Apply 1 application topically as needed (for back pain).    Yes Historical Provider, MD  aspirin (BAYER ASPIRIN) 325 MG tablet Take 1 tablet (325 mg total) by mouth daily. 06/10/13   Marybelle Killings, MD   BP 130/73 mmHg  Pulse 99  Temp(Src) 98.5 F (36.9 C) (Oral)  Resp 12  Ht 5\' 2"  (1.575 m)  Wt 114 lb (51.71 kg)  BMI 20.85 kg/m2  SpO2 95% Physical Exam  Constitutional: She appears well-developed and well-nourished. No distress.  HENT:  Head: Normocephalic and atraumatic.  Eyes: Pupils are equal, round, and reactive to light.  Neck: Normal range of motion. Neck supple.  Cardiovascular: Normal rate and regular rhythm.   Pulmonary/Chest: Effort normal.  Abdominal: Soft. Bowel sounds are normal. She exhibits distension (subjective). She exhibits no fluid wave. There is tenderness (mild diffuse) in the periumbilical area. There is no rigidity, no rebound and no guarding.  Genitourinary: Rectal exam shows external hemorrhoid. Rectal exam shows no fissure  and no tenderness. Guaiac negative stool.  Neurological: She is alert.  Skin: Skin is warm and dry.  Nursing note and vitals reviewed.   ED Course  Procedures (including critical care time) Labs Review Labs Reviewed  CBC WITH DIFFERENTIAL - Abnormal; Notable for the following:    HCT 34.3 (*)    Neutrophils Relative % 89 (*)    Lymphocytes Relative 5 (*)    Lymphs Abs 0.3 (*)    All other components within normal limits  COMPREHENSIVE METABOLIC PANEL - Abnormal; Notable for the following:    Glucose, Bld 136 (*)    GFR calc non Af Amer 64 (*)    GFR calc Af Amer 75 (*)    All other components  within normal limits  URINALYSIS, ROUTINE W REFLEX MICROSCOPIC - Abnormal; Notable for the following:    Hgb urine dipstick LARGE (*)    Ketones, ur 15 (*)    All other components within normal limits  URINE MICROSCOPIC-ADD ON - Abnormal; Notable for the following:    Bacteria, UA FEW (*)    All other components within normal limits  LIPASE, BLOOD  OCCULT BLOOD X 1 CARD TO LAB, STOOL    Imaging Review No results found.   EKG Interpretation None      MDM   Final diagnoses:  IBS (irritable bowel syndrome)  Hematuria   6: 40 pm Pt initially presents with complaints of intractable vomiting and diarrhea, throughout stay patient did not vomit or relay any diarrhea. Her lab work shows no evidence of dehydration, electrolyte abnormalities, acute pancreatitis, or infectious processes. She was given IV fluid to ensure hydration, Zofran for nausea prophylaxis, and Ativan to help with baseline anxiety/confusion. If able to withstand PO challenge she will be discharged home and encouraged to F/U with GI. Pt discussed with Dr. Zenia Resides who has seen patient as well and agrees with plan. Urinalysis is still pending.  8: 24 pm-urinalysis shows no infection, + hematuria as shown on previous urinalysis. She reports having her medications for her IBS at home. PO challenge in the ED successful, during  her stay she had no nausea, vomiting or diarrhea. Dr. Zenia Resides has seen patient as well and agrees patient is ready for dc.  80 y.o.[name]'s evaluation in the Emergency Department is complete. It has been determined that no acute conditions requiring further emergency intervention are present at this time. The patient/guardian have been advised of the diagnosis and plan. We have discussed signs and symptoms that warrant return to the ED, such as changes or worsening in symptoms.  Vital signs are stable at discharge. Filed Vitals:   08/12/14 1930  BP: 130/73  Pulse: 99  Temp:   Resp: 12    Patient/guardian has voiced understanding and agreed to follow-up with the PCP or specialist.    Linus Mako, PA-C 08/12/14 2025  Leota Jacobsen, MD 08/15/14 1505

## 2014-08-12 NOTE — Discharge Instructions (Signed)
Irritable Bowel Syndrome Irritable bowel syndrome (IBS) is caused by a disturbance of normal bowel function and is a common digestive disorder. You may also hear this condition called spastic colon, mucous colitis, and irritable colon. There is no cure for IBS. However, symptoms often gradually improve or disappear with a good diet, stress management, and medicine. This condition usually appears in late adolescence or early adulthood. Women develop it twice as often as men. CAUSES  After food has been digested and absorbed in the small intestine, waste material is moved into the large intestine, or colon. In the colon, water and salts are absorbed from the undigested products coming from the small intestine. The remaining residue, or fecal material, is held for elimination. Under normal circumstances, gentle, rhythmic contractions of the bowel walls push the fecal material along the colon toward the rectum. In IBS, however, these contractions are irregular and poorly coordinated. The fecal material is either retained too long, resulting in constipation, or expelled too soon, producing diarrhea. SIGNS AND SYMPTOMS  The most common symptom of IBS is abdominal pain. It is often in the lower left side of the abdomen, but it may occur anywhere in the abdomen. The pain comes from spasms of the bowel muscles happening too much and from the buildup of gas and fecal material in the colon. This pain:  Can range from sharp abdominal cramps to a dull, continuous ache.  Often worsens soon after eating.  Is often relieved by having a bowel movement or passing gas. Abdominal pain is usually accompanied by constipation, but it may also produce diarrhea. The diarrhea often occurs right after a meal or upon waking up in the morning. The stools are often soft, watery, and flecked with mucus. Other symptoms of IBS include:  Bloating.  Loss of appetite.  Heartburn.  Backache.  Dull pain in the arms or  shoulders.  Nausea.  Burping.  Vomiting.  Gas. IBS may also cause symptoms that are unrelated to the digestive system, such as:  Fatigue.  Headaches.  Anxiety.  Shortness of breath.  Trouble concentrating.  Dizziness. These symptoms tend to come and go. DIAGNOSIS  The symptoms of IBS may seem like symptoms of other, more serious digestive disorders. Your health care provider may want to perform tests to exclude these disorders.  TREATMENT Many medicines are available to help correct bowel function or relieve bowel spasms and abdominal pain. Among the medicines available are:  Laxatives for severe constipation and to help restore normal bowel habits.  Specific antidiarrheal medicines to treat severe or lasting diarrhea.  Antispasmodic agents to relieve intestinal cramps. Your health care provider may also decide to treat you with a mild tranquilizer or sedative during unusually stressful periods in your life. Your health care provider may also prescribe antidepressant medicine. The use of this medicine has been shown to reduce pain and other symptoms of IBS. Remember that if any medicine is prescribed for you, you should take it exactly as directed. Make sure your health care provider knows how well it worked for you. HOME CARE INSTRUCTIONS   Take all medicines as directed by your health care provider.  Avoid foods that are high in fat or oils, such as heavy cream, butter, frankfurters, sausage, and other fatty meats.  Avoid foods that make you go to the bathroom, such as fruit, fruit juice, and dairy products.  Cut out carbonated drinks, chewing gum, and "gassy" foods such as beans and cabbage. This may help relieve bloating and burping.    Eat foods with bran, and drink plenty of liquids with the bran foods. This helps relieve constipation.  Keep track of what foods seem to bring on your symptoms.  Avoid emotionally charged situations or circumstances that produce  anxiety.  Start or continue exercising.  Get plenty of rest and sleep. Document Released: 07/09/2005 Document Revised: 07/14/2013 Document Reviewed: 02/27/2008 [employer] Patient Information 2015 [employer]. This information is not intended to replace advice given to you by your health care provider. Make sure you discuss any questions you have with your health care provider.  

## 2014-08-17 ENCOUNTER — Other Ambulatory Visit (HOSPITAL_COMMUNITY): Payer: Self-pay | Admitting: Family Medicine

## 2014-08-17 DIAGNOSIS — R109 Unspecified abdominal pain: Secondary | ICD-10-CM

## 2014-08-20 ENCOUNTER — Ambulatory Visit (HOSPITAL_COMMUNITY): Payer: Medicare Other

## 2014-08-23 ENCOUNTER — Ambulatory Visit (HOSPITAL_COMMUNITY)
Admission: RE | Admit: 2014-08-23 | Discharge: 2014-08-23 | Disposition: A | Discharge status: Home / Self Care | Payer: Medicare Other | Source: Ambulatory Visit | Attending: Family Medicine | Admitting: Family Medicine

## 2014-08-23 DIAGNOSIS — R109 Unspecified abdominal pain: Secondary | ICD-10-CM

## 2014-08-24 ENCOUNTER — Other Ambulatory Visit: Payer: Self-pay | Admitting: Neurosurgery

## 2014-08-24 DIAGNOSIS — S22000A Wedge compression fracture of unspecified thoracic vertebra, initial encounter for closed fracture: Secondary | ICD-10-CM

## 2014-11-20 ENCOUNTER — Emergency Department (HOSPITAL_COMMUNITY): Payer: Medicare Other

## 2014-11-20 ENCOUNTER — Observation Stay (HOSPITAL_COMMUNITY)
Admission: EM | Admit: 2014-11-20 | Discharge: 2014-11-23 | Disposition: A | Discharge status: Home / Self Care | Payer: Medicare Other | Attending: Internal Medicine | Admitting: Internal Medicine

## 2014-11-20 ENCOUNTER — Encounter (HOSPITAL_COMMUNITY): Payer: Self-pay | Admitting: Emergency Medicine

## 2014-11-20 DIAGNOSIS — R7989 Other specified abnormal findings of blood chemistry: Secondary | ICD-10-CM | POA: Diagnosis present

## 2014-11-20 DIAGNOSIS — Z96641 Presence of right artificial hip joint: Secondary | ICD-10-CM | POA: Insufficient documentation

## 2014-11-20 DIAGNOSIS — E785 Hyperlipidemia, unspecified: Secondary | ICD-10-CM | POA: Insufficient documentation

## 2014-11-20 DIAGNOSIS — Z90710 Acquired absence of both cervix and uterus: Secondary | ICD-10-CM | POA: Insufficient documentation

## 2014-11-20 DIAGNOSIS — K219 Gastro-esophageal reflux disease without esophagitis: Secondary | ICD-10-CM | POA: Diagnosis not present

## 2014-11-20 DIAGNOSIS — M25551 Pain in right hip: Secondary | ICD-10-CM | POA: Insufficient documentation

## 2014-11-20 DIAGNOSIS — M25562 Pain in left knee: Secondary | ICD-10-CM | POA: Diagnosis not present

## 2014-11-20 DIAGNOSIS — E876 Hypokalemia: Secondary | ICD-10-CM | POA: Diagnosis not present

## 2014-11-20 DIAGNOSIS — M25552 Pain in left hip: Secondary | ICD-10-CM | POA: Insufficient documentation

## 2014-11-20 DIAGNOSIS — J9600 Acute respiratory failure, unspecified whether with hypoxia or hypercapnia: Secondary | ICD-10-CM | POA: Insufficient documentation

## 2014-11-20 DIAGNOSIS — R55 Syncope and collapse: Secondary | ICD-10-CM | POA: Diagnosis present

## 2014-11-20 DIAGNOSIS — M549 Dorsalgia, unspecified: Secondary | ICD-10-CM | POA: Insufficient documentation

## 2014-11-20 DIAGNOSIS — Z23 Encounter for immunization: Secondary | ICD-10-CM | POA: Insufficient documentation

## 2014-11-20 DIAGNOSIS — W19XXXA Unspecified fall, initial encounter: Secondary | ICD-10-CM | POA: Diagnosis not present

## 2014-11-20 DIAGNOSIS — J9602 Acute respiratory failure with hypercapnia: Secondary | ICD-10-CM

## 2014-11-20 DIAGNOSIS — R519 Headache, unspecified: Secondary | ICD-10-CM

## 2014-11-20 DIAGNOSIS — I2510 Atherosclerotic heart disease of native coronary artery without angina pectoris: Secondary | ICD-10-CM | POA: Insufficient documentation

## 2014-11-20 DIAGNOSIS — I10 Essential (primary) hypertension: Secondary | ICD-10-CM | POA: Insufficient documentation

## 2014-11-20 DIAGNOSIS — R0602 Shortness of breath: Secondary | ICD-10-CM

## 2014-11-20 DIAGNOSIS — J9601 Acute respiratory failure with hypoxia: Secondary | ICD-10-CM | POA: Diagnosis present

## 2014-11-20 DIAGNOSIS — F411 Generalized anxiety disorder: Secondary | ICD-10-CM | POA: Diagnosis present

## 2014-11-20 DIAGNOSIS — K529 Noninfective gastroenteritis and colitis, unspecified: Secondary | ICD-10-CM | POA: Diagnosis present

## 2014-11-20 DIAGNOSIS — R51 Headache: Secondary | ICD-10-CM

## 2014-11-20 LAB — CBC
HCT: 32.9 % — ABNORMAL LOW (ref 36.0–46.0)
HEMOGLOBIN: 11 g/dL — AB (ref 12.0–15.0)
MCH: 30.6 pg (ref 26.0–34.0)
MCHC: 33.4 g/dL (ref 30.0–36.0)
MCV: 91.6 fL (ref 78.0–100.0)
Platelets: 188 10*3/uL (ref 150–400)
RBC: 3.59 MIL/uL — ABNORMAL LOW (ref 3.87–5.11)
RDW: 13.3 % (ref 11.5–15.5)
WBC: 10.2 10*3/uL (ref 4.0–10.5)

## 2014-11-20 LAB — BASIC METABOLIC PANEL
Anion gap: 9 (ref 5–15)
BUN: 9 mg/dL (ref 6–23)
CALCIUM: 8.5 mg/dL (ref 8.4–10.5)
CO2: 25 mmol/L (ref 19–32)
CREATININE: 0.8 mg/dL (ref 0.50–1.10)
Chloride: 101 mmol/L (ref 96–112)
GFR calc Af Amer: 80 mL/min — ABNORMAL LOW (ref >90)
GFR calc non Af Amer: 69 mL/min — ABNORMAL LOW (ref >90)
GLUCOSE: 104 mg/dL — AB (ref 70–99)
Potassium: 3.2 mmol/L — ABNORMAL LOW (ref 3.5–5.1)
Sodium: 135 mmol/L (ref 135–145)

## 2014-11-20 MED ORDER — POTASSIUM CHLORIDE CRYS ER 20 MEQ PO TBCR
40.0000 meq | EXTENDED_RELEASE_TABLET | Freq: Once | ORAL | Status: AC
  Administered 2014-11-20: 40 meq via ORAL
  Filled 2014-11-20: qty 2

## 2014-11-20 MED ORDER — MORPHINE SULFATE 4 MG/ML IJ SOLN
4.0000 mg | Freq: Once | INTRAMUSCULAR | Status: AC
  Administered 2014-11-20: 4 mg via INTRAVENOUS
  Filled 2014-11-20: qty 1

## 2014-11-20 NOTE — ED Notes (Signed)
Pt arrived by [facility] with c/o fall. Pt was standing at the sink and fell. Denies being dizzy or anything causing her to fall. Pt did hit head, denies LOC. Pt c/o left knee pain, coccyx pain and right hip pain. Pt has previous partial hip replacement to right hip from previous fx. VS WNL Pt also chronic pain to back and has not had medications for pain today.

## 2014-11-20 NOTE — H&P (Signed)
[facility] Admission History and Physical       [name] YSA:[id number] DOB: February 28, 1935 DOA: 11/20/2014  Referring physician: EDP PCP: Woody Seller, MD  Specialists:   Chief Complaint: Passed Out  HPI: [name] is a 80 y.o. female with a history of Takotsubo Syndrome, CAD, HTN. Hyperlipidemia, and IBS who presents to the ED with complaints of passing out.  She reports that she was grooming her dog in her kitchen and she must have passed out and as she fell she hit head on the sink.   Her husband heard her fall and came found her on the Floor, when she came around she was hurting all over.   She denies any prodrome, and denies and chest pain.   She was referred for further evaluation of syncope.      Review of Systems:  Constitutional: No Weight Loss, No Weight Gain, Night Sweats, Fevers, Chills, Dizziness, Light Headedness, Fatigue, or Generalized Weakness HEENT: No Headaches, Difficulty Swallowing,Tooth/Dental Problems,Sore Throat,  No Sneezing, Rhinitis, Ear Ache, Nasal Congestion, or Post Nasal Drip,  Cardio-vascular:  No Chest pain, Orthopnea, PND, Edema in Lower Extremities, Anasarca, Dizziness, Palpitations  Resp: No Dyspnea, No DOE, No Productive Cough, No Non-Productive Cough, No Hemoptysis, No Wheezing.    GI: No Heartburn, Indigestion, Abdominal Pain, Nausea, Vomiting, Diarrhea, Constipation, Hematemesis, Hematochezia, Melena, Change in Bowel Habits,  Loss of Appetite  GU: No Dysuria, No Change in Color of Urine, No Urgency or Urinary Frequency, No Flank pain.  Musculoskeletal: No Joint Pain or Swelling, No Decreased Range of Motion, No Back Pain.  Neurologic: +Syncope, No Seizures, Muscle Weakness, Paresthesia, Vision Disturbance or Loss, No Diplopia, No Vertigo, No Difficulty Walking,  Skin: No Rash or Lesions. Psych: No Change in Mood or Affect, No Depression or Anxiety, No Memory loss, No Confusion, or Hallucinations   Past Medical History  Diagnosis  Date  . Takotsubo syndrome   . Other and unspecified hyperlipidemia   . Esophageal reflux   . Osteoarthrosis, unspecified whether generalized or localized, lower leg   . IBS (irritable bowel syndrome)   . Diverticular disease   . Shortness of breath     when my heart is not doing right  . Anemia   . Depression   . Coronary artery disease   . Blood transfusion     2005 maybe  . Headache(784.0)     takes Prilosec  . Hypertension   . Falls frequently   . Anxiety     confusion     Past Surgical History  Procedure Laterality Date  . Knee surgery    . Hysterotomy    . Breast reconstruction    . Implantable contact lens implantation    . Cataract extraction w/ intraocular lens  implant, bilateral    . Tonsillectomy      as child  . Back surgery    . Hip arthroplasty Right 06/10/2013    Procedure: ARTHROPLASTY MONOPOLAR HIP CEMENTED;  Surgeon: Marybelle Killings, MD;  Location: [facility];  Service: Orthopedics;  Laterality: Right;  . Joint replacement    . Fracture surgery    . Kyphoplasty N/A 11/05/2013    Procedure: Jone Baseman TWO;  Surgeon: Ophelia Charter, MD;  Location: [place] [facility];  Service: Neurosurgery;  Laterality: N/A;      Prior to Admission medications   Medication Sig Start Date End Date Taking? Authorizing Provider  ALPRAZolam Duanne Moron) 0.5 MG tablet Take 1 tablet (0.5 mg total) by mouth 3 (three) times  daily as needed for anxiety or sleep. For anxiety. 06/22/13   Annita Brod, MD  aspirin (BAYER ASPIRIN) 325 MG tablet Take 1 tablet (325 mg total) by mouth daily. 06/10/13   Marybelle Killings, MD  aspirin EC 81 MG tablet Take 81 mg by mouth daily.    Historical Provider, MD  carvedilol (COREG) 6.25 MG tablet Take 6.25 mg by mouth 2 (two) times daily with a meal.     Historical Provider, MD  dicyclomine (BENTYL) 20 MG tablet Take 20 mg by mouth 2 (two) times daily.     Historical Provider, MD  diphenoxylate-atropine (LOMOTIL) 2.5-0.025 MG per tablet Take 1 tablet by  mouth 4 (four) times daily as needed for diarrhea or loose stools.  04/27/14   Historical Provider, MD  docusate sodium 100 MG CAPS Take 100 mg by mouth 2 (two) times daily. Patient taking differently: Take 100 mg by mouth daily.  11/06/13   Newman Pies, MD  escitalopram (LEXAPRO) 10 MG tablet Take 20 mg by mouth 2 (two) times daily.     Historical Provider, MD  HYDROcodone-acetaminophen (NORCO/VICODIN) 5-325 MG per tablet Take 1-2 tablets by mouth every 4 (four) hours as needed for moderate pain. 11/06/13   Newman Pies, MD  lisinopril (PRINIVIL,ZESTRIL) 5 MG tablet Take 1 tablet (5 mg total) by mouth daily. 06/22/13   Annita Brod, MD  Multiple Vitamins-Minerals (MULTIVITAMINS THER. W/MINERALS) TABS Take 1 tablet by mouth daily.     Historical Provider, MD  nitroGLYCERIN (NITROSTAT) 0.4 MG SL tablet Place 1 tablet (0.4 mg total) under the tongue every 5 (five) minutes as needed. For chest pain. 04/22/13   Sherren Mocha, MD  omeprazole (PRILOSEC) 20 MG capsule Take 20 mg by mouth daily.      Historical Provider, MD  traMADol (ULTRAM) 50 MG tablet Take 1 tablet (50 mg total) by mouth every 6 (six) hours as needed for moderate pain. 06/14/13   Velvet Bathe, MD  trolamine salicylate (ASPERCREME) 10 % cream Apply 1 application topically as needed (for back pain).     Historical Provider, MD     Allergies  Allergen Reactions  . Sulfamethoxazole-Trimethoprim Hives    Social History:  reports that she has never smoked. She has never used smokeless tobacco. She reports that she does not drink alcohol or use illicit drugs.    No family history on file.     Physical Exam:  GEN:  Pleasant Thin Elderly 80 y.o. Caucasian female examined and in no acute distress; cooperative with exam Filed Vitals:   11/20/14 2130 11/20/14 2200 11/20/14 2230 11/20/14 2300  BP: 143/85 132/87 123/64 116/73  Pulse: 106 94 93 95  Temp:      TempSrc:      Resp: 22 15 17 17   SpO2: 100% 98% 100% 98%   Blood  pressure 116/73, pulse 95, temperature 98 F (36.7 C), temperature source Oral, resp. rate 17, SpO2 98 %. PSYCH: She is alert and oriented x4; does not appear anxious does not appear depressed; affect is normal HEENT: Normocephalic and Atraumatic, Mucous membranes pink; PERRLA; EOM intact; Fundi:  Benign;  No scleral icterus, Nares: Patent, Oropharynx: Clear, Edentulous with Dentures    Neck:  FROM, No Cervical Lymphadenopathy nor Thyromegaly or Carotid Bruit; No JVD; Breasts:: Not examined CHEST WALL: No tenderness CHEST: Normal respiration, clear to auscultation bilaterally HEART: Regular rate and rhythm; no murmurs rubs or gallops BACK: No kyphosis or scoliosis; No CVA tenderness ABDOMEN: Positive Bowel Sounds, Scaphoid,  Soft Non-Tender, No Rebound or Guarding; No Masses, No Organomegaly Rectal Exam: Not done EXTREMITIES: No Cyanosis, Clubbing, or Edema; No Ulcerations. Genitalia: not examined PULSES: 2+ and symmetric SKIN: Normal hydration no rash or ulceration CNS:  Alert and Oriented x 4, No Focal Deficits.  Vascular: pulses palpable throughout    Labs on Admission:  Basic Metabolic Panel:  Recent Labs Lab 11/20/14 2025  NA 135  K 3.2*  CL 101  CO2 25  GLUCOSE 104*  BUN 9  CREATININE 0.80  CALCIUM 8.5   Liver Function Tests: No results for input(s): AST, ALT, ALKPHOS, BILITOT, PROT, ALBUMIN in the last 168 hours. No results for input(s): LIPASE, AMYLASE in the last 168 hours. No results for input(s): AMMONIA in the last 168 hours. CBC:  Recent Labs Lab 11/20/14 2025  WBC 10.2  HGB 11.0*  HCT 32.9*  MCV 91.6  PLT 188   Cardiac Enzymes: No results for input(s): CKTOTAL, CKMB, CKMBINDEX, TROPONINI in the last 168 hours.  BNP (last 3 results) No results for input(s): BNP in the last 8760 hours.  ProBNP (last 3 results) No results for input(s): PROBNP in the last 8760 hours.  CBG: No results for input(s): GLUCAP in the last 168 hours.  Radiological Exams  on Admission: Ct Head Wo Contrast  11/20/2014   CLINICAL DATA:  Patient fell in her bathroom and hit her head with, no loss of consciousness  EXAM: CT HEAD WITHOUT CONTRAST  CT CERVICAL SPINE WITHOUT CONTRAST  TECHNIQUE: Multidetector CT imaging of the head and cervical spine was performed following the standard protocol without intravenous contrast. Multiplanar CT image reconstructions of the cervical spine were also generated.  COMPARISON:  06/16/2013  FINDINGS: CT HEAD FINDINGS  Moderate atrophy. No abnormal attenuation to suggest hemorrhage extra-axial fluid infarct or mass. Mild low attenuation in the deep white matter consistent with chronic small vessel ischemia. No skull fracture.  CT CERVICAL SPINE FINDINGS  Right apical pleural parenchymal scarring is stable. No evidence of cervical spine fracture. Normal anterior-posterior alignment. Degenerative disc disease and degenerative facet change throughout the spine particularly involving the right facet joints at C5, C6, and C7, with fusion at C5-6 on the right and near fusion at C6-7 as well as at C7-T1.  IMPRESSION: No acute intracranial abnormalities. No evidence of cervical spine fracture.   Electronically Signed   By: Skipper Cliche M.D.   On: 11/20/2014 20:39   Ct Cervical Spine Wo Contrast  11/20/2014   CLINICAL DATA:  Patient fell in her bathroom and hit her head with, no loss of consciousness  EXAM: CT HEAD WITHOUT CONTRAST  CT CERVICAL SPINE WITHOUT CONTRAST  TECHNIQUE: Multidetector CT imaging of the head and cervical spine was performed following the standard protocol without intravenous contrast. Multiplanar CT image reconstructions of the cervical spine were also generated.  COMPARISON:  06/16/2013  FINDINGS: CT HEAD FINDINGS  Moderate atrophy. No abnormal attenuation to suggest hemorrhage extra-axial fluid infarct or mass. Mild low attenuation in the deep white matter consistent with chronic small vessel ischemia. No skull fracture.  CT  CERVICAL SPINE FINDINGS  Right apical pleural parenchymal scarring is stable. No evidence of cervical spine fracture. Normal anterior-posterior alignment. Degenerative disc disease and degenerative facet change throughout the spine particularly involving the right facet joints at C5, C6, and C7, with fusion at C5-6 on the right and near fusion at C6-7 as well as at C7-T1.  IMPRESSION: No acute intracranial abnormalities. No evidence of cervical spine fracture.  Electronically Signed   By: Skipper Cliche M.D.   On: 11/20/2014 20:39   Ct Pelvis Wo Contrast  11/20/2014   CLINICAL DATA:  Fall, right hip/coccyx pain, status post right hip replacement  EXAM: CT PELVIS WITHOUT CONTRAST  TECHNIQUE: Multidetector CT imaging of the pelvis was performed following the standard protocol without intravenous contrast.  COMPARISON:  CT abdomen pelvis dated 06/18/2013  FINDINGS: Status post hysterectomy.  No adnexal masses.  Bladder is partially obscured by streak artifact but otherwise unremarkable.  No pelvic ascites.  No suspicious pelvic lymphadenopathy.  Status post PLIF at L4-5. Associated mild superior endplate fracture at L4 (series 206/ image 24), age indeterminate but new since 2014.  No evidence of sacrococcygeal fracture.  Status post right hip arthroplasty. No evidence of hardware loosening.  Mild sclerosis along the lateral aspect of the right acetabular roof/iliac bone (series 205/ image 81), without convincing linear fracture on axial images (series 201/image 72).  IMPRESSION: Status post right hip arthroplasty, without evidence of complication.  Mild sclerosis along the lateral aspect of the right acetabular roof/iliac bone, without convincing fracture.  No evidence of sacrococcygeal fracture.  Status post PLIF at L4-5. Associated mild superior endplate fracture at L4, age indeterminate but new since 2014.   Electronically Signed   By: Julian Hy M.D.   On: 11/20/2014 20:43     EKG: Independently  reviewed. Normal Sinus Rhythm Rate =95, No  Acute S-T Changes    Assessment/Plan:   80 y.o. female with  Principal Problem:   1.    Syncope   Telemetry Monitoring   Cycle Troponins   Check Orthostatic Vital Signs    2.    CAD   Telemetry Mont[place]   Continue ASA, Carvedilol and Lisinopril Rx     3.    HTN   Continue Carvedilol and Lisinopril Rx   Monitor BPs     4.   Hypokalemia   Replace K+   Check Magnesium      5.   DVT Prophylaxis    Lovenox          Code Status:     FULL CODE   Family Communication:   Family at Bedside     Disposition Plan:    Observation Status        Time spent:  [facility] Pager [phone]   If [place] Please Contact the Day Rounding Team MD for [facility]  If 7PM-7AM, Please Contact Night-Floor Coverage  [email or web address] Password [email or web address] 11/20/2014, 11:56 PM     ADDENDUM:   Patient was seen and examined on 11/20/2014

## 2014-11-20 NOTE — ED Notes (Signed)
Pt placed in a gown and hooked up to monitor with the 5 lead, BP cuff and pulse ox.

## 2014-11-20 NOTE — ED Provider Notes (Signed)
CSN: [id number]     Arrival date & time 11/20/14  1750 History   First MD Initiated Contact with Patient 11/20/14 1913     Chief Complaint  Patient presents with  . Fall     (Consider location/radiation/quality/duration/timing/severity/associated sxs/prior Treatment) HPI Patient reports she was grooming her dog when she suddenly fell and had syncopal event 3 PM today. Since the event she complains of bilateral hip pain left greater than right, left knee pain neck pain and right-sided headache. Pain is worse with movement or changing position. No treatment prior to coming here. No other associated symptoms. Pain is severe and nonradiating not improved by anything. Past Medical History  Diagnosis Date  . Takotsubo syndrome   . Other and unspecified hyperlipidemia   . Esophageal reflux   . Osteoarthrosis, unspecified whether generalized or localized, lower leg   . IBS (irritable bowel syndrome)   . Diverticular disease   . Shortness of breath     when my heart is not doing right  . Anemia   . Depression   . Coronary artery disease   . Blood transfusion     2005 maybe  . Headache(784.0)     takes Prilosec  . Hypertension   . Falls frequently   . Anxiety     confusion   Past Surgical History  Procedure Laterality Date  . Knee surgery    . Hysterotomy    . Breast reconstruction    . Implantable contact lens implantation    . Cataract extraction w/ intraocular lens  implant, bilateral    . Tonsillectomy      as child  . Back surgery    . Hip arthroplasty Right 06/10/2013    Procedure: ARTHROPLASTY MONOPOLAR HIP CEMENTED;  Surgeon: Marybelle Killings, MD;  Location: [facility];  Service: Orthopedics;  Laterality: Right;  . Joint replacement    . Fracture surgery    . Kyphoplasty N/A 11/05/2013    Procedure: Jone Baseman TWO;  Surgeon: Ophelia Charter, MD;  Location: [place] [facility];  Service: Neurosurgery;  Laterality: N/A;   No family history on file. History  Substance Use  Topics  . Smoking status: Never Smoker   . Smokeless tobacco: Never Used  . Alcohol Use: No   OB History    No data available     Review of Systems  Constitutional: Negative.   Respiratory: Negative.   Cardiovascular: Negative.   Gastrointestinal: Negative.   Musculoskeletal: Positive for back pain, arthralgias, gait problem and neck pain.       Chronic back pain. Walks with cane and or walker  Skin: Negative.   Neurological: Positive for headaches.  Psychiatric/Behavioral: Negative.   All other systems reviewed and are negative.     Allergies  Sulfamethoxazole-trimethoprim  Home Medications   Prior to Admission medications   Medication Sig Start Date End Date Taking? Authorizing Provider  ALPRAZolam Duanne Moron) 0.5 MG tablet Take 1 tablet (0.5 mg total) by mouth 3 (three) times daily as needed for anxiety or sleep. For anxiety. 06/22/13   Annita Brod, MD  aspirin (BAYER ASPIRIN) 325 MG tablet Take 1 tablet (325 mg total) by mouth daily. 06/10/13   Marybelle Killings, MD  aspirin EC 81 MG tablet Take 81 mg by mouth daily.    Historical Provider, MD  carvedilol (COREG) 6.25 MG tablet Take 6.25 mg by mouth 2 (two) times daily with a meal.     Historical Provider, MD  dicyclomine (BENTYL) 20 MG tablet  Take 20 mg by mouth 2 (two) times daily.     Historical Provider, MD  diphenoxylate-atropine (LOMOTIL) 2.5-0.025 MG per tablet Take 1 tablet by mouth 4 (four) times daily as needed for diarrhea or loose stools.  04/27/14   Historical Provider, MD  docusate sodium 100 MG CAPS Take 100 mg by mouth 2 (two) times daily. Patient taking differently: Take 100 mg by mouth daily.  11/06/13   Newman Pies, MD  escitalopram (LEXAPRO) 10 MG tablet Take 20 mg by mouth 2 (two) times daily.     Historical Provider, MD  HYDROcodone-acetaminophen (NORCO/VICODIN) 5-325 MG per tablet Take 1-2 tablets by mouth every 4 (four) hours as needed for moderate pain. 11/06/13   Newman Pies, MD  lisinopril  (PRINIVIL,ZESTRIL) 5 MG tablet Take 1 tablet (5 mg total) by mouth daily. 06/22/13   Annita Brod, MD  Multiple Vitamins-Minerals (MULTIVITAMINS THER. W/MINERALS) TABS Take 1 tablet by mouth daily.     Historical Provider, MD  nitroGLYCERIN (NITROSTAT) 0.4 MG SL tablet Place 1 tablet (0.4 mg total) under the tongue every 5 (five) minutes as needed. For chest pain. 04/22/13   Sherren Mocha, MD  omeprazole (PRILOSEC) 20 MG capsule Take 20 mg by mouth daily.      Historical Provider, MD  traMADol (ULTRAM) 50 MG tablet Take 1 tablet (50 mg total) by mouth every 6 (six) hours as needed for moderate pain. 06/14/13   Velvet Bathe, MD  trolamine salicylate (ASPERCREME) 10 % cream Apply 1 application topically as needed (for back pain).     Historical Provider, MD   BP 137/115 mmHg  Pulse 84  Temp(Src) 98 F (36.7 C) (Oral)  Resp 18  SpO2 98% Physical Exam  Constitutional: She is oriented to person, place, and time.  Chronically ill-appearing. Appears uncomfortable  HENT:  Head: Normocephalic and atraumatic.  Eyes: Conjunctivae are normal. Pupils are equal, round, and reactive to light.  Neck: Neck supple. No tracheal deviation present. No thyromegaly present.  Cardiovascular: Normal rate and regular rhythm.   No murmur heard. Pulmonary/Chest: Effort normal and breath sounds normal.  Abdominal: Soft. Bowel sounds are normal. She exhibits no distension. There is no tenderness.  Musculoskeletal: Normal range of motion. She exhibits no edema or tenderness.  Cervical spine diffusely tender. Pelvis stable. Tender at bilateral hips. Left lower extremity with pain on internal and external rotation of thigh. Knee is nontender. Thigh and shin nontender. DP pulse 2+ bilaterally. All other extremities no contusion abrasion or tenderness neurovascularly intact  Neurological: She is alert and oriented to person, place, and time. No cranial nerve deficit. She exhibits abnormal muscle tone. Coordination  normal.  Skin: Skin is warm and dry. No rash noted.  Psychiatric: She has a normal mood and affect.  Nursing note and vitals reviewed.   ED Course  Procedures (including critical care time) Labs Review Labs Reviewed - No data to display  Imaging Review No results found.   EKG Interpretation   Date/Time:  Saturday November 20 2014 19:43:17 EDT Ventricular Rate:  95 PR Interval:  198 QRS Duration: 83 QT Interval:  353 QTC Calculation: 444 R Axis:   38 Text Interpretation:  Sinus rhythm Borderline low voltage, extremity leads  Since last tracing rate slower Confirmed by Savanna Dooley  MD, Ulys Favia ([id number]) on  11/20/2014 7:58:51 PM     10:30 PM patient resting comfortably after treatment with intravenous morphine. Results for orders placed or performed during the hospital encounter of 11/20/14  CBC  Result  Value Ref Range   WBC 10.2 4.0 - 10.5 K/uL   RBC 3.59 (L) 3.87 - 5.11 MIL/uL   Hemoglobin 11.0 (L) 12.0 - 15.0 g/dL   HCT 32.9 (L) 36.0 - 46.0 %   MCV 91.6 78.0 - 100.0 fL   MCH 30.6 26.0 - 34.0 pg   MCHC 33.4 30.0 - 36.0 g/dL   RDW 13.3 11.5 - 15.5 %   Platelets 188 150 - 400 K/uL  Basic metabolic panel  Result Value Ref Range   Sodium 135 135 - 145 mmol/L   Potassium 3.2 (L) 3.5 - 5.1 mmol/L   Chloride 101 96 - 112 mmol/L   CO2 25 19 - 32 mmol/L   Glucose, Bld 104 (H) 70 - 99 mg/dL   BUN 9 6 - 23 mg/dL   Creatinine, Ser 0.80 0.50 - 1.10 mg/dL   Calcium 8.5 8.4 - 10.5 mg/dL   GFR calc non Af Amer 69 (L) >90 mL/min   GFR calc Af Amer 80 (L) >90 mL/min   Anion gap 9 5 - 15   Ct Head Wo Contrast  11/20/2014   CLINICAL DATA:  Patient fell in her bathroom and hit her head with, no loss of consciousness  EXAM: CT HEAD WITHOUT CONTRAST  CT CERVICAL SPINE WITHOUT CONTRAST  TECHNIQUE: Multidetector CT imaging of the head and cervical spine was performed following the standard protocol without intravenous contrast. Multiplanar CT image reconstructions of the cervical spine were also  generated.  COMPARISON:  06/16/2013  FINDINGS: CT HEAD FINDINGS  Moderate atrophy. No abnormal attenuation to suggest hemorrhage extra-axial fluid infarct or mass. Mild low attenuation in the deep white matter consistent with chronic small vessel ischemia. No skull fracture.  CT CERVICAL SPINE FINDINGS  Right apical pleural parenchymal scarring is stable. No evidence of cervical spine fracture. Normal anterior-posterior alignment. Degenerative disc disease and degenerative facet change throughout the spine particularly involving the right facet joints at C5, C6, and C7, with fusion at C5-6 on the right and near fusion at C6-7 as well as at C7-T1.  IMPRESSION: No acute intracranial abnormalities. No evidence of cervical spine fracture.   Electronically Signed   By: Skipper Cliche M.D.   On: 11/20/2014 20:39   Ct Cervical Spine Wo Contrast  11/20/2014   CLINICAL DATA:  Patient fell in her bathroom and hit her head with, no loss of consciousness  EXAM: CT HEAD WITHOUT CONTRAST  CT CERVICAL SPINE WITHOUT CONTRAST  TECHNIQUE: Multidetector CT imaging of the head and cervical spine was performed following the standard protocol without intravenous contrast. Multiplanar CT image reconstructions of the cervical spine were also generated.  COMPARISON:  06/16/2013  FINDINGS: CT HEAD FINDINGS  Moderate atrophy. No abnormal attenuation to suggest hemorrhage extra-axial fluid infarct or mass. Mild low attenuation in the deep white matter consistent with chronic small vessel ischemia. No skull fracture.  CT CERVICAL SPINE FINDINGS  Right apical pleural parenchymal scarring is stable. No evidence of cervical spine fracture. Normal anterior-posterior alignment. Degenerative disc disease and degenerative facet change throughout the spine particularly involving the right facet joints at C5, C6, and C7, with fusion at C5-6 on the right and near fusion at C6-7 as well as at C7-T1.  IMPRESSION: No acute intracranial abnormalities. No  evidence of cervical spine fracture.   Electronically Signed   By: Skipper Cliche M.D.   On: 11/20/2014 20:39   Ct Pelvis Wo Contrast  11/20/2014   CLINICAL DATA:  Fall, right hip/coccyx pain,  status post right hip replacement  EXAM: CT PELVIS WITHOUT CONTRAST  TECHNIQUE: Multidetector CT imaging of the pelvis was performed following the standard protocol without intravenous contrast.  COMPARISON:  CT abdomen pelvis dated 06/18/2013  FINDINGS: Status post hysterectomy.  No adnexal masses.  Bladder is partially obscured by streak artifact but otherwise unremarkable.  No pelvic ascites.  No suspicious pelvic lymphadenopathy.  Status post PLIF at L4-5. Associated mild superior endplate fracture at L4 (series 206/ image 76), age indeterminate but new since 2014.  No evidence of sacrococcygeal fracture.  Status post right hip arthroplasty. No evidence of hardware loosening.  Mild sclerosis along the lateral aspect of the right acetabular roof/iliac bone (series 205/ image 81), without convincing linear fracture on axial images (series 201/image 72).  IMPRESSION: Status post right hip arthroplasty, without evidence of complication.  Mild sclerosis along the lateral aspect of the right acetabular roof/iliac bone, without convincing fracture.  No evidence of sacrococcygeal fracture.  Status post PLIF at L4-5. Associated mild superior endplate fracture at L4, age indeterminate but new since 2014.   Electronically Signed   By: Julian Hy M.D.   On: 11/20/2014 20:43    MDM  I spoke with Dr. Arnoldo Morale plan 23 hour observation, telemetry. Final diagnoses:  None   diagnoses #1 syncope #2 contusions multiple sites #3 hypokalemia #4anemia    Orlie Dakin, MD 11/20/14 2312

## 2014-11-21 ENCOUNTER — Encounter (HOSPITAL_COMMUNITY): Payer: Self-pay | Admitting: Radiology

## 2014-11-21 ENCOUNTER — Observation Stay (HOSPITAL_COMMUNITY): Payer: Medicare Other

## 2014-11-21 DIAGNOSIS — W19XXXD Unspecified fall, subsequent encounter: Secondary | ICD-10-CM | POA: Diagnosis not present

## 2014-11-21 DIAGNOSIS — I2510 Atherosclerotic heart disease of native coronary artery without angina pectoris: Secondary | ICD-10-CM

## 2014-11-21 DIAGNOSIS — E876 Hypokalemia: Secondary | ICD-10-CM | POA: Diagnosis not present

## 2014-11-21 DIAGNOSIS — I10 Essential (primary) hypertension: Secondary | ICD-10-CM

## 2014-11-21 DIAGNOSIS — J9601 Acute respiratory failure with hypoxia: Secondary | ICD-10-CM | POA: Diagnosis present

## 2014-11-21 DIAGNOSIS — J9600 Acute respiratory failure, unspecified whether with hypoxia or hypercapnia: Secondary | ICD-10-CM

## 2014-11-21 DIAGNOSIS — R55 Syncope and collapse: Secondary | ICD-10-CM

## 2014-11-21 DIAGNOSIS — J9602 Acute respiratory failure with hypercapnia: Secondary | ICD-10-CM

## 2014-11-21 DIAGNOSIS — W19XXXA Unspecified fall, initial encounter: Secondary | ICD-10-CM | POA: Diagnosis present

## 2014-11-21 LAB — BASIC METABOLIC PANEL
Anion gap: 7 (ref 5–15)
BUN: 8 mg/dL (ref 6–20)
CALCIUM: 8.2 mg/dL — AB (ref 8.9–10.3)
CO2: 27 mmol/L (ref 22–32)
CREATININE: 0.85 mg/dL (ref 0.44–1.00)
Chloride: 103 mmol/L (ref 101–111)
GFR calc Af Amer: >60 mL/min (ref >60)
GFR calc non Af Amer: >60 mL/min (ref >60)
Glucose, Bld: 96 mg/dL (ref 70–99)
Potassium: 3.9 mmol/L (ref 3.5–5.1)
Sodium: 137 mmol/L (ref 135–145)

## 2014-11-21 LAB — TROPONIN I
Troponin I: <0.03 ng/mL (ref <0.031)
Troponin I: <0.03 ng/mL (ref <0.031)
Troponin I: <0.03 ng/mL (ref <0.031)

## 2014-11-21 LAB — CBC
HEMATOCRIT: 30.5 % — AB (ref 36.0–46.0)
Hemoglobin: 10.1 g/dL — ABNORMAL LOW (ref 12.0–15.0)
MCH: 30.3 pg (ref 26.0–34.0)
MCHC: 33.1 g/dL (ref 30.0–36.0)
MCV: 91.6 fL (ref 78.0–100.0)
Platelets: 178 10*3/uL (ref 150–400)
RBC: 3.33 MIL/uL — ABNORMAL LOW (ref 3.87–5.11)
RDW: 13.5 % (ref 11.5–15.5)
WBC: 7.9 10*3/uL (ref 4.0–10.5)

## 2014-11-21 LAB — MRSA PCR SCREENING: MRSA by PCR: POSITIVE — AB

## 2014-11-21 LAB — D-DIMER, QUANTITATIVE (NOT AT ARMC): D DIMER QUANT: 7.53 ug{FEU}/mL — AB (ref 0.00–0.48)

## 2014-11-21 LAB — HEPARIN LEVEL (UNFRACTIONATED): Heparin Unfractionated: 0.49 IU/mL (ref 0.30–0.70)

## 2014-11-21 LAB — TSH: TSH: 1.349 u[IU]/mL (ref 0.350–4.500)

## 2014-11-21 MED ORDER — PANTOPRAZOLE SODIUM 40 MG PO TBEC
40.0000 mg | DELAYED_RELEASE_TABLET | Freq: Every day | ORAL | Status: DC
  Administered 2014-11-21 – 2014-11-23 (×3): 40 mg via ORAL
  Filled 2014-11-21 (×3): qty 1

## 2014-11-21 MED ORDER — LISINOPRIL 5 MG PO TABS
5.0000 mg | ORAL_TABLET | Freq: Every day | ORAL | Status: DC
  Filled 2014-11-21: qty 1

## 2014-11-21 MED ORDER — IOHEXOL 350 MG/ML SOLN
80.0000 mL | Freq: Once | INTRAVENOUS | Status: AC | PRN
  Administered 2014-11-21: 80 mL via INTRAVENOUS

## 2014-11-21 MED ORDER — SODIUM CHLORIDE 0.9 % IV SOLN
INTRAVENOUS | Status: DC
  Administered 2014-11-21: 01:00:00 via INTRAVENOUS

## 2014-11-21 MED ORDER — HEPARIN (PORCINE) IN NACL 100-0.45 UNIT/ML-% IJ SOLN
900.0000 [IU]/h | INTRAMUSCULAR | Status: DC
  Administered 2014-11-21: 900 [IU]/h via INTRAVENOUS
  Filled 2014-11-21 (×2): qty 250

## 2014-11-21 MED ORDER — ACETAMINOPHEN 650 MG RE SUPP: 650.0000 mg | Freq: Four times a day (QID) | RECTAL | Status: DC | PRN

## 2014-11-21 MED ORDER — ONDANSETRON HCL 4 MG/2ML IJ SOLN: 4.0000 mg | Freq: Four times a day (QID) | INTRAMUSCULAR | Status: DC | PRN

## 2014-11-21 MED ORDER — MUPIROCIN 2 % EX OINT
1.0000 "application " | TOPICAL_OINTMENT | Freq: Two times a day (BID) | CUTANEOUS | Status: DC
  Administered 2014-11-21 – 2014-11-23 (×5): 1 via NASAL
  Filled 2014-11-21: qty 22

## 2014-11-21 MED ORDER — SODIUM CHLORIDE 0.9 % IJ SOLN
3.0000 mL | Freq: Two times a day (BID) | INTRAMUSCULAR | Status: DC
  Administered 2014-11-21 – 2014-11-23 (×3): 3 mL via INTRAVENOUS

## 2014-11-21 MED ORDER — ALUM & MAG HYDROXIDE-SIMETH 200-200-20 MG/5ML PO SUSP: 30.0000 mL | Freq: Four times a day (QID) | ORAL | Status: DC | PRN

## 2014-11-21 MED ORDER — ONDANSETRON HCL 4 MG PO TABS
4.0000 mg | ORAL_TABLET | Freq: Four times a day (QID) | ORAL | Status: DC | PRN
  Administered 2014-11-21: 4 mg via ORAL
  Filled 2014-11-21: qty 1

## 2014-11-21 MED ORDER — ENSURE ENLIVE PO LIQD
237.0000 mL | Freq: Two times a day (BID) | ORAL | Status: DC
  Administered 2014-11-21 – 2014-11-23 (×4): 237 mL via ORAL

## 2014-11-21 MED ORDER — PNEUMOCOCCAL VAC POLYVALENT 25 MCG/0.5ML IJ INJ
0.5000 mL | INJECTION | INTRAMUSCULAR | Status: AC
Start: 2014-11-22 — End: 2014-11-22
  Administered 2014-11-22: 0.5 mL via INTRAMUSCULAR
  Filled 2014-11-21: qty 0.5

## 2014-11-21 MED ORDER — HEPARIN BOLUS VIA INFUSION
2500.0000 [IU] | Freq: Once | INTRAVENOUS | Status: AC
  Administered 2014-11-21: 2500 [IU] via INTRAVENOUS
  Filled 2014-11-21: qty 2500

## 2014-11-21 MED ORDER — ACETAMINOPHEN 325 MG PO TABS: 650.0000 mg | ORAL_TABLET | Freq: Four times a day (QID) | ORAL | Status: DC | PRN

## 2014-11-21 MED ORDER — HYDROMORPHONE HCL 1 MG/ML IJ SOLN: 0.5000 mg | INTRAMUSCULAR | Status: DC | PRN

## 2014-11-21 MED ORDER — ENOXAPARIN SODIUM 30 MG/0.3ML ~~LOC~~ SOLN
30.0000 mg | SUBCUTANEOUS | Status: DC
  Administered 2014-11-21: 30 mg via SUBCUTANEOUS
  Filled 2014-11-21: qty 0.3

## 2014-11-21 MED ORDER — ESCITALOPRAM OXALATE 20 MG PO TABS
20.0000 mg | ORAL_TABLET | Freq: Two times a day (BID) | ORAL | Status: DC
Start: 2014-11-21 — End: 2014-11-23
  Administered 2014-11-21 – 2014-11-23 (×5): 20 mg via ORAL
  Filled 2014-11-21 (×6): qty 1

## 2014-11-21 MED ORDER — CARVEDILOL 6.25 MG PO TABS
6.2500 mg | ORAL_TABLET | Freq: Two times a day (BID) | ORAL | Status: DC
  Administered 2014-11-21 – 2014-11-23 (×6): 6.25 mg via ORAL
  Filled 2014-11-21 (×7): qty 1

## 2014-11-21 MED ORDER — NITROGLYCERIN 0.4 MG SL SUBL: 0.4000 mg | SUBLINGUAL_TABLET | SUBLINGUAL | Status: DC | PRN

## 2014-11-21 MED ORDER — OXYCODONE HCL 5 MG PO TABS
5.0000 mg | ORAL_TABLET | ORAL | Status: DC | PRN
Start: 2014-11-21 — End: 2014-11-23
  Administered 2014-11-21 (×3): 5 mg via ORAL
  Filled 2014-11-21 (×3): qty 1

## 2014-11-21 MED ORDER — CHLORHEXIDINE GLUCONATE CLOTH 2 % EX PADS
6.0000 | MEDICATED_PAD | Freq: Every day | CUTANEOUS | Status: DC
  Administered 2014-11-21 – 2014-11-22 (×2): 6 via TOPICAL

## 2014-11-21 NOTE — Progress Notes (Addendum)
D dimer came back elevated. Heparin gtt started.  CTA ordered. Nursing updated Eulogio Bear DO

## 2014-11-21 NOTE — Progress Notes (Signed)
Utilization review completed.  

## 2014-11-21 NOTE — Progress Notes (Signed)
ANTICOAGULATION CONSULT NOTE - Initial Consult  Pharmacy Consult for Heparin Indication: rule out PE, elevated D-dimer  Allergies  Allergen Reactions  . Sulfamethoxazole-Trimethoprim Hives    Patient Measurements: 52 kg  Vital Signs: Temp: 99.3 F (37.4 C) (05/01 1401) Temp Source: Oral (05/01 1401) BP: 108/68 mmHg (05/01 1409) Pulse Rate: 100 (05/01 1409)  Labs:  Recent Labs  11/20/14 2025 11/21/14 0051 11/21/14 0706 11/21/14 1137  HGB 11.0*  --  10.1*  --   HCT 32.9*  --  30.5*  --   PLT 188  --  178  --   CREATININE 0.80  --  0.85  --   TROPONINI  --  <0.03 <0.03 <0.03    CrCl cannot be calculated (Unknown ideal weight.).   Medical History: Past Medical History  Diagnosis Date  . Takotsubo syndrome   . Other and unspecified hyperlipidemia   . Esophageal reflux   . Osteoarthrosis, unspecified whether generalized or localized, lower leg   . IBS (irritable bowel syndrome)   . Diverticular disease   . Shortness of breath     when my heart is not doing right  . Anemia   . Depression   . Coronary artery disease   . Blood transfusion     2005 maybe  . Headache(784.0)     takes Prilosec  . Hypertension   . Falls frequently   . Anxiety     confusion    Assessment: 80 year old female who is being admitted after passing out.  D-Dimer is elevated, to start heparin for rule out PE Received dose of Lovenox 30 mg at 10 am  Goal of Therapy:  Heparin level 0.3-0.7 units/ml Monitor platelets by anticoagulation protocol: Yes   Plan:  Heparin 2500 units iv bolus x 1 Heparin drip at 900 units / hr Heparin level 8 hours after heparin starts Daily heparin level, CBC  Thank you. Anette Guarneri, PharmD [phone]  Tad Moore 11/21/2014,2:18 PM

## 2014-11-21 NOTE — Progress Notes (Addendum)
PROGRESS NOTE  [name] UUV:[id number] DOB: [birth year] DOA: 11/20/2014 PCP: Woody Seller, MD  Assessment/Plan: Syncope Telemetry Monitoring Cycle Troponins -check d dimer as patient does not wear O2 at home but has 3L here- if positive, will get CTA- does have pain with Inspiration-- will start with x ray   -echo   -orthos: laying: 105/61    Sitting: 125/68    Standing:106/73   -will continue IVF- recheck orthos q shift x 4  Acute resp failure- wean off O2 as tolerated  CAD Telemetry Montoring Continue ASA, Carvedilol and Lisinopril Rx   HTN Continue Carvedilol and Lisinopril Rx Monitor BPs   Hypokalemia Replace K+ Check Magnesium    Back pain   -K pad and PRN oxycodone   PT eval -daughter who is an Therapist, sports does not think patient is on BP meds- ask pharmacy to review meds- uses [facility] in [place]    Code Status: full Family Communication: patient/ called daughter- is an Therapist, sports Disposition Plan:    Consultants:    Procedures:      HPI/Subjective: Pain with deep breathing in back  Objective: Filed Vitals:   11/21/14 0456  BP: 106/73  Pulse:   Temp:   Resp:     Intake/Output Summary (Last 24 hours) at 11/21/14 0833 Last data filed at 11/21/14 0700  Gross per 24 hour  Intake 276.67 ml  Output      0 ml  Net 276.67 ml   There were no vitals filed for this visit.  Exam:   General:  Pleasant/cooperative  Cardiovascular: rrr  Respiratory: no wheezing  Abdomen: +BS, soft  Musculoskeletal: no edema  Data Reviewed: Basic Metabolic Panel:  Recent Labs Lab 11/20/14 2025 11/21/14 0706  NA 135 137  K 3.2* 3.9  CL 101 103  CO2 25 27  GLUCOSE 104* 96  BUN 9 8  CREATININE 0.80 0.85  CALCIUM 8.5 8.2*   Liver Function  Tests: No results for input(s): AST, ALT, ALKPHOS, BILITOT, PROT, ALBUMIN in the last 168 hours. No results for input(s): LIPASE, AMYLASE in the last 168 hours. No results for input(s): AMMONIA in the last 168 hours. CBC:  Recent Labs Lab 11/20/14 2025 11/21/14 0706  WBC 10.2 7.9  HGB 11.0* 10.1*  HCT 32.9* 30.5*  MCV 91.6 91.6  PLT 188 178   Cardiac Enzymes:  Recent Labs Lab 11/21/14 0051 11/21/14 0706  TROPONINI <0.03 <0.03   BNP (last 3 results) No results for input(s): BNP in the last 8760 hours.  ProBNP (last 3 results) No results for input(s): PROBNP in the last 8760 hours.  CBG: No results for input(s): GLUCAP in the last 168 hours.  Recent Results (from the past 240 hour(s))  MRSA PCR Screening     Status: Abnormal   Collection Time: 11/21/14  2:06 AM  Result Value Ref Range Status   MRSA by PCR POSITIVE (A) NEGATIVE Final    Comment:        The GeneXpert MRSA Assay (FDA approved for NASAL specimens only), is one component of a comprehensive MRSA colonization surveillance program. It is not intended to diagnose MRSA infection nor to guide or monitor treatment for MRSA infections. RESULT CALLED TO, READ BACK BY AND VERIFIED WITH: GRAHAM,M RN [phone] 11/21/14 MITCHELL,L      Studies: Ct Head Wo Contrast  11/20/2014   CLINICAL DATA:  Patient fell in her bathroom and hit her head with, no loss of consciousness  EXAM: CT HEAD WITHOUT CONTRAST  CT CERVICAL SPINE  WITHOUT CONTRAST  TECHNIQUE: Multidetector CT imaging of the head and cervical spine was performed following the standard protocol without intravenous contrast. Multiplanar CT image reconstructions of the cervical spine were also generated.  COMPARISON:  06/16/2013  FINDINGS: CT HEAD FINDINGS  Moderate atrophy. No abnormal attenuation to suggest hemorrhage extra-axial fluid infarct or mass. Mild low attenuation in the deep white matter consistent with chronic small vessel ischemia. No skull fracture.  CT  CERVICAL SPINE FINDINGS  Right apical pleural parenchymal scarring is stable. No evidence of cervical spine fracture. Normal anterior-posterior alignment. Degenerative disc disease and degenerative facet change throughout the spine particularly involving the right facet joints at C5, C6, and C7, with fusion at C5-6 on the right and near fusion at C6-7 as well as at C7-T1.  IMPRESSION: No acute intracranial abnormalities. No evidence of cervical spine fracture.   Electronically Signed   By: Skipper Cliche M.D.   On: 11/20/2014 20:39   Ct Cervical Spine Wo Contrast  11/20/2014   CLINICAL DATA:  Patient fell in her bathroom and hit her head with, no loss of consciousness  EXAM: CT HEAD WITHOUT CONTRAST  CT CERVICAL SPINE WITHOUT CONTRAST  TECHNIQUE: Multidetector CT imaging of the head and cervical spine was performed following the standard protocol without intravenous contrast. Multiplanar CT image reconstructions of the cervical spine were also generated.  COMPARISON:  06/16/2013  FINDINGS: CT HEAD FINDINGS  Moderate atrophy. No abnormal attenuation to suggest hemorrhage extra-axial fluid infarct or mass. Mild low attenuation in the deep white matter consistent with chronic small vessel ischemia. No skull fracture.  CT CERVICAL SPINE FINDINGS  Right apical pleural parenchymal scarring is stable. No evidence of cervical spine fracture. Normal anterior-posterior alignment. Degenerative disc disease and degenerative facet change throughout the spine particularly involving the right facet joints at C5, C6, and C7, with fusion at C5-6 on the right and near fusion at C6-7 as well as at C7-T1.  IMPRESSION: No acute intracranial abnormalities. No evidence of cervical spine fracture.   Electronically Signed   By: Skipper Cliche M.D.   On: 11/20/2014 20:39   Ct Pelvis Wo Contrast  11/20/2014   CLINICAL DATA:  Fall, right hip/coccyx pain, status post right hip replacement  EXAM: CT PELVIS WITHOUT CONTRAST  TECHNIQUE:  Multidetector CT imaging of the pelvis was performed following the standard protocol without intravenous contrast.  COMPARISON:  CT abdomen pelvis dated 06/18/2013  FINDINGS: Status post hysterectomy.  No adnexal masses.  Bladder is partially obscured by streak artifact but otherwise unremarkable.  No pelvic ascites.  No suspicious pelvic lymphadenopathy.  Status post PLIF at L4-5. Associated mild superior endplate fracture at L4 (series 206/ image 44), age indeterminate but new since 2014.  No evidence of sacrococcygeal fracture.  Status post right hip arthroplasty. No evidence of hardware loosening.  Mild sclerosis along the lateral aspect of the right acetabular roof/iliac bone (series 205/ image 81), without convincing linear fracture on axial images (series 201/image 72).  IMPRESSION: Status post right hip arthroplasty, without evidence of complication.  Mild sclerosis along the lateral aspect of the right acetabular roof/iliac bone, without convincing fracture.  No evidence of sacrococcygeal fracture.  Status post PLIF at L4-5. Associated mild superior endplate fracture at L4, age indeterminate but new since 2014.   Electronically Signed   By: Julian Hy M.D.   On: 11/20/2014 20:43    Scheduled Meds: . carvedilol  6.25 mg Oral BID WC  . Chlorhexidine Gluconate Cloth  6 each  Topical Q0600  . enoxaparin (LOVENOX) injection  30 mg Subcutaneous Q24H  . escitalopram  20 mg Oral BID  . feeding supplement (ENSURE ENLIVE)  237 mL Oral BID BM  . lisinopril  5 mg Oral Daily  . mupirocin ointment  1 application Nasal BID  . pantoprazole  40 mg Oral Daily  . sodium chloride  3 mL Intravenous Q12H   Continuous Infusions: . sodium chloride 50 mL/hr at 11/21/14 0128   Antibiotics Given (last 72 hours)    None      Principal Problem:   Syncope Active Problems:   Hypokalemia   Essential hypertension   CAD (coronary artery disease)    Time spent: 25 min    VANN, JESSICA  [facility] Pager [phone]. If 7PM-7AM, please contact night-coverage at [email or web address], password [facility] 11/21/2014, 8:33 AM

## 2014-11-21 NOTE — Progress Notes (Signed)
Incentive spirometer given to patient with instruction on its use. However, pt has poor short term memory of details (which is baseline according to daughter). I do not feel confident she will use it properly or consistently.

## 2014-11-21 NOTE — Progress Notes (Signed)
ANTICOAGULATION CONSULT NOTE - Follow Up Consult  Pharmacy Consult for Heparin  Indication: Rule Out VTE/elevated D-Dimer  Allergies  Allergen Reactions  . Sulfamethoxazole-Trimethoprim Hives    Patient Measurements: Height: 5\' 2"  (157.5 cm) Weight: 108 lb 7.5 oz (49.2 kg) IBW/kg (Calculated) : 50.1 Vital Signs: Temp: 99.7 F (37.6 C) (05/01 2037) Temp Source: Oral (05/01 2037) BP: 108/80 mmHg (05/01 2037) Pulse Rate: 80 (05/01 2037)  Labs:  Recent Labs  11/20/14 2025 11/21/14 0051 11/21/14 0706 11/21/14 1137 11/21/14 2240  HGB 11.0*  --  10.1*  --   --   HCT 32.9*  --  30.5*  --   --   PLT 188  --  178  --   --   HEPARINUNFRC  --   --   --   --  0.49  CREATININE 0.80  --  0.85  --   --   TROPONINI  --  <0.03 <0.03 <0.03  --     Estimated Creatinine Clearance: 43.1 mL/min (by C-G formula based on Cr of 0.85).    Assessment: Therapeutic heparin level x 1, CT angio is negative for PE, ?checking dopplers  Goal of Therapy:  Heparin level 0.3-0.7 units/ml Monitor platelets by anticoagulation protocol: Yes   Plan:  -Continue heparin at 900 units/hr -HL with AM labs -Daily CBC/HL -Monitor for bleeding -F/U VTE work-up, CT Angio is negative for PE  Narda Bonds 11/21/2014,11:27 PM

## 2014-11-22 ENCOUNTER — Observation Stay (HOSPITAL_COMMUNITY): Payer: Medicare Other

## 2014-11-22 ENCOUNTER — Observation Stay (HOSPITAL_BASED_OUTPATIENT_CLINIC_OR_DEPARTMENT_OTHER): Payer: Medicare Other

## 2014-11-22 DIAGNOSIS — R7989 Other specified abnormal findings of blood chemistry: Secondary | ICD-10-CM | POA: Diagnosis present

## 2014-11-22 DIAGNOSIS — R791 Abnormal coagulation profile: Secondary | ICD-10-CM

## 2014-11-22 DIAGNOSIS — K529 Noninfective gastroenteritis and colitis, unspecified: Secondary | ICD-10-CM | POA: Diagnosis present

## 2014-11-22 DIAGNOSIS — F411 Generalized anxiety disorder: Secondary | ICD-10-CM | POA: Diagnosis present

## 2014-11-22 DIAGNOSIS — R55 Syncope and collapse: Secondary | ICD-10-CM | POA: Diagnosis not present

## 2014-11-22 LAB — CBC
HCT: 29.5 % — ABNORMAL LOW (ref 36.0–46.0)
HEMOGLOBIN: 9.9 g/dL — AB (ref 12.0–15.0)
MCH: 30.4 pg (ref 26.0–34.0)
MCHC: 33.6 g/dL (ref 30.0–36.0)
MCV: 90.5 fL (ref 78.0–100.0)
Platelets: 168 10*3/uL (ref 150–400)
RBC: 3.26 MIL/uL — AB (ref 3.87–5.11)
RDW: 13.2 % (ref 11.5–15.5)
WBC: 7.6 10*3/uL (ref 4.0–10.5)

## 2014-11-22 LAB — BASIC METABOLIC PANEL WITH GFR
Anion gap: 5 (ref 5–15)
BUN: 5 mg/dL — ABNORMAL LOW (ref 6–20)
CO2: 29 mmol/L (ref 22–32)
Calcium: 8.1 mg/dL — ABNORMAL LOW (ref 8.9–10.3)
Chloride: 105 mmol/L (ref 101–111)
Creatinine, Ser: 0.78 mg/dL (ref 0.44–1.00)
GFR calc Af Amer: >60 mL/min
GFR calc non Af Amer: >60 mL/min
Glucose, Bld: 105 mg/dL — ABNORMAL HIGH (ref 70–99)
Potassium: 3.7 mmol/L (ref 3.5–5.1)
Sodium: 139 mmol/L (ref 135–145)

## 2014-11-22 LAB — HEPARIN LEVEL (UNFRACTIONATED): HEPARIN UNFRACTIONATED: 0.62 [IU]/mL (ref 0.30–0.70)

## 2014-11-22 MED ORDER — ALPRAZOLAM 0.5 MG PO TABS: 0.5000 mg | ORAL_TABLET | Freq: Three times a day (TID) | ORAL | Status: DC | PRN

## 2014-11-22 MED ORDER — TROLAMINE SALICYLATE 10 % EX CREA
1.0000 "application " | TOPICAL_CREAM | CUTANEOUS | Status: DC | PRN
  Filled 2014-11-22: qty 85

## 2014-11-22 MED ORDER — ALPRAZOLAM 0.5 MG PO TABS
0.5000 mg | ORAL_TABLET | Freq: Once | ORAL | Status: AC
  Administered 2014-11-22: 0.5 mg via ORAL
  Filled 2014-11-22: qty 1

## 2014-11-22 MED ORDER — MUSCLE RUB 10-15 % EX CREA
TOPICAL_CREAM | CUTANEOUS | Status: DC | PRN
  Filled 2014-11-22: qty 85

## 2014-11-22 MED ORDER — DICYCLOMINE HCL 20 MG PO TABS
20.0000 mg | ORAL_TABLET | Freq: Two times a day (BID) | ORAL | Status: DC
  Filled 2014-11-22: qty 1

## 2014-11-22 MED ORDER — ACETAMINOPHEN 325 MG PO TABS
650.0000 mg | ORAL_TABLET | Freq: Three times a day (TID) | ORAL | Status: DC
  Administered 2014-11-22 – 2014-11-23 (×4): 650 mg via ORAL
  Filled 2014-11-22 (×4): qty 2

## 2014-11-22 MED ORDER — ENOXAPARIN SODIUM 40 MG/0.4ML ~~LOC~~ SOLN
40.0000 mg | SUBCUTANEOUS | Status: DC
  Administered 2014-11-22: 40 mg via SUBCUTANEOUS
  Filled 2014-11-22 (×2): qty 0.4

## 2014-11-22 NOTE — Evaluation (Signed)
Physical Therapy Evaluation Patient Details Name: [name] MRN: [id number] DOB: Dec 06, 1934 Today's Date: 11/22/2014   History of Present Illness  [name] is a 80 y.o. female with a history of Takotsubo Syndrome, CAD, HTN. Hyperlipidemia, and IBS who presents to the ED with complaints of passing out. She reports that she was grooming her dog in her kitchen and she must have passed out and as she fell she hit head on the sink. D-dimer elevated, pt being evaluated for PE. At baseline, has memory loss.  Clinical Impression  Pt admitted with above diagnosis. Pt currently with functional limitations due to the deficits listed below (see PT Problem List). Pt ambulated 80' with min A, dysmetria noted LLE, ataxic with ambulation. Recommend comprehensive balance program and supervision for OOB mobility.  Pt will benefit from skilled PT to increase their independence and safety with mobility to allow discharge to the venue listed below.      Follow Up Recommendations Outpatient PT;Supervision for mobility/OOB    Equipment Recommendations  None recommended by PT    Recommendations for Other Services OT consult     Precautions / Restrictions Precautions Precautions: Fall Precaution Comments: has had multiple falls with injuries Restrictions Weight Bearing Restrictions: No      Mobility  Bed Mobility Overal bed mobility: Modified Independent             General bed mobility comments: pt able to get to EOB without physical assist but needs increased time  Transfers Overall transfer level: Needs assistance Equipment used: Rolling walker (2 wheeled) Transfers: Sit to/from Stand Sit to Stand: Min guard         General transfer comment: min-guard due to pt unsteady from bed and toilet. Use of grab bar from toilet.   Ambulation/Gait Ambulation/Gait assistance: Min assist Ambulation Distance (Feet): 80 Feet Assistive device: Rolling walker (2 wheeled) Gait Pattern/deviations:  Staggering left;Decreased weight shift to left;Decreased stride length;Decreased stance time - left;Narrow base of support;Ataxic Gait velocity: decreased Gait velocity interpretation: <1.8 ft/sec, indicative of risk for recurrent falls General Gait Details: left knee flexed throughout gait with decreased quad control, ataxia noted LLE, decreased body proprioception  Stairs            Wheelchair Mobility    Modified Rankin (Stroke Patients Only)       Balance Overall balance assessment: Needs assistance Sitting-balance support: No upper extremity supported;Feet supported Sitting balance-Leahy Scale: Good     Standing balance support: No upper extremity supported;During functional activity Standing balance-Leahy Scale: Fair Standing balance comment: pt able to brush teeth and pull up undergarments in standing without UE support, cannot accept mod challenge especially to left and cannot reach out of BOS to right or left                             Pertinent Vitals/Pain Pain Assessment: No/denies pain  O2 sats 95% on RA, O2 left off, RN notiified    Home Living Family/patient expects to be discharged to:: Private residence Living Arrangements: Spouse/significant other Available Help at Discharge: Family;Available 24 hours/day Type of Home: House Home Access: Level entry     Home Layout: One level Home Equipment: Walker - 2 wheels;Walker - 4 wheels;Shower seat;Bedside commode;Cane - single point Additional Comments: pt reports that her husband's mobility is not good either so though she has 24 hr supervision, doubt that he can help her. She reports that she has a girl that comes  in and cleans and does laundry for her. Questionable historian though as well    Prior Function Level of Independence: Independent with assistive device(s)         Comments: does not drive, though husband does     Hand Dominance        Extremity/Trunk Assessment   Upper  Extremity Assessment: Defer to OT evaluation           Lower Extremity Assessment: RLE deficits/detail;LLE deficits/detail;Generalized weakness RLE Deficits / Details: hip flex 4/5, knee flex 4/5, knee ext 4/5. mild tremor noted LLE Deficits / Details: hip flex 3/5, knee ext 2+/5, lacking full ROM, knee flex 3/5, resting and intentional tremor more noticeable than on right. Dysmetria noted.  Cervical / Trunk Assessment: Kyphotic  Communication   Communication: No difficulties  Cognition Arousal/Alertness: Awake/alert Behavior During Therapy: WFL for tasks assessed/performed Overall Cognitive Status: History of cognitive impairments - at baseline       Memory: Decreased short-term memory              General Comments General comments (skin integrity, edema, etc.): pt reports she thinks something has happened in her head that has caused her balance to be off for quite some time but she's not sure what it is    Exercises        Assessment/Plan    PT Assessment Patient needs continued PT services  PT Diagnosis Abnormality of gait;Generalized weakness   PT Problem List Decreased strength;Decreased range of motion;Decreased activity tolerance;Decreased balance;Decreased mobility;Decreased coordination;Decreased cognition;Decreased knowledge of use of DME;Decreased safety awareness;Decreased knowledge of precautions  PT Treatment Interventions DME instruction;Gait training;Functional mobility training;Therapeutic activities;Therapeutic exercise;Balance training;Neuromuscular re-education;Cognitive remediation;Patient/family education   PT Goals (Current goals can be found in the Care Plan section) Acute Rehab PT Goals Patient Stated Goal: return home to her dog, stop falling PT Goal Formulation: With patient Time For Goal Achievement: 12/06/14 Potential to Achieve Goals: Good    Frequency Min 3X/week   Barriers to discharge Decreased caregiver support       Co-evaluation               End of Session Equipment Utilized During Treatment: Gait belt Activity Tolerance: Patient tolerated treatment well Patient left: in chair;with chair alarm set;with call bell/phone within reach Nurse Communication: Mobility status    Functional Assessment Tool Used: clinical judgement Functional Limitation: Mobility: Walking and moving around Mobility: Walking and Moving Around Current Status (O1157): At least 20 percent but less than 40 percent impaired, limited or restricted Mobility: Walking and Moving Around Goal Status [phone]): At least 1 percent but less than 20 percent impaired, limited or restricted    Time: 1340-1427 PT Time Calculation (min) (ACUTE ONLY): 47 min   Charges:   PT Evaluation $Initial PT Evaluation Tier I: 1 Procedure PT Treatments $Gait Training: 8-22 mins $Therapeutic Activity: 8-22 mins   PT G Codes:   PT G-Codes **NOT FOR INPATIENT CLASS** Functional Assessment Tool Used: clinical judgement Functional Limitation: Mobility: Walking and moving around Mobility: Walking and Moving Around Current Status (T5974): At least 20 percent but less than 40 percent impaired, limited or restricted Mobility: Walking and Moving Around Goal Status [phone]): At least 1 percent but less than 20 percent impaired, limited or restricted   Leighton Roach, PT  Acute Rehab Services  [phone]  Leighton Roach 11/22/2014, 2:39 PM

## 2014-11-22 NOTE — Progress Notes (Signed)
Initial Nutrition Assessment  DOCUMENTATION CODES:  Severe malnutrition in context of chronic illness  INTERVENTION:  Ensure Enlive (each supplement provides 350kcal and 20 grams of protein)  NUTRITION DIAGNOSIS:  Malnutrition related to other (see comment) (decreased appetite, recent loss/grief) as evidenced by severe depletion of body fat, severe depletion of muscle mass.   GOAL:  Patient will meet greater than or equal to 90% of their needs   MONITOR:  PO intake, Supplement acceptance, Labs, Weight trends, I & O's, Skin  REASON FOR ASSESSMENT:  Malnutrition Screening Tool    ASSESSMENT: [name] is a 80 y.o. female with a history of Takotsubo Syndrome, CAD, HTN. Hyperlipidemia, and IBS who presents to the ED with complaints of passing out. She reports that she was grooming her dog in her kitchen and she must have passed out and as she fell she hit head on the sink. Her husband heard her fall and came found her on the Floor, when she came around she was hurting all over. She denies any prodrome, and denies and chest pain. She was referred for further evaluation of syncope.  Pt admitted with syncope.  Pt reports decreased appetite and weight loss over the past 4 years. She is unable to pin point when her appetite and weight loss started, but reveals she eats very little due to lack of appetite. She is easily distracted and required frequent re-direction to obtain history. She reveals that her older sister passed away from dementia approximately one year ago and pt became very tearful when talking about this; suspect that grief may be contributing to declining nutritional status.  Documented wt hx reveals UBW around 125#, which pt has not weighed over the past several years. No weight changes within the past year are significant for time frame.  Meal completion is 50% per doc flowsheets. Pt had two bottles of Ensure on bedside; one was about 25% consumed and the other was  unopened. Pt reports that she likes the supplements. Educated pt on importance of good meal and supplement intake to promote healing. Strongly encouraged pt to consume her supplements.  Labs reviewed. Calcium: 8.5, Glucose: 115.   Height:  Ht Readings from Last 1 Encounters:  11/21/14 5\' 2"  (1.575 m)    Weight:  Wt Readings from Last 1 Encounters:  11/21/14 108 lb 7.5 oz (49.2 kg)    Ideal Body Weight:  50 kg  Wt Readings from Last 10 Encounters:  11/21/14 108 lb 7.5 oz (49.2 kg)  08/12/14 114 lb (51.71 kg)  05/13/14 117 lb 12.8 oz (53.434 kg)  11/05/13 112 lb (50.803 kg)  06/09/13 128 lb 15.5 oz (58.5 kg)  04/22/13 112 lb (50.803 kg)  08/08/11 125 lb 6.4 oz (56.881 kg)  10/24/10 121 lb (54.885 kg)  10/16/10 124 lb (56.246 kg)  10/04/09 129 lb 8 oz (58.741 kg)    BMI:  Body mass index is 19.83 kg/(m^2).  Estimated Nutritional Needs:  Kcal:  1450-1650  Protein:  60-70 grams  Fluid:  1.5-1.7 L  Skin:  Reviewed, no issues  Diet Order:   NPO  EDUCATION NEEDS:  Education needs addressed   Intake/Output Summary (Last 24 hours) at 11/22/14 1622 Last data filed at 11/22/14 1452  Gross per 24 hour  Intake 977.75 ml  Output   1050 ml  Net -72.25 ml    Last BM:  11/21/14  Talaya Lamprecht A. Jimmye Norman, RD, LDN, CDE Pager: [phone] After hours Pager: [phone]

## 2014-11-22 NOTE — Progress Notes (Signed)
UR completed 

## 2014-11-22 NOTE — Progress Notes (Signed)
*  PRELIMINARY RESULTS* Vascular Ultrasound Lower extremity venous duplex has been completed.  Preliminary findings: Negative for DVT.   Landry Mellow, RDMS, RVT   11/22/2014, 11:31 AM

## 2014-11-22 NOTE — Care Management Note (Addendum)
    Page 1 of 2   11/23/2014     12:10:19 PM CARE MANAGEMENT NOTE 11/23/2014  Patient:  [name],[name]   Account Number:  [id number]  Date Initiated:  11/22/2014  Documentation initiated by:  Tomi Bamberger  Subjective/Objective Assessment:   dx syncope  admit as observation- lives with spouse.     Action/Plan:   pt eval- out pt physical therapy.   Anticipated DC Date:  11/23/2014   Anticipated DC Plan:  [place]  CM consult      [facility] Choice  HOME HEALTH   Choice offered to / List presented to:  [name]        Center arranged  [facility]      [place].   Status of service:  Completed, signed off Medicare Important Message given?  NO (If response is "NO", the following Medicare IM given date fields will be blank) Date Medicare IM given:   Medicare IM given by:   Date Additional Medicare IM given:   Additional Medicare IM given by:    Discharge Disposition:  [place]  Per UR Regulation:  Reviewed for med. necessity/level of care/duration of stay  If discussed at [place] of Stay Meetings, dates discussed:    Comments:  11/23/14 [place], BSN [phone] patient is for dc today, she states she would like to have hhpt insteade of outpatient pt, she would like to work with [facility], referral made to [facility] [place] notified.  Soc will begin 24-48 hrs post dc.  Patient is homebound.  11/22/14 [place], BSN [phone] patient is from home , per physcial therapy rec out pt physical therapy.  NCM will cont to follow for dc needs.

## 2014-11-22 NOTE — Progress Notes (Signed)
PROGRESS NOTE  [name] VCB:[id number] DOB: [birth year] DOA: 11/20/2014 PCP: Woody Seller, MD  Assessment/Plan: Syncope CTA negative. Doppler legs negative. sats fine on RA. Pt reports trying to wean off/stop xanax PTA. May have had withdrawal seizure.  Resume xanax. Echo pending. Home tomorrow if echo ok  Acute resp failure- resolved  CAD stable  HTN stable   Hypokalemia Replace K+ Check Magnesium    Back pain   -K pad and PRN oxycodone   PT eval -daughter who is an Therapist, sports does not think patient is on BP meds- ask pharmacy to review meds- uses [facility] in [place]    Code Status: full Family Communication: patient/ called daughter- is an Therapist, sports Disposition Plan:    Consultants:    Procedures:      HPI/Subjective: Feels shaky. Tried weaning off/stopping xanax PTA. Denies dyspnea.  Daughter reports pt has been more confused recently Objective: Filed Vitals:   11/22/14 1442  BP: 127/76  Pulse: 80  Temp: 98.8 F (37.1 C)  Resp: 16    Intake/Output Summary (Last 24 hours) at 11/22/14 1627 Last data filed at 11/22/14 1452  Gross per 24 hour  Intake 977.75 ml  Output   1050 ml  Net -72.25 ml   Filed Weights   11/21/14 1401  Weight: 49.2 kg (108 lb 7.5 oz)    Exam:   General:  Pleasant/cooperative. forgetful  Cardiovascular: rrr without MGR  Respiratory: CTA no wheezing rhonchi or rales  Abdomen: +BS, soft, NT  Musculoskeletal: no edema. Tremulous.  Data Reviewed: Basic Metabolic Panel:  Recent Labs Lab 11/20/14 2025 11/21/14 0706 11/22/14 0540  NA 135 137 139  K 3.2* 3.9 3.7  CL 101 103 105  CO2 25 27 29   GLUCOSE 104* 96 105*  BUN 9 8 5*  CREATININE 0.80 0.85 0.78  CALCIUM 8.5 8.2* 8.1*   Liver Function Tests: No results for input(s): AST, ALT, ALKPHOS, BILITOT, PROT, ALBUMIN in the last 168 hours. No results for input(s): LIPASE, AMYLASE in the last 168 hours. No results for  input(s): AMMONIA in the last 168 hours. CBC:  Recent Labs Lab 11/20/14 2025 11/21/14 0706 11/22/14 0540  WBC 10.2 7.9 7.6  HGB 11.0* 10.1* 9.9*  HCT 32.9* 30.5* 29.5*  MCV 91.6 91.6 90.5  PLT 188 178 168   Cardiac Enzymes:  Recent Labs Lab 11/21/14 0051 11/21/14 0706 11/21/14 1137  TROPONINI <0.03 <0.03 <0.03   BNP (last 3 results) No results for input(s): BNP in the last 8760 hours.  ProBNP (last 3 results) No results for input(s): PROBNP in the last 8760 hours.  CBG: No results for input(s): GLUCAP in the last 168 hours.  Recent Results (from the past 240 hour(s))  MRSA PCR Screening     Status: Abnormal   Collection Time: 11/21/14  2:06 AM  Result Value Ref Range Status   MRSA by PCR POSITIVE (A) NEGATIVE Final    Comment:        The GeneXpert MRSA Assay (FDA approved for NASAL specimens only), is one component of a comprehensive MRSA colonization surveillance program. It is not intended to diagnose MRSA infection nor to guide or monitor treatment for MRSA infections. RESULT CALLED TO, READ BACK BY AND VERIFIED WITH: GRAHAM,M RN [phone] 11/21/14 MITCHELL,L      Studies: Ct Head Wo Contrast  11/20/2014   CLINICAL DATA:  Patient fell in her bathroom and hit her head with, no loss of consciousness  EXAM: CT HEAD WITHOUT CONTRAST  CT CERVICAL SPINE  WITHOUT CONTRAST  TECHNIQUE: Multidetector CT imaging of the head and cervical spine was performed following the standard protocol without intravenous contrast. Multiplanar CT image reconstructions of the cervical spine were also generated.  COMPARISON:  06/16/2013  FINDINGS: CT HEAD FINDINGS  Moderate atrophy. No abnormal attenuation to suggest hemorrhage extra-axial fluid infarct or mass. Mild low attenuation in the deep white matter consistent with chronic small vessel ischemia. No skull fracture.  CT CERVICAL SPINE FINDINGS  Right apical pleural parenchymal scarring is stable. No evidence of cervical spine fracture.  Normal anterior-posterior alignment. Degenerative disc disease and degenerative facet change throughout the spine particularly involving the right facet joints at C5, C6, and C7, with fusion at C5-6 on the right and near fusion at C6-7 as well as at C7-T1.  IMPRESSION: No acute intracranial abnormalities. No evidence of cervical spine fracture.   Electronically Signed   By: Skipper Cliche M.D.   On: 11/20/2014 20:39   Ct Angio Chest Pe W/cm &/or Wo Cm  11/21/2014   CLINICAL DATA:  Elevated D-dimer levels with shortness of breath. Extreme right hip pain. History of skiing cancer. Evaluate for pulmonary embolism. Initial encounter.  EXAM: CT ANGIOGRAPHY CHEST WITH CONTRAST  TECHNIQUE: Multidetector CT imaging of the chest was performed using the standard protocol during bolus administration of intravenous contrast. Multiplanar CT image reconstructions and MIPs were obtained to evaluate the vascular anatomy.  CONTRAST:  37mL OMNIPAQUE IOHEXOL 350 MG/ML SOLN  COMPARISON:  Radiographs 11/21/2014 and 09/28/2014.  CT 03/16/2006.  FINDINGS: Mediastinum: The pulmonary arteries are well opacified with contrast. There is no evidence of acute pulmonary embolism. There is atherosclerosis of the aorta, great vessels and coronary arteries.The heart size is normal. There is a small amount of pericardial fluid. There are no enlarged mediastinal, hilar or axillary lymph nodes. The thyroid gland, trachea and esophagus demonstrate no significant findings.  Lungs/Pleura: There is no significant pleural effusion. There is dependent atelectasis at both lung bases. Mild right apical scarring appears stable. There is no confluent airspace opacity or suspicious pulmonary nodule.  Upper abdomen: Unremarkable.  There is no adrenal mass.  Musculoskeletal/Chest wall: Peripherally calcified bilateral breast implants noted. Interval development of multiple thoracic compression deformities, most prominent at T9 and T12. These do not appear  significantly progressive compared with more recent thoracic spine radiographs done last month.  Review of the MIP images confirms the above findings.  IMPRESSION: 1. No evidence of acute pulmonary embolism. 2. Dependent bibasilar atelectasis. 3. Multiple thoracic compression deformities, grossly stable from recent radiographs.   Electronically Signed   By: Richardean Sale M.D.   On: 11/21/2014 17:01   Ct Cervical Spine Wo Contrast  11/20/2014   CLINICAL DATA:  Patient fell in her bathroom and hit her head with, no loss of consciousness  EXAM: CT HEAD WITHOUT CONTRAST  CT CERVICAL SPINE WITHOUT CONTRAST  TECHNIQUE: Multidetector CT imaging of the head and cervical spine was performed following the standard protocol without intravenous contrast. Multiplanar CT image reconstructions of the cervical spine were also generated.  COMPARISON:  06/16/2013  FINDINGS: CT HEAD FINDINGS  Moderate atrophy. No abnormal attenuation to suggest hemorrhage extra-axial fluid infarct or mass. Mild low attenuation in the deep white matter consistent with chronic small vessel ischemia. No skull fracture.  CT CERVICAL SPINE FINDINGS  Right apical pleural parenchymal scarring is stable. No evidence of cervical spine fracture. Normal anterior-posterior alignment. Degenerative disc disease and degenerative facet change throughout the spine particularly involving the right facet joints  at C5, C6, and C7, with fusion at C5-6 on the right and near fusion at C6-7 as well as at C7-T1.  IMPRESSION: No acute intracranial abnormalities. No evidence of cervical spine fracture.   Electronically Signed   By: Skipper Cliche M.D.   On: 11/20/2014 20:39   Ct Pelvis Wo Contrast  11/20/2014   CLINICAL DATA:  Fall, right hip/coccyx pain, status post right hip replacement  EXAM: CT PELVIS WITHOUT CONTRAST  TECHNIQUE: Multidetector CT imaging of the pelvis was performed following the standard protocol without intravenous contrast.  COMPARISON:  CT  abdomen pelvis dated 06/18/2013  FINDINGS: Status post hysterectomy.  No adnexal masses.  Bladder is partially obscured by streak artifact but otherwise unremarkable.  No pelvic ascites.  No suspicious pelvic lymphadenopathy.  Status post PLIF at L4-5. Associated mild superior endplate fracture at L4 (series 206/ image 59), age indeterminate but new since 2014.  No evidence of sacrococcygeal fracture.  Status post right hip arthroplasty. No evidence of hardware loosening.  Mild sclerosis along the lateral aspect of the right acetabular roof/iliac bone (series 205/ image 81), without convincing linear fracture on axial images (series 201/image 72).  IMPRESSION: Status post right hip arthroplasty, without evidence of complication.  Mild sclerosis along the lateral aspect of the right acetabular roof/iliac bone, without convincing fracture.  No evidence of sacrococcygeal fracture.  Status post PLIF at L4-5. Associated mild superior endplate fracture at L4, age indeterminate but new since 2014.   Electronically Signed   By: Julian Hy M.D.   On: 11/20/2014 20:43   Dg Chest Port 1 View  11/21/2014   CLINICAL DATA:  Syncope and chest tightness; difficulty breathing  EXAM: PORTABLE CHEST - 1 VIEW  COMPARISON:  June 09, 2013  FINDINGS: There is no edema or consolidation. The heart size and pulmonary vascularity are normal. No adenopathy. There are breast implants bilaterally. There is postoperative change in the visualized upper lumbar spine.  IMPRESSION: No edema or consolidation.   Electronically Signed   By: Lowella Grip III M.D.   On: 11/21/2014 10:40    Scheduled Meds: . acetaminophen  650 mg Oral TID  . ALPRAZolam  0.5 mg Oral Once  . carvedilol  6.25 mg Oral BID WC  . Chlorhexidine Gluconate Cloth  6 each Topical Q0600  . dicyclomine  20 mg Oral BID  . escitalopram  20 mg Oral BID  . feeding supplement (ENSURE ENLIVE)  237 mL Oral BID BM  . mupirocin ointment  1 application Nasal BID  .  pantoprazole  40 mg Oral Daily  . sodium chloride  3 mL Intravenous Q12H   Continuous Infusions: . sodium chloride 50 mL/hr at 11/21/14 0128   Antibiotics Given (last 72 hours)    None      Principal Problem:   Syncope Active Problems:   Hypokalemia   Essential hypertension   CAD (coronary artery disease)   Fall   Acute respiratory failure, unspecified whether with hypoxia or hypercapnia    Time spent: 25 min    [facility] Pager [phone]. If 7PM-7AM, please contact night-coverage at [email or web address], password [facility] 11/22/2014, 4:27 PM

## 2014-11-23 ENCOUNTER — Observation Stay (HOSPITAL_COMMUNITY): Payer: Medicare Other

## 2014-11-23 DIAGNOSIS — F411 Generalized anxiety disorder: Secondary | ICD-10-CM | POA: Diagnosis present

## 2014-11-23 DIAGNOSIS — R55 Syncope and collapse: Secondary | ICD-10-CM | POA: Diagnosis not present

## 2014-11-23 LAB — CBC
HEMATOCRIT: 29.7 % — AB (ref 36.0–46.0)
HEMOGLOBIN: 10 g/dL — AB (ref 12.0–15.0)
MCH: 30.2 pg (ref 26.0–34.0)
MCHC: 33.7 g/dL (ref 30.0–36.0)
MCV: 89.7 fL (ref 78.0–100.0)
Platelets: 177 10*3/uL (ref 150–400)
RBC: 3.31 MIL/uL — ABNORMAL LOW (ref 3.87–5.11)
RDW: 13 % (ref 11.5–15.5)
WBC: 7.2 10*3/uL (ref 4.0–10.5)

## 2014-11-23 MED ORDER — ACETAMINOPHEN 325 MG PO TABS: 650.0000 mg | ORAL_TABLET | Freq: Four times a day (QID) | ORAL | Status: DC | PRN

## 2014-11-23 NOTE — Progress Notes (Signed)
Patient was discharged home by MD order; discharged instructions review and give to patient and her daughter with care notes and prescriptions; IV DIC; skin intact; patient will be escorted to the car by nurse tech via wheelchair.  

## 2014-11-23 NOTE — Progress Notes (Signed)
  Echocardiogram 2D Echocardiogram has been performed.  Allicia Culley FRANCES 11/23/2014, 12:59 PM

## 2014-11-23 NOTE — Discharge Summary (Signed)
Physician Discharge Summary  [name] CHY:[id number] DOB: March 30, 1935 DOA: 11/20/2014  PCP: Woody Seller, MD  Admit date: 11/20/2014 Discharge date: 11/23/2014  Time spent: greater than 30 minutes  Recommendations for Outpatient Follow-up:  1. Home with PT  Discharge Diagnoses:  Principal Problem:   Syncope Active Problems:   GERD   Hypokalemia   Essential hypertension   CAD (coronary artery disease)   Fall   Acute respiratory failure, unspecified whether with hypoxia or hypercapnia   Positive D dimer   Generalized anxiety disorder   Chronic diarrhea   Discharge Condition: stable  Diet recommendation: heart heatly  Filed Weights   11/21/14 1401  Weight: 49.2 kg (108 lb 7.5 oz)    History of present illness:  80 y.o. female with a history of Takotsubo Syndrome, CAD, HTN. Hyperlipidemia, and IBS who presents to the ED with complaints of passing out. She reports that she was grooming her dog in her kitchen and she must have passed out and as she fell she hit head on the sink. Her husband heard her fall and came found her on the Floor, when she came around she was hurting all over. She denies any prodrome, and denies and chest pain. She was referred for further evaluation of syncope.  Hospital Course:  Admitted to telemetry.   Syncope CTA chest negative. Doppler legs negative. sats fine on RA. Pt reports trying to wean off/stop xanax PTA. May have had withdrawal seizure. Resumed xanax. Echo ok. Worked with PT  Acute resp failure- had hypoxia on admission. Workup negative and resolved on its own. At discharge, sats normal on room air  Hypokalemia corrected  Procedures:  none  Consultations:  none  Discharge Exam: Filed Vitals:   11/23/14 1316  BP: 143/81  Pulse: 71  Temp: 98 F (36.7 C)  Resp: 18    General: alert. Cooperative.  Cardiovascular: RRR Respiratory: CTA  Discharge Instructions   Discharge Instructions    Diet  - low sodium heart healthy    Complete by:  As directed      Increase activity slowly    Complete by:  As directed           Current Discharge Medication List    START taking these medications   Details  acetaminophen (TYLENOL) 325 MG tablet Take 2 tablets (650 mg total) by mouth every 6 (six) hours as needed for mild pain.      CONTINUE these medications which have NOT CHANGED   Details  ALPRAZolam (XANAX) 0.5 MG tablet Take 1 tablet (0.5 mg total) by mouth 3 (three) times daily as needed for anxiety or sleep. For anxiety. Qty: 90 tablet, Refills: 0    aspirin EC 81 MG tablet Take 81 mg by mouth daily.    carvedilol (COREG) 6.25 MG tablet Take 6.25 mg by mouth 2 (two) times daily with a meal.     diphenoxylate-atropine (LOMOTIL) 2.5-0.025 MG per tablet Take 1 tablet by mouth 4 (four) times daily as needed for diarrhea or loose stools.     docusate sodium 100 MG CAPS Take 100 mg by mouth 2 (two) times daily. Qty: 60 capsule, Refills: 0    escitalopram (LEXAPRO) 10 MG tablet Take 20 mg by mouth 2 (two) times daily.     HYDROcodone-acetaminophen (NORCO/VICODIN) 5-325 MG per tablet Take 1-2 tablets by mouth every 4 (four) hours as needed for moderate pain. Qty: 100 tablet, Refills: 0    lisinopril (PRINIVIL,ZESTRIL) 5 MG tablet Take 1 tablet (5  mg total) by mouth daily. Qty: 30 tablet, Refills: 0    nitroGLYCERIN (NITROSTAT) 0.4 MG SL tablet Place 1 tablet (0.4 mg total) under the tongue every 5 (five) minutes as needed. For chest pain. Qty: 25 tablet, Refills: 2   Associated Diagnoses: Other and unspecified hyperlipidemia; Takotsubo syndrome    omeprazole (PRILOSEC) 20 MG capsule Take 20 mg by mouth daily.      traMADol (ULTRAM) 50 MG tablet Take 1 tablet (50 mg total) by mouth every 6 (six) hours as needed for moderate pain. Qty: 30 tablet, Refills: 0    trolamine salicylate (ASPERCREME) 10 % cream Apply 1 application topically as needed (for back pain).       STOP  taking these medications     dicyclomine (BENTYL) 20 MG tablet        Allergies  Allergen Reactions  . Sulfamethoxazole-Trimethoprim Hives   Follow-up Information    Follow up with [facility].   Why:  hhpt   Contact information:   [address] [place] [address] [phone]       Follow up with Woody Seller, MD.   Specialty:  [facility]   Contact information:   [address] [place] [address] [phone]        The results of significant diagnostics from this hospitalization (including imaging, microbiology, ancillary and laboratory) are listed below for reference.    Significant Diagnostic Studies: Ct Head Wo Contrast  11/20/2014   CLINICAL DATA:  Patient fell in her bathroom and hit her head with, no loss of consciousness  EXAM: CT HEAD WITHOUT CONTRAST  CT CERVICAL SPINE WITHOUT CONTRAST  TECHNIQUE: Multidetector CT imaging of the head and cervical spine was performed following the standard protocol without intravenous contrast. Multiplanar CT image reconstructions of the cervical spine were also generated.  COMPARISON:  06/16/2013  FINDINGS: CT HEAD FINDINGS  Moderate atrophy. No abnormal attenuation to suggest hemorrhage extra-axial fluid infarct or mass. Mild low attenuation in the deep white matter consistent with chronic small vessel ischemia. No skull fracture.  CT CERVICAL SPINE FINDINGS  Right apical pleural parenchymal scarring is stable. No evidence of cervical spine fracture. Normal anterior-posterior alignment. Degenerative disc disease and degenerative facet change throughout the spine particularly involving the right facet joints at C5, C6, and C7, with fusion at C5-6 on the right and near fusion at C6-7 as well as at C7-T1.  IMPRESSION: No acute intracranial abnormalities. No evidence of cervical spine fracture.   Electronically Signed   By: Skipper Cliche M.D.   On: 11/20/2014 20:39   Ct Angio Chest Pe W/cm &/or  Wo Cm  11/21/2014   CLINICAL DATA:  Elevated D-dimer levels with shortness of breath. Extreme right hip pain. History of skiing cancer. Evaluate for pulmonary embolism. Initial encounter.  EXAM: CT ANGIOGRAPHY CHEST WITH CONTRAST  TECHNIQUE: Multidetector CT imaging of the chest was performed using the standard protocol during bolus administration of intravenous contrast. Multiplanar CT image reconstructions and MIPs were obtained to evaluate the vascular anatomy.  CONTRAST:  66mL OMNIPAQUE IOHEXOL 350 MG/ML SOLN  COMPARISON:  Radiographs 11/21/2014 and 09/28/2014.  CT 03/16/2006.  FINDINGS: Mediastinum: The pulmonary arteries are well opacified with contrast. There is no evidence of acute pulmonary embolism. There is atherosclerosis of the aorta, great vessels and coronary arteries.The heart size is normal. There is a small amount of pericardial fluid. There are no enlarged mediastinal, hilar or axillary lymph nodes. The thyroid gland, trachea and  esophagus demonstrate no significant findings.  Lungs/Pleura: There is no significant pleural effusion. There is dependent atelectasis at both lung bases. Mild right apical scarring appears stable. There is no confluent airspace opacity or suspicious pulmonary nodule.  Upper abdomen: Unremarkable.  There is no adrenal mass.  Musculoskeletal/Chest wall: Peripherally calcified bilateral breast implants noted. Interval development of multiple thoracic compression deformities, most prominent at T9 and T12. These do not appear significantly progressive compared with more recent thoracic spine radiographs done last month.  Review of the MIP images confirms the above findings.  IMPRESSION: 1. No evidence of acute pulmonary embolism. 2. Dependent bibasilar atelectasis. 3. Multiple thoracic compression deformities, grossly stable from recent radiographs.   Electronically Signed   By: Richardean Sale M.D.   On: 11/21/2014 17:01   Ct Cervical Spine Wo Contrast  11/20/2014    CLINICAL DATA:  Patient fell in her bathroom and hit her head with, no loss of consciousness  EXAM: CT HEAD WITHOUT CONTRAST  CT CERVICAL SPINE WITHOUT CONTRAST  TECHNIQUE: Multidetector CT imaging of the head and cervical spine was performed following the standard protocol without intravenous contrast. Multiplanar CT image reconstructions of the cervical spine were also generated.  COMPARISON:  06/16/2013  FINDINGS: CT HEAD FINDINGS  Moderate atrophy. No abnormal attenuation to suggest hemorrhage extra-axial fluid infarct or mass. Mild low attenuation in the deep white matter consistent with chronic small vessel ischemia. No skull fracture.  CT CERVICAL SPINE FINDINGS  Right apical pleural parenchymal scarring is stable. No evidence of cervical spine fracture. Normal anterior-posterior alignment. Degenerative disc disease and degenerative facet change throughout the spine particularly involving the right facet joints at C5, C6, and C7, with fusion at C5-6 on the right and near fusion at C6-7 as well as at C7-T1.  IMPRESSION: No acute intracranial abnormalities. No evidence of cervical spine fracture.   Electronically Signed   By: Skipper Cliche M.D.   On: 11/20/2014 20:39   Ct Pelvis Wo Contrast  11/20/2014   CLINICAL DATA:  Fall, right hip/coccyx pain, status post right hip replacement  EXAM: CT PELVIS WITHOUT CONTRAST  TECHNIQUE: Multidetector CT imaging of the pelvis was performed following the standard protocol without intravenous contrast.  COMPARISON:  CT abdomen pelvis dated 06/18/2013  FINDINGS: Status post hysterectomy.  No adnexal masses.  Bladder is partially obscured by streak artifact but otherwise unremarkable.  No pelvic ascites.  No suspicious pelvic lymphadenopathy.  Status post PLIF at L4-5. Associated mild superior endplate fracture at L4 (series 206/ image 44), age indeterminate but new since 2014.  No evidence of sacrococcygeal fracture.  Status post right hip arthroplasty. No evidence of  hardware loosening.  Mild sclerosis along the lateral aspect of the right acetabular roof/iliac bone (series 205/ image 81), without convincing linear fracture on axial images (series 201/image 72).  IMPRESSION: Status post right hip arthroplasty, without evidence of complication.  Mild sclerosis along the lateral aspect of the right acetabular roof/iliac bone, without convincing fracture.  No evidence of sacrococcygeal fracture.  Status post PLIF at L4-5. Associated mild superior endplate fracture at L4, age indeterminate but new since 2014.   Electronically Signed   By: Julian Hy M.D.   On: 11/20/2014 20:43   Dg Chest Port 1 View  11/21/2014   CLINICAL DATA:  Syncope and chest tightness; difficulty breathing  EXAM: PORTABLE CHEST - 1 VIEW  COMPARISON:  June 09, 2013  FINDINGS: There is no edema or consolidation. The heart size and pulmonary vascularity are  normal. No adenopathy. There are breast implants bilaterally. There is postoperative change in the visualized upper lumbar spine.  IMPRESSION: No edema or consolidation.   Electronically Signed   By: Lowella Grip III M.D.   On: 11/21/2014 10:40   Echo Left ventricle: The cavity size was normal. Wall thickness was normal. Systolic function was normal. The estimated ejection fraction was in the range of 55% to 60%. Wall motion was normal; there were no regional wall motion abnormalities. Doppler parameters are consistent with abnormal left ventricular relaxation (grade 1 diastolic dysfunction). Microbiology: Recent Results (from the past 240 hour(s))  MRSA PCR Screening     Status: Abnormal   Collection Time: 11/21/14  2:06 AM  Result Value Ref Range Status   MRSA by PCR POSITIVE (A) NEGATIVE Final    Comment:        The GeneXpert MRSA Assay (FDA approved for NASAL specimens only), is one component of a comprehensive MRSA colonization surveillance program. It is not intended to diagnose MRSA infection nor to guide  or monitor treatment for MRSA infections. RESULT CALLED TO, READ BACK BY AND VERIFIED WITH: GRAHAM,M RN [id number] 11/21/14 MITCHELL,L      Labs: Basic Metabolic Panel:  Recent Labs Lab 11/20/14 2025 11/21/14 0706 11/22/14 0540  NA 135 137 139  K 3.2* 3.9 3.7  CL 101 103 105  CO2 25 27 29   GLUCOSE 104* 96 105*  BUN 9 8 5*  CREATININE 0.80 0.85 0.78  CALCIUM 8.5 8.2* 8.1*   Liver Function Tests: No results for input(s): AST, ALT, ALKPHOS, BILITOT, PROT, ALBUMIN in the last 168 hours. No results for input(s): LIPASE, AMYLASE in the last 168 hours. No results for input(s): AMMONIA in the last 168 hours. CBC:  Recent Labs Lab 11/20/14 2025 11/21/14 0706 11/22/14 0540 11/23/14 0654  WBC 10.2 7.9 7.6 7.2  HGB 11.0* 10.1* 9.9* 10.0*  HCT 32.9* 30.5* 29.5* 29.7*  MCV 91.6 91.6 90.5 89.7  PLT 188 178 168 177   Cardiac Enzymes:  Recent Labs Lab 11/21/14 0051 11/21/14 0706 11/21/14 1137  TROPONINI <0.03 <0.03 <0.03   BNP: BNP (last 3 results) No results for input(s): BNP in the last 8760 hours.  ProBNP (last 3 results) No results for input(s): PROBNP in the last 8760 hours.  CBG: No results for input(s): GLUCAP in the last 168 hours.     SignedDelfina Redwood  [facility] 11/23/2014, 3:23 PM

## 2014-11-23 NOTE — Progress Notes (Signed)
Physical Therapy Treatment Patient Details Name: [name] MRN: [id number] DOB: 02-24-1935 Today's Date: 11/23/2014    History of Present Illness [name] is a 80 y.o. female with a history of Takotsubo Syndrome, CAD, HTN. Hyperlipidemia, and IBS who presents to the ED with complaints of passing out. She reports that she was grooming her dog in her kitchen and she must have passed out and as she fell she hit head on the sink. D-dimer elevated, pt being evaluated for PE. At baseline, has memory loss.    PT Comments    Progressing towards physical therapy goals. She remembered today that she has stairs to ascend in order to enter her home. [name] safely completed this task with moderate difficulty however did not require physical assist. Ambulating very slowly up to 155 feet while using a rolling walker for support. Due to history of falls, and lack of support at home, I feel she would benefit from a home safety eval from physical therapy prior to progressing to outpatient PT.  Follow Up Recommendations  Supervision for mobility/OOB;Home health PT     Equipment Recommendations  None recommended by PT    Recommendations for Other Services OT consult     Precautions / Restrictions Precautions Precautions: Fall Precaution Comments: has had multiple falls with injuries Restrictions Weight Bearing Restrictions: No    Mobility  Bed Mobility Overal bed mobility: Modified Independent             General bed mobility comments: pt able to get to EOB without physical assist but needs increased time  Transfers Overall transfer level: Needs assistance Equipment used: Rolling walker (2 wheeled) Transfers: Sit to/from Stand Sit to Stand: Min guard         General transfer comment: min-guard due to pt unsteady from bed and BSC. Once upright with RW support pt stable.  Ambulation/Gait Ambulation/Gait assistance: Min guard Ambulation Distance (Feet): 155 Feet Assistive  device: Rolling walker (2 wheeled) Gait Pattern/deviations: Step-through pattern;Decreased stride length;Narrow base of support;Trunk flexed Gait velocity: slow   General Gait Details: Ataxic pattern not noticed today during mobility. Ambulates very slowly however with good stability. Intermittent cues for walker placement closer to base of support and upright posture with forward gaze. No loss of balance or buckling noted during bout. Min guard provided for safety.   Stairs Stairs: Yes Stairs assistance: Min guard Stair Management: One rail Right;Sideways;Step to pattern Number of Stairs: 4 General stair comments: Educated on safe stair navigation technique with bil UE use on single rail for support. VC for seqencing. Pt completed at min guard level. Very slow and cautious however completed without physical assist.  Wheelchair Mobility    Modified Rankin (Stroke Patients Only)       Balance                                    Cognition Arousal/Alertness: Awake/alert Behavior During Therapy: WFL for tasks assessed/performed Overall Cognitive Status: History of cognitive impairments - at baseline       Memory: Decreased short-term memory              Exercises      General Comments        Pertinent Vitals/Pain Pain Assessment: No/denies pain    Home Living                      Prior Function  PT Goals (current goals can now be found in the care plan section) Acute Rehab PT Goals Patient Stated Goal: return home to her dog, stop falling PT Goal Formulation: With patient Time For Goal Achievement: 12/06/14 Potential to Achieve Goals: Good Progress towards PT goals: Progressing toward goals    Frequency  Min 3X/week    PT Plan Discharge plan needs to be updated    Co-evaluation             End of Session Equipment Utilized During Treatment: Gait belt Activity Tolerance: Patient tolerated treatment well Patient  left: in chair;with chair alarm set;with call bell/phone within reach     Time: 1009-1043 PT Time Calculation (min) (ACUTE ONLY): 34 min  Charges:  $Gait Training: 8-22 mins $Therapeutic Activity: 8-22 mins                    G Codes:      Ellouise Newer 12-05-2014, 11:04 AM Elayne Snare, [place]

## 2015-01-03 ENCOUNTER — Ambulatory Visit: Payer: Medicare Other | Attending: Family Medicine | Admitting: Physical Therapy

## 2015-06-10 ENCOUNTER — Encounter: Payer: Self-pay | Admitting: Cardiovascular Disease

## 2015-06-10 ENCOUNTER — Ambulatory Visit (INDEPENDENT_AMBULATORY_CARE_PROVIDER_SITE_OTHER): Payer: Medicare Other | Admitting: Cardiovascular Disease

## 2015-06-10 VITALS — BP 118/72 | HR 68 | Ht 62.0 in | Wt 114.0 lb

## 2015-06-10 DIAGNOSIS — I10 Essential (primary) hypertension: Secondary | ICD-10-CM | POA: Diagnosis not present

## 2015-06-10 DIAGNOSIS — E785 Hyperlipidemia, unspecified: Secondary | ICD-10-CM | POA: Diagnosis not present

## 2015-06-10 DIAGNOSIS — I5181 Takotsubo syndrome: Secondary | ICD-10-CM

## 2015-06-10 MED ORDER — CARVEDILOL 3.125 MG PO TABS: 3.1250 mg | ORAL_TABLET | Freq: Two times a day (BID) | ORAL | Status: DC

## 2015-06-10 NOTE — Progress Notes (Signed)
Cardiology Office Note Date:  06/12/2015   ID:  [name], DOB [birth year], MRN [id number]  PCP:  Desiree Seller, MD  Cardiologist:  Desiree Mocha, MD    Chief Complaint  Patient presents with  . Appointment    palpitations, wants to know about the arteries in her heart, recalls a test from "a while ago, but have never heard anything else on what to do"     History of Present Illness: [name] is a 80 y.o. female who presents for follow-up evaluation. The patient is followed for Takotsubo's cardiomyopathy. Her acute event occurred in 2007. Cardiac catheterization demonstrated mild nonobstructive CAD. A followup echocardiogram in December 2009 demonstrated normal left ventricular systolic function with an ejection fraction of 55-60% and no regional wall motion abnormalities. A nuclear scan in 2012 showed no ischemia. The gated LVEF was 82%.  The patient was hospitalized for an episode of syncope in May 2016. At the time she was trying to wean herself off of alprazolam and there is some question of whether she may have had a withdrawal seizure.  The patient is here today with her daughter. She was under the impression that she had some small blockages that could not be treated with stents. I am not sure where she was informed of this, but I assured her it was not accurate information. I went back and reviewed her cardiac catheterization report and she had a 50% lesion in the mid LAD associated with intramyocardial bridging which is commonly seen with Takotsubo's Syndrome. I have followed her for the past 8-9 years and she has had no symptoms of coronary ischemia. In addition, she had a normal nuclear scan in 2012 as outlined above.  The patient denies chest pain or shortness of breath. She does complain of heart palpitations. Her primary issue at present his gait unsteadiness. She ambulates now with a walker all of the time. She is on chronic narcotics and anxiolytics. Her daughter is  concerned about the amount of medication that she is taking. Past Medical History  Diagnosis Date  . Takotsubo syndrome   . Other and unspecified hyperlipidemia   . Esophageal reflux   . Osteoarthrosis, unspecified whether generalized or localized, lower leg   . IBS (irritable bowel syndrome)   . Diverticular disease   . Shortness of breath     when my heart is not doing right  . Anemia   . Depression   . Coronary artery disease   . Blood transfusion     2005 maybe  . Headache(784.0)     takes Prilosec  . Hypertension   . Falls frequently   . Anxiety     confusion    Past Surgical History  Procedure Laterality Date  . Knee surgery    . Hysterotomy    . Breast reconstruction    . Implantable contact lens implantation    . Cataract extraction w/ intraocular lens  implant, bilateral    . Tonsillectomy      as child  . Back surgery    . Hip arthroplasty Right 06/10/2013    Procedure: ARTHROPLASTY MONOPOLAR HIP CEMENTED;  Surgeon: Desiree Killings, MD;  Location: [facility];  Service: Orthopedics;  Laterality: Right;  . Joint replacement    . Fracture surgery    . Kyphoplasty N/A 11/05/2013    Procedure: [name];  Surgeon: Desiree Charter, MD;  Location: [facility];  Service: Neurosurgery;  Laterality: N/A;    Current Outpatient Prescriptions  Medication Sig Dispense Refill  . ALPRAZolam (XANAX) 0.5 MG tablet Take 1 tablet (0.5 mg total) by mouth 3 (three) times daily as needed for anxiety or sleep. For anxiety. 90 tablet 0  . aspirin EC 81 MG tablet Take 81 mg by mouth daily.    . Mckay Citrate-Vitamin D (Mckay + D PO) Take 1 tablet by mouth daily.    . carvedilol (COREG) 3.125 MG tablet Take 1 tablet (3.125 mg total) by mouth 2 (Mckay) times daily with a meal. 60 tablet 11  . diphenoxylate-atropine (LOMOTIL) 2.5-0.025 MG per tablet Take 1 tablet by mouth 4 (four) times daily as needed for diarrhea or loose stools.     [name] (STOOL SOFTENER PO)  Take 1 tablet by mouth as needed.    Marland Kitchen escitalopram (LEXAPRO) 10 MG tablet Take 20 mg by mouth 2 (Mckay) times daily.     Marland Kitchen HYDROcodone-acetaminophen (NORCO/VICODIN) 5-325 MG per tablet Take 1-2 tablets by mouth every 4 (four) hours as needed for moderate pain. 100 tablet 0  . nitroGLYCERIN (NITROSTAT) 0.4 MG SL tablet Place 1 tablet (0.4 mg total) under the tongue every 5 (five) minutes as needed. For chest pain. 25 tablet 2  . omeprazole (PRILOSEC) 20 MG capsule Take 20 mg by mouth daily.      . Probiotic Product (PROBIOTIC DAILY PO) Take 1 tablet by mouth daily.    Marland Kitchen trolamine salicylate (ASPERCREME) 10 % cream Apply 1 application topically as needed (for back pain).     . vitamin B-12 (CYANOCOBALAMIN) 1000 MCG tablet Take 1,000 mcg by mouth daily.     No current facility-administered medications for this visit.    Allergies:   Adhesive and Sulfamethoxazole-trimethoprim   Social History:  The patient  reports that she has never smoked. She has never used smokeless tobacco. She reports that she does not drink alcohol or use illicit drugs.   Family History:  The patient's  family history is not on file.    ROS:  Please see the history of present illness.  Otherwise, review of systems is positive for abdominal pain, diarrhea, depression, back pain, muscle pain, easy bruising, anxiety, balance problems, headaches.  All other systems are reviewed and negative.    PHYSICAL EXAM: VS:  BP 118/72 mmHg  Pulse 68  Ht 5\' 2"  (1.575 m)  Wt 114 lb (51.71 kg)  BMI 20.85 kg/m2 , BMI Body mass index is 20.85 kg/(m^2). GEN: Frail, elderly woman, in no acute distress HEENT: normal Neck: no JVD, no masses. No carotid bruits Cardiac: RRR without murmur or gallop                Respiratory:  clear to auscultation bilaterally, normal work of breathing GI: soft, nontender, nondistended, + BS MS: no deformity or atrophy Ext: no pretibial edema, pedal pulses 2+= bilaterally Skin: warm and dry, no  rash Neuro:  Strength and sensation are intact Psych: euthymic mood, full affect  EKG:  EKG is ordered today. The ekg ordered today shows normal sinus rhythm 68 bpm, occasional PAC, otherwise within normal limits.  Recent Labs: 08/12/2014: ALT 12 11/21/2014: TSH 1.349 11/22/2014: BUN 5*; Creatinine, Ser 0.78; Potassium 3.7; Sodium 139 11/23/2014: Hemoglobin 10.0*; Platelets 177   Lipid Panel     Component Value Date/Time   CHOL 167 02/15/2011 0400   TRIG 82 02/15/2011 0400   HDL 59 02/15/2011 0400   CHOLHDL 2.8 02/15/2011 0400   VLDL 16 02/15/2011 0400   LDLCALC 92 02/15/2011 0400  Wt Readings from Last 3 Encounters:  06/10/15 114 lb (51.71 kg)  11/21/14 108 lb 7.5 oz (49.2 kg)  08/12/14 114 lb (51.71 kg)     Cardiac Studies Reviewed: 2-D echocardiogram 11/23/2014: Study Conclusions  - Left ventricle: The cavity size was normal. Wall thickness was normal. Systolic function was normal. The estimated ejection fraction was in the range of 55% to 60%. Wall motion was normal; there were no regional wall motion abnormalities. Doppler parameters are consistent with abnormal left ventricular relaxation (grade 1 diastolic dysfunction).  CT angiogram of the chest: 11/21/2014: IMPRESSION: 1. No evidence of acute pulmonary embolism. 2. Dependent bibasilar atelectasis. 3. Multiple thoracic compression deformities, grossly stable from recent radiographs.  ASSESSMENT AND PLAN: 1.  Syncope: no recurrence - event now 6 months ago. Multiple possible etiologies, polypharmacy is high on the list. Echo at the time of hospitalization was normal.  2. Takotsubo's Syndrome: no recurrence since initial event in 2007. Will decrease carvedilol to minimize potential hypotension as she has become more frail and unsteady.   Current medicines are reviewed with the patient today.  The patient does not have concerns regarding medicines.  Labs/ tests ordered today include:   Orders  Placed This Encounter  Procedures  . EKG 12-Lead    Disposition:   FU one year  Signed, Desiree Mocha, MD  06/12/2015 10:43 PM    [facility] [place], [place], [place]  [address] Phone: [phone]; Fax: [phone]

## 2015-06-10 NOTE — Patient Instructions (Signed)
Medication Instructions:  Your physician has recommended you make the following change in your medication:  1. DECREASE Carvedilol to 3.125mg take one tablet by mouth twice a day  Labwork: No new orders.   Testing/Procedures: No new orders.   Follow-Up: Your physician wants you to follow-up in: 1 YEAR with Dr Cooper.  You will receive a reminder letter in the mail two months in advance. If you don't receive a letter, please call our office to schedule the follow-up appointment.   Any Other Special Instructions Will Be Listed Below (If Applicable).     If you need a refill on your cardiac medications before your next appointment, please call your pharmacy.   

## 2016-01-11 ENCOUNTER — Other Ambulatory Visit: Payer: Self-pay | Admitting: *Deleted

## 2016-01-11 DIAGNOSIS — I10 Essential (primary) hypertension: Secondary | ICD-10-CM

## 2016-01-11 MED ORDER — CARVEDILOL 3.125 MG PO TABS: 3.1250 mg | ORAL_TABLET | Freq: Two times a day (BID) | ORAL | Status: DC

## 2016-01-11 NOTE — Telephone Encounter (Signed)
carvedilol (COREG) 3.125 MG tablet  Medication   Date: 06/10/2015  Department: [facility]  Ordering/Authorizing: Sherren Mocha, MD      Order Providers    Prescribing Provider Encounter Provider   Sherren Mocha, MD Sherren Mocha, MD    Medication Detail      Disp Refills Start End     carvedilol (COREG) 3.125 MG tablet 60 tablet 11 06/10/2015     Sig - Route: Take 1 tablet (3.125 mg total) by mouth 2 (two) times daily with a meal. - Oral    E-Prescribing Status: Receipt confirmed by pharmacy (06/10/2015 4:28 PM EST)     Associated Diagnoses    Essential hypertension - Primary       Pharmacy    [id number] - [place], [place] - [address]     Pharmacy requested 90 day supply, will send it in

## 2016-04-05 ENCOUNTER — Encounter (HOSPITAL_COMMUNITY): Payer: Self-pay

## 2016-04-05 ENCOUNTER — Emergency Department (HOSPITAL_COMMUNITY): Payer: Medicare Other

## 2016-04-05 ENCOUNTER — Emergency Department (HOSPITAL_COMMUNITY)
Admission: EM | Admit: 2016-04-05 | Discharge: 2016-04-06 | Disposition: A | Discharge status: Home / Self Care | Payer: Medicare Other | Attending: Emergency Medicine | Admitting: Emergency Medicine

## 2016-04-05 DIAGNOSIS — S6991XA Unspecified injury of right wrist, hand and finger(s), initial encounter: Secondary | ICD-10-CM | POA: Diagnosis present

## 2016-04-05 DIAGNOSIS — M545 Low back pain: Secondary | ICD-10-CM | POA: Diagnosis not present

## 2016-04-05 DIAGNOSIS — Z79899 Other long term (current) drug therapy: Secondary | ICD-10-CM | POA: Insufficient documentation

## 2016-04-05 DIAGNOSIS — I10 Essential (primary) hypertension: Secondary | ICD-10-CM | POA: Insufficient documentation

## 2016-04-05 DIAGNOSIS — Y939 Activity, unspecified: Secondary | ICD-10-CM | POA: Diagnosis not present

## 2016-04-05 DIAGNOSIS — Y929 Unspecified place or not applicable: Secondary | ICD-10-CM | POA: Insufficient documentation

## 2016-04-05 DIAGNOSIS — W19XXXA Unspecified fall, initial encounter: Secondary | ICD-10-CM

## 2016-04-05 DIAGNOSIS — S8001XA Contusion of right knee, initial encounter: Secondary | ICD-10-CM

## 2016-04-05 DIAGNOSIS — W01198A Fall on same level from slipping, tripping and stumbling with subsequent striking against other object, initial encounter: Secondary | ICD-10-CM | POA: Insufficient documentation

## 2016-04-05 DIAGNOSIS — I2510 Atherosclerotic heart disease of native coronary artery without angina pectoris: Secondary | ICD-10-CM | POA: Diagnosis not present

## 2016-04-05 DIAGNOSIS — S62609A Fracture of unspecified phalanx of unspecified finger, initial encounter for closed fracture: Secondary | ICD-10-CM

## 2016-04-05 DIAGNOSIS — S62610A Displaced fracture of proximal phalanx of right index finger, initial encounter for closed fracture: Secondary | ICD-10-CM | POA: Insufficient documentation

## 2016-04-05 DIAGNOSIS — Z7982 Long term (current) use of aspirin: Secondary | ICD-10-CM | POA: Insufficient documentation

## 2016-04-05 DIAGNOSIS — Y999 Unspecified external cause status: Secondary | ICD-10-CM | POA: Diagnosis not present

## 2016-04-05 NOTE — ED Provider Notes (Signed)
[facility] DEPT Provider Note   CSN: [id number] Arrival date & time: 04/05/16  2042  By signing my name below, I, Dora Sims, attest that this documentation has been prepared under the direction and in the presence of physician practitioner, Virgel Manifold, MD. Electronically Signed: Dora Sims, Scribe. 04/05/2016. 10:29 PM.  History   Chief Complaint Chief Complaint  Patient presents with  . Fall   The history is provided by the patient. No language interpreter was used.     HPI Comments: [name] is a 81 y.o. female who presents to the Emergency Department complaining of multiple pains s/p falling shortly PTA. She slipped on a hardwood floor while wearing fool socks and fell backwards. Pt notes a painful abrasion to her right index finger, right knee pain, and lower back pain. She also notes she hit her head during the fall and endorses gradually improving pain at the top of her scalp. Pt reports she has some back pain at baseline due to a history of back issues and back surgery, and notes this pain was exacerbated by the fall. She notes h/o right knee replacement. Pt has been ambulatory since the fall. She states that she falls regularly but has decreased her falls lately. She denies headache, syncope, nausea, vomiting, or any other associated symptoms.  Past Medical History:  Diagnosis Date  . Anemia   . Anxiety    confusion  . Blood transfusion    2005 maybe  . Coronary artery disease   . Depression   . Diverticular disease   . Esophageal reflux   . Falls frequently   . Headache(784.0)    takes Prilosec  . Hypertension   . IBS (irritable bowel syndrome)   . Osteoarthrosis, unspecified whether generalized or localized, lower leg   . Other and unspecified hyperlipidemia   . Shortness of breath    when my heart is not doing right  . Takotsubo syndrome     Patient Active Problem List   Diagnosis Date Noted  . Anxiety state 11/23/2014  . Generalized anxiety  disorder 11/22/2014  . Chronic diarrhea 11/22/2014  . Positive D dimer   . Essential hypertension 11/21/2014  . CAD (coronary artery disease) 11/21/2014  . Fall   . Acute respiratory failure, unspecified whether with hypoxia or hypercapnia ([place])   . Syncope 11/20/2014  . Lumbar compression fracture ([place]) 11/05/2013  . Thrombocytosis ([place]) 06/21/2013  . Acute delirium 06/15/2013  . Hypokalemia 06/14/2013  . Acute blood loss anemia 06/11/2013  . Protein-calorie malnutrition, severe ([place]) 06/10/2013  . Dehydration 06/09/2013  . GAD (generalized anxiety disorder) 06/09/2013  . HYPERLIPIDEMIA 05/25/2009  . TAKOTSUBO SYNDROME 05/25/2009  . GERD 05/25/2009  . DIVERTICULAR DISEASE 05/25/2009  . OSTEOARTHRITIS, KNEE, RIGHT 05/25/2009  . IRRITABLE BOWEL SYNDROME, HX OF 05/25/2009    Past Surgical History:  Procedure Laterality Date  . BACK SURGERY    . BREAST RECONSTRUCTION    . CATARACT EXTRACTION W/ INTRAOCULAR LENS  IMPLANT, BILATERAL    . FRACTURE SURGERY    . HIP ARTHROPLASTY Right 06/10/2013   Procedure: ARTHROPLASTY MONOPOLAR HIP CEMENTED;  Surgeon: Marybelle Killings, MD;  Location: [facility];  Service: Orthopedics;  Laterality: Right;  . HYSTEROTOMY    . IMPLANTABLE CONTACT LENS IMPLANTATION    . JOINT REPLACEMENT    . KNEE SURGERY    . KYPHOPLASTY N/A 11/05/2013   Procedure: Jone Baseman TWO;  Surgeon: Ophelia Charter, MD;  Location: [facility];  Service: Neurosurgery;  Laterality: N/A;  .  TONSILLECTOMY     as child    OB History    No data available       Home Medications    Prior to Admission medications   Medication Sig Start Date End Date Taking? Authorizing Provider  ALPRAZolam Duanne Moron) 0.5 MG tablet Take 1 tablet (0.5 mg total) by mouth 3 (three) times daily as needed for anxiety or sleep. For anxiety. 06/22/13   Annita Brod, MD  aspirin EC 81 MG tablet Take 81 mg by mouth daily.    Historical Provider, MD  Calcium Citrate-Vitamin D (CALCIUM + D PO)  Take 1 tablet by mouth daily.    Historical Provider, MD  carvedilol (COREG) 3.125 MG tablet Take 1 tablet (3.125 mg total) by mouth 2 (two) times daily with a meal. 01/11/16   Sherren Mocha, MD  diphenoxylate-atropine (LOMOTIL) 2.5-0.025 MG per tablet Take 1 tablet by mouth 4 (four) times daily as needed for diarrhea or loose stools.  04/27/14   Historical Provider, MD  Docusate Calcium (STOOL SOFTENER PO) Take 1 tablet by mouth as needed.    Historical Provider, MD  escitalopram (LEXAPRO) 10 MG tablet Take 20 mg by mouth 2 (two) times daily.     Historical Provider, MD  HYDROcodone-acetaminophen (NORCO/VICODIN) 5-325 MG per tablet Take 1-2 tablets by mouth every 4 (four) hours as needed for moderate pain. 11/06/13   Newman Pies, MD  nitroGLYCERIN (NITROSTAT) 0.4 MG SL tablet Place 1 tablet (0.4 mg total) under the tongue every 5 (five) minutes as needed. For chest pain. 04/22/13   Sherren Mocha, MD  omeprazole (PRILOSEC) 20 MG capsule Take 20 mg by mouth daily.      Historical Provider, MD  Probiotic Product (PROBIOTIC DAILY PO) Take 1 tablet by mouth daily.    Historical Provider, MD  trolamine salicylate (ASPERCREME) 10 % cream Apply 1 application topically as needed (for back pain).     Historical Provider, MD  vitamin B-12 (CYANOCOBALAMIN) 1000 MCG tablet Take 1,000 mcg by mouth daily.    Historical Provider, MD    Family History No family history on file.  Social History Social History  Substance Use Topics  . Smoking status: Never Smoker  . Smokeless tobacco: Never Used  . Alcohol use No     Allergies   Adhesive [tape] and Sulfamethoxazole-trimethoprim   Review of Systems Review of Systems  Gastrointestinal: Negative for nausea and vomiting.  Musculoskeletal: Positive for arthralgias (right knee) and back pain (lower; chronic; exacerbated by fall).  Skin: Positive for wound (abrasion to right index finger).  Neurological: Negative for syncope and headaches.  All other  systems reviewed and are negative.   Physical Exam Updated Vital Signs BP 161/81 (BP Location: Left Arm)   Pulse 80   Temp 99.6 F (37.6 C) (Temporal)   Resp 20   Ht 5\' 4"  (1.626 m)   Wt 120 lb (54.4 kg)   SpO2 98%   BMI 20.60 kg/m   Physical Exam  Constitutional: She is oriented to person, place, and time. She appears well-developed and well-nourished. No distress.  HENT:  Head: Normocephalic and atraumatic.  Eyes: EOM are normal.  Neck: Normal range of motion.  Cardiovascular: Normal rate, regular rhythm and normal heart sounds.   Pulmonary/Chest: Effort normal and breath sounds normal.  Abdominal: Soft. She exhibits no distension. There is no tenderness.  Musculoskeletal: Normal range of motion.  Well-healed surgical scars for right knee. No swelling or deformity of right knee. Mild TTP interior right  knee. TTP over the right greater trochanter. Mild swelling and tenderness to the PIP right index finger. Neurovascularly intact.    Neurological: She is alert and oriented to person, place, and time.  Skin: Skin is warm and dry.  Psychiatric: She has a normal mood and affect. Judgment normal.  Nursing note and vitals reviewed.   ED Treatments / Results  Labs (all labs ordered are listed, but only abnormal results are displayed) Labs Reviewed - No data to display  EKG  EKG Interpretation None       Radiology No results found.  Procedures Procedures (including critical care time)  DIAGNOSTIC STUDIES: Oxygen Saturation is 98% on RA, normal by my interpretation.    COORDINATION OF CARE: 10:36 PM Discussed treatment plan with pt at bedside and pt agreed to plan.  Medications Ordered in ED Medications - No data to display   Initial Impression / Assessment and Plan / ED Course  I have reviewed the triage vital signs and the nursing notes.  Pertinent labs & imaging results that were available during my care of the patient were reviewed by me and considered  in my medical decision making (see chart for details).  Clinical Course   I personally preformed the services scribed in my presence. The recorded information has been reviewed is accurate. Virgel Manifold, MD.   Final Clinical Impressions(s) / ED Diagnoses   Final diagnoses:  Fall, initial encounter  Knee contusion, right, initial encounter  Finger fracture, closed, initial encounter    New Prescriptions New Prescriptions   No medications on file     Virgel Manifold, MD 04/08/16 [phone]

## 2016-04-05 NOTE — ED Triage Notes (Signed)
I took a bath, put on my socks, and stood up to get dressed.  Slipped on the hard wood floors, injuring my right knee and having pain in my back.  I have a history of back and knee pain before but now I am having trouble walking.

## 2016-04-05 NOTE — ED Triage Notes (Signed)
Patient states that she hit her head over the night stand and injured her right index finger when trying to catch herself.  When she came down her body landed on both hands.  Denies loss of consciousness.

## 2016-04-05 NOTE — ED Notes (Signed)
Patient placed in gown. Patient given yellow socks and fall risk bracelet at this time.

## 2016-04-06 MED ORDER — TRAMADOL HCL 50 MG PO TABS
50.0000 mg | ORAL_TABLET | Freq: Once | ORAL | Status: AC
  Administered 2016-04-06: 50 mg via ORAL
  Filled 2016-04-06: qty 1

## 2016-04-06 MED ORDER — TRAMADOL HCL 50 MG PO TABS: 50.0000 mg | ORAL_TABLET | Freq: Three times a day (TID) | ORAL | 0 refills | Status: DC | PRN

## 2016-05-14 ENCOUNTER — Other Ambulatory Visit: Payer: Self-pay | Admitting: Cardiovascular Disease

## 2016-05-14 DIAGNOSIS — I10 Essential (primary) hypertension: Secondary | ICD-10-CM

## 2016-06-21 ENCOUNTER — Ambulatory Visit: Payer: Medicare Other | Admitting: Cardiovascular Disease

## 2016-11-09 ENCOUNTER — Emergency Department (HOSPITAL_COMMUNITY): Payer: Medicare Other

## 2016-11-09 ENCOUNTER — Observation Stay (HOSPITAL_COMMUNITY)
Admission: EM | Admit: 2016-11-09 | Discharge: 2016-11-11 | Disposition: A | Discharge status: Skilled Nursing Facility | Payer: Medicare Other | Attending: Internal Medicine | Admitting: Internal Medicine

## 2016-11-09 ENCOUNTER — Encounter (HOSPITAL_COMMUNITY): Payer: Self-pay | Admitting: Emergency Medicine

## 2016-11-09 DIAGNOSIS — I110 Hypertensive heart disease with heart failure: Secondary | ICD-10-CM | POA: Diagnosis not present

## 2016-11-09 DIAGNOSIS — E860 Dehydration: Secondary | ICD-10-CM | POA: Diagnosis not present

## 2016-11-09 DIAGNOSIS — E876 Hypokalemia: Secondary | ICD-10-CM

## 2016-11-09 DIAGNOSIS — I10 Essential (primary) hypertension: Secondary | ICD-10-CM

## 2016-11-09 DIAGNOSIS — Z66 Do not resuscitate: Secondary | ICD-10-CM | POA: Insufficient documentation

## 2016-11-09 DIAGNOSIS — F05 Delirium due to known physiological condition: Secondary | ICD-10-CM | POA: Insufficient documentation

## 2016-11-09 DIAGNOSIS — D72825 Bandemia: Secondary | ICD-10-CM

## 2016-11-09 DIAGNOSIS — I2510 Atherosclerotic heart disease of native coronary artery without angina pectoris: Secondary | ICD-10-CM

## 2016-11-09 DIAGNOSIS — Z7982 Long term (current) use of aspirin: Secondary | ICD-10-CM | POA: Insufficient documentation

## 2016-11-09 DIAGNOSIS — S52531A Colles' fracture of right radius, initial encounter for closed fracture: Principal | ICD-10-CM

## 2016-11-09 DIAGNOSIS — R5381 Other malaise: Secondary | ICD-10-CM

## 2016-11-09 DIAGNOSIS — I313 Pericardial effusion (noninflammatory): Secondary | ICD-10-CM | POA: Diagnosis not present

## 2016-11-09 DIAGNOSIS — F411 Generalized anxiety disorder: Secondary | ICD-10-CM | POA: Insufficient documentation

## 2016-11-09 DIAGNOSIS — Z79899 Other long term (current) drug therapy: Secondary | ICD-10-CM | POA: Insufficient documentation

## 2016-11-09 DIAGNOSIS — I5032 Chronic diastolic (congestive) heart failure: Secondary | ICD-10-CM | POA: Diagnosis not present

## 2016-11-09 DIAGNOSIS — S52551A Other extraarticular fracture of lower end of right radius, initial encounter for closed fracture: Secondary | ICD-10-CM

## 2016-11-09 DIAGNOSIS — R0902 Hypoxemia: Secondary | ICD-10-CM | POA: Diagnosis not present

## 2016-11-09 DIAGNOSIS — K589 Irritable bowel syndrome without diarrhea: Secondary | ICD-10-CM | POA: Diagnosis not present

## 2016-11-09 DIAGNOSIS — D72829 Elevated white blood cell count, unspecified: Secondary | ICD-10-CM | POA: Diagnosis present

## 2016-11-09 DIAGNOSIS — W010XXA Fall on same level from slipping, tripping and stumbling without subsequent striking against object, initial encounter: Secondary | ICD-10-CM | POA: Insufficient documentation

## 2016-11-09 DIAGNOSIS — R55 Syncope and collapse: Secondary | ICD-10-CM

## 2016-11-09 DIAGNOSIS — F329 Major depressive disorder, single episode, unspecified: Secondary | ICD-10-CM | POA: Diagnosis not present

## 2016-11-09 DIAGNOSIS — K219 Gastro-esophageal reflux disease without esophagitis: Secondary | ICD-10-CM

## 2016-11-09 DIAGNOSIS — W19XXXA Unspecified fall, initial encounter: Secondary | ICD-10-CM

## 2016-11-09 DIAGNOSIS — F0390 Unspecified dementia without behavioral disturbance: Secondary | ICD-10-CM | POA: Diagnosis present

## 2016-11-09 DIAGNOSIS — E785 Hyperlipidemia, unspecified: Secondary | ICD-10-CM | POA: Diagnosis not present

## 2016-11-09 DIAGNOSIS — Z882 Allergy status to sulfonamides status: Secondary | ICD-10-CM | POA: Insufficient documentation

## 2016-11-09 DIAGNOSIS — Z9882 Breast implant status: Secondary | ICD-10-CM | POA: Diagnosis not present

## 2016-11-09 DIAGNOSIS — S52521S Torus fracture of lower end of right radius, sequela: Secondary | ICD-10-CM

## 2016-11-09 LAB — URINALYSIS, ROUTINE W REFLEX MICROSCOPIC
BILIRUBIN URINE: NEGATIVE
GLUCOSE, UA: NEGATIVE mg/dL
HGB URINE DIPSTICK: NEGATIVE
KETONES UR: NEGATIVE mg/dL
NITRITE: NEGATIVE
PH: 5 (ref 5.0–8.0)
Protein, ur: NEGATIVE mg/dL
SPECIFIC GRAVITY, URINE: 1.024 (ref 1.005–1.030)

## 2016-11-09 LAB — CBC
HCT: 33.7 % — ABNORMAL LOW (ref 36.0–46.0)
HEMOGLOBIN: 11.3 g/dL — AB (ref 12.0–15.0)
MCH: 30.6 pg (ref 26.0–34.0)
MCHC: 33.5 g/dL (ref 30.0–36.0)
MCV: 91.3 fL (ref 78.0–100.0)
PLATELETS: 245 10*3/uL (ref 150–400)
RBC: 3.69 MIL/uL — AB (ref 3.87–5.11)
RDW: 12.3 % (ref 11.5–15.5)
WBC: 12.2 10*3/uL — AB (ref 4.0–10.5)

## 2016-11-09 LAB — TROPONIN I

## 2016-11-09 LAB — BASIC METABOLIC PANEL
ANION GAP: 7 (ref 5–15)
BUN: 13 mg/dL (ref 6–20)
CHLORIDE: 101 mmol/L (ref 101–111)
CO2: 28 mmol/L (ref 22–32)
Calcium: 8.7 mg/dL — ABNORMAL LOW (ref 8.9–10.3)
Creatinine, Ser: 0.85 mg/dL (ref 0.44–1.00)
GFR calc Af Amer: >60 mL/min (ref >60)
Glucose, Bld: 143 mg/dL — ABNORMAL HIGH (ref 65–99)
POTASSIUM: 3.3 mmol/L — AB (ref 3.5–5.1)
SODIUM: 136 mmol/L (ref 135–145)

## 2016-11-09 MED ORDER — MORPHINE SULFATE (PF) 4 MG/ML IV SOLN
2.0000 mg | Freq: Once | INTRAVENOUS | Status: AC
  Administered 2016-11-09: 2 mg via INTRAVENOUS
  Filled 2016-11-09: qty 1

## 2016-11-09 MED ORDER — IOPAMIDOL (ISOVUE-370) INJECTION 76%
100.0000 mL | Freq: Once | INTRAVENOUS | Status: AC | PRN
  Administered 2016-11-09: 100 mL via INTRAVENOUS

## 2016-11-09 MED ORDER — LIDOCAINE HCL 2 % IJ SOLN
20.0000 mL | Freq: Once | INTRAMUSCULAR | Status: AC
  Administered 2016-11-09: 400 mg via INTRADERMAL
  Filled 2016-11-09: qty 20

## 2016-11-09 MED ORDER — HYDROCODONE-ACETAMINOPHEN 5-325 MG PO TABS: 1.0000 | ORAL_TABLET | Freq: Four times a day (QID) | ORAL | 0 refills | Status: DC | PRN

## 2016-11-09 MED ORDER — METOPROLOL TARTRATE 5 MG/5ML IV SOLN
10.0000 mg | Freq: Once | INTRAVENOUS | Status: AC
  Administered 2016-11-09: 10 mg via INTRAVENOUS
  Filled 2016-11-09: qty 10

## 2016-11-09 MED ORDER — ALPRAZOLAM 0.5 MG PO TABS
0.5000 mg | ORAL_TABLET | Freq: Once | ORAL | Status: AC
  Administered 2016-11-09: 0.5 mg via ORAL
  Filled 2016-11-09: qty 1

## 2016-11-09 MED ORDER — LIDOCAINE HCL 2 % IJ SOLN
20.0000 mL | Freq: Once | INTRAMUSCULAR | Status: AC
  Administered 2016-11-09: 400 mg
  Filled 2016-11-09: qty 20

## 2016-11-09 MED ORDER — IOPAMIDOL (ISOVUE-370) INJECTION 76%
INTRAVENOUS | Status: AC
  Filled 2016-11-09: qty 100

## 2016-11-09 MED ORDER — MORPHINE SULFATE (PF) 4 MG/ML IV SOLN
2.0000 mg | Freq: Once | INTRAVENOUS | Status: AC
Start: 2016-11-09 — End: 2016-11-09
  Administered 2016-11-09: 2 mg via INTRAVENOUS
  Filled 2016-11-09: qty 1

## 2016-11-09 NOTE — Discharge Instructions (Addendum)
Discharge Instructions   Move your fingers as much as possible, making a full fist and fully opening the fist. Elevate your hand to reduce pain & swelling of the digits.  Ice over the operative site may be helpful to reduce pain & swelling.  DO NOT USE HEAT. Pain medicine has been prescribed for you.  Leave the splint in place until you return to our office.  You may shower, but keep the bandage clean & dry.  You may drive a car when you are off of prescription pain medications and can safely control your vehicle with both hands. Our office will call you to arrange follow-up   Please call [phone] during normal business hours or [phone] after hours for any problems. Including the following:  - excessive redness of the incisions - drainage for more than 4 days - fever of more than 101.5 F  *Please note that pain medications will not be refilled after hours or on weekends.   Closed Reduction for Wrist or Forearm, Care After Refer to this sheet in the next few weeks. These instructions provide you with information about caring for yourself after your procedure. Your health care provider may also give you more specific instructions. Your treatment has been planned according to current medical practices, but problems sometimes occur. Call your health care provider if you have any problems or questions after your procedure. What can I expect after the procedure? After the procedure, it is common to have:  Pain.  Swelling.  Mild tingling or numbness. Follow these instructions at home: If you have a splint:    Wear the splint as told by your health care provider. Remove it only as told by your health care provider.  Loosen the splint if your fingers tingle, become numb, or turn cold and blue. Do not let the splintget wet if it is not waterproof.  Keep the splint clean. If you have a cast:   Do not stick anything inside the cast to scratch your skin. Doing that increases  your risk of infection.  Check the skin around the cast every day. Tell your health care provider about any concerns.  You may put lotion on dry skin around the edges of the cast. Do not put lotion on the skin underneath the cast.  Do not let your cast get wet if it is not waterproof.  Keep the cast clean. Managing pain, stiffness, and swelling   Put ice in a plastic bag.  Place a towel between your skin and the bag or between your cast and the bag.  Leave the ice on for 20 minutes, 2-3 times per day.  Move your fingers often to avoid stiffness and to lessen swelling.  Raise (elevate) the injured area above the level of your heart while you are sitting or lying down. Driving   Do not drive or use heavy machinery while taking prescription pain medicine.  Do not drive for 24 hours if you received a medicine to help you relax (sedative).  Ask your health care provider when it is safe to drive if you have a cast or splint on your arm. Activity   Return to your normal activities as told by your health care provider. Ask your health care provider what activities are safe for you.  Do exercises as told by your health care provider. General instructions   Do not put pressure on any part of the cast or splint until it is fully hardened. This may take several hours.  Do not use any tobacco products, such as cigarettes, chewing tobacco, and e-cigarettes. These can delay bone healing. If you need help quitting, ask your health care provider.  Take over-the-counter and prescription medicines only as told by your health care provider.  If your cast or splint is not waterproof, cover it with a watertight coveringwhen you take a bath or a shower.  Keep all follow-up visits as told by your health care provider. This is important. Contact a health care provider if:  You have a fever.  You have pain that is not controlled by your pain medicine.  You have new or increasing  numbness. Get help right away if:  You have severe pain.  You have a severe increase in swelling.  Your fingers become very cold or blue.  Your fingers become numb or feel tingly. This information is not intended to replace advice given to you by your health care provider. Make sure you discuss any questions you have with your health care provider. Document Released: 07/24/2015 Document Revised: 01/27/2016 Document Reviewed: 07/24/2015 Elsevier Interactive Patient Education  2017 [employer].

## 2016-11-09 NOTE — ED Notes (Signed)
Patient transported to X-ray 

## 2016-11-09 NOTE — ED Triage Notes (Addendum)
Per EMS pt tripped over cat food landing on hardwood floor resulting in right side pain and right wrist deformity. Pt alert and oriented x4. Pt has towel for spinal immobilization.

## 2016-11-09 NOTE — H&P (Signed)
[name] HQI:[id number] DOB: [birth year] DOA: 11/09/2016     PCP: Woody Seller, MD   Outpatient Specialists: Cardiology [facility]   Patient coming from:    home Lives alone,       Chief Complaint: fall  HPI: [name] is a 82 y.o. female with medical history significant of CAD, Takotsubo syndrome, HTN, Anxiety Hyperlipidemia, and IBS    Presented with fall with unclear etiology Patient stated she tripped over the cat food was found to have wrist deformity She fell asleep in a recliner and tried to get up in the middle of the night family felt she likely got disoriented. She lives alone but have had dementia. When she was found down by her family  At 8 AM and EMS was called. Family denies incontinence.  Denies head injury. No recent illness, no chest pain no Shorttness of breath Plan was for Patient to be discharged to home but she was found to be persistently hypoxic. ER ordered Dissection study which was non acute.  OF note in 2016 patient had a Syncopal event and also found to be transiently hypoxic with negative work up that has resolved.    Regarding pertinent Chronic problems: hx of Takotsubo syndrome 2006 have seen cardiology 1 year ago.    IN ER:  Temp (24hrs), Avg:97.4 F (36.3 C), Min:97.4 F (36.3 C), Max:97.4 F (36.3 C)    RR 18 96% on 2L 87% on RA BP 161/99  WBC 12.2 Hg 11.3 plt 245 Na 136 K 3.3  noted to have a displaced right distal radius fracture Reduced by ER and then Orthopedics  CT angio non acute but motion degrAded. CT head neg  Right Wrist Persistent comminuted fracture in distal right radius with dorsal angulation. Following Medications were ordered in ER: Medications  ALPRAZolam (XANAX) tablet 0.5 mg (not administered)  morphine 4 MG/ML injection 2 mg (2 mg Intravenous Given 11/09/16 1026)  lidocaine (XYLOCAINE) 2 % (with pres) injection 400 mg (400 mg Intradermal Given 11/09/16 1521)  lidocaine (XYLOCAINE) 2 % (with pres) injection 400 mg  (400 mg Infiltration Given 11/09/16 1822)  morphine 4 MG/ML injection 2 mg (2 mg Intravenous Given 11/09/16 1820)  metoprolol (LOPRESSOR) injection 10 mg (10 mg Intravenous Given 11/09/16 1901)  iopamidol (ISOVUE-370) 76 % injection 100 mL (100 mLs Intravenous Contrast Given 11/09/16 2031)     ER provider discussed case with: Orhtopedics  Milly Jakob Will need to follow up as outpatient  Hospitalist was called for admission for hypoxia  Review of Systems:    Pertinent positives include: fall  Constitutional:  No weight loss, night sweats, Fevers, chills, fatigue, weight loss  HEENT:  No headaches, Difficulty swallowing,Tooth/dental problems,Sore throat,  No sneezing, itching, ear ache, nasal congestion, post nasal drip,  Cardio-vascular:  No chest pain, Orthopnea, PND, anasarca, dizziness, palpitations.no Bilateral lower extremity swelling  GI:  No heartburn, indigestion, abdominal pain, nausea, vomiting, diarrhea, change in bowel habits, loss of appetite, melena, blood in stool, hematemesis Resp:  no shortness of breath at rest. No dyspnea on exertion, No excess mucus, no productive cough, No non-productive cough, No coughing up of blood.No change in color of mucus.No wheezing. Skin:  no rash or lesions. No jaundice GU:  no dysuria, change in color of urine, no urgency or frequency. No straining to urinate.  No flank pain.  Musculoskeletal:  No joint pain or no joint swelling. No decreased range of motion. No back pain.  Psych:  No change in mood or  affect. No depression or anxiety. No memory loss.  Neuro: no localizing neurological complaints, no tingling, no weakness, no double vision, no gait abnormality, no slurred speech, no confusion  As per HPI otherwise 10 point review of systems negative.   Past Medical History: Past Medical History:  Diagnosis Date  . Anemia   . Anxiety    confusion  . Blood transfusion    2005 maybe  . Coronary artery disease   . Depression    . Diverticular disease   . Esophageal reflux   . Falls frequently   . Headache(784.0)    takes Prilosec  . Hypertension   . IBS (irritable bowel syndrome)   . Osteoarthrosis, unspecified whether generalized or localized, lower leg   . Other and unspecified hyperlipidemia   . Shortness of breath    when my heart is not doing right  . Takotsubo syndrome    Past Surgical History:  Procedure Laterality Date  . BACK SURGERY    . BREAST RECONSTRUCTION    . CATARACT EXTRACTION W/ INTRAOCULAR LENS  IMPLANT, BILATERAL    . FRACTURE SURGERY    . HIP ARTHROPLASTY Right 06/10/2013   Procedure: ARTHROPLASTY MONOPOLAR HIP CEMENTED;  Surgeon: Marybelle Killings, MD;  Location: [facility];  Service: Orthopedics;  Laterality: Right;  . HYSTEROTOMY    . IMPLANTABLE CONTACT LENS IMPLANTATION    . JOINT REPLACEMENT    . KNEE SURGERY    . KYPHOPLASTY N/A 11/05/2013   Procedure: Jone Baseman TWO;  Surgeon: Ophelia Charter, MD;  Location: [facility];  Service: Neurosurgery;  Laterality: N/A;  . TONSILLECTOMY     as child     Social History:  Ambulatory  walker   But not compliant    reports that she has never smoked. She has never used smokeless tobacco. She reports that she does not drink alcohol or use drugs.  Allergies:   Allergies  Allergen Reactions  . Adhesive [Tape] Other (See Comments)    Sensitive to adhesives, must be removed with adhesive remover due to thin skin.  Octaviano Glow Hives       Family History:   Family History  Problem Relation Age of Onset  . Heart failure Mother   . Stroke Sister   . Lung cancer Brother   . Lung cancer Other     Medications: Prior to Admission medications   Medication Sig Start Date End Date Taking? Authorizing Provider  ALPRAZolam Duanne Moron) 0.5 MG tablet Take 1 tablet (0.5 mg total) by mouth 3 (three) times daily as needed for anxiety or sleep. For anxiety. 06/22/13  Yes Annita Brod, MD  ALPRAZolam Duanne Moron) 0.5  MG tablet TAKE 1 TABLET BY MOUTH 3 TIMES A DAY AS NEEDED FOR ANXIETY 10/30/16  Yes Historical Provider, MD  Calcium Citrate-Vitamin D (CALCIUM + D PO) Take 1 tablet by mouth daily.   Yes Historical Provider, MD  carvedilol (COREG) 3.125 MG tablet TAKE 1 TABLET BY MOUTH TWICE A DAY WITH A MEAL 05/15/16  Yes Sherren Mocha, MD  CRANBERRY PO Take 2 capsules by mouth daily.   Yes Historical Provider, MD  diphenoxylate-atropine (LOMOTIL) 2.5-0.025 MG per tablet Take 1 tablet by mouth 2 (two) times daily.  04/27/14  Yes Historical Provider, MD  Docusate Calcium (STOOL SOFTENER PO) Take 1 tablet by mouth daily as needed (constipation).    Yes Historical Provider, MD  escitalopram (LEXAPRO) 10 MG tablet Take 20 mg by mouth 2 (two) times daily.    Yes  Historical Provider, MD  omeprazole (PRILOSEC) 20 MG capsule Take 20 mg by mouth daily.     Yes Historical Provider, MD  Probiotic Product (PROBIOTIC DAILY PO) Take 1 tablet by mouth daily.   Yes Historical Provider, MD  traMADol (ULTRAM) 50 MG tablet Take 1 tablet (50 mg total) by mouth 3 (three) times daily as needed. Patient taking differently: Take 50 mg by mouth 2 (two) times daily.  04/06/16  Yes Virgel Manifold, MD  vitamin B-12 (CYANOCOBALAMIN) 1000 MCG tablet Take 1,000 mcg by mouth daily.   Yes Historical Provider, MD  aspirin EC 81 MG tablet Take 81 mg by mouth every 6 (six) hours as needed for moderate pain.     Historical Provider, MD  HYDROcodone-acetaminophen (NORCO) 5-325 MG tablet Take 1 tablet by mouth every 6 (six) hours as needed for moderate pain. 11/09/16   Jola Schmidt, MD  nitroGLYCERIN (NITROSTAT) 0.4 MG SL tablet Place 1 tablet (0.4 mg total) under the tongue every 5 (five) minutes as needed. For chest pain. 04/22/13   Sherren Mocha, MD  trolamine salicylate (ASPERCREME) 10 % cream Apply 1 application topically as needed (for back pain).     Historical Provider, MD    Physical Exam: Patient Vitals for the past 24 hrs:  BP Temp Temp src  Pulse Resp SpO2  11/09/16 2115 (!) 161/99 - - 87 18 96 %  11/09/16 1914 - - - - - (!) 87 %  11/09/16 1911 (!) 166/95 - - - - -  11/09/16 1837 (!) 196/110 - - 85 15 93 %  11/09/16 1553 133/84 - - 70 15 98 %  11/09/16 1315 (!) 127/104 - - 84 - (!) 84 %  11/09/16 1230 125/75 - - 70 - 99 %  11/09/16 1220 114/80 - - 75 15 96 %  11/09/16 0913 (!) 151/95 97.4 F (36.3 C) Oral 69 18 93 %  11/09/16 0909 - - - - - (!) 89 %    1. General:  in No Acute distress 2. Psychological: Alert and   Oriented 3. Head/ENT:     Dry Mucous Membranes                          Head Non traumatic, neck supple                            Poor Dentition 4. SKIN: decreased Skin turgor,  Skin clean Dry and intact no rash 5. Heart: Regular rate and rhythm no Murmur, Rub or gallop 6. Lungs:  Clear to auscultation bilaterally, no wheezes or crackles   7. Abdomen: Soft,  non-tender, Non distended 8. Lower extremities: no clubbing, cyanosis, or edema 9. Neurologically Grossly intact, moving all 4 extremities equally  10. MSK: Normal range of motion except right wrist in splint   body mass index is unknown because there is no height or weight on file.  Labs on Admission:   Labs on Admission: I have personally reviewed following labs and imaging studies  CBC:  Recent Labs Lab 11/09/16 1018  WBC 12.2*  HGB 11.3*  HCT 33.7*  MCV 91.3  PLT 161   Basic Metabolic Panel:  Recent Labs Lab 11/09/16 1018  NA 136  K 3.3*  CL 101  CO2 28  GLUCOSE 143*  BUN 13  CREATININE 0.85  CALCIUM 8.7*   GFR: CrCl cannot be calculated (Unknown ideal weight.). Liver Function Tests:  No results for input(s): AST, ALT, ALKPHOS, BILITOT, PROT, ALBUMIN in the last 168 hours. No results for input(s): LIPASE, AMYLASE in the last 168 hours. No results for input(s): AMMONIA in the last 168 hours. Coagulation Profile: No results for input(s): INR, PROTIME in the last 168 hours. Cardiac Enzymes: No results for input(s):  CKTOTAL, CKMB, CKMBINDEX, TROPONINI in the last 168 hours. BNP (last 3 results) No results for input(s): PROBNP in the last 8760 hours. HbA1C: No results for input(s): HGBA1C in the last 72 hours. CBG: No results for input(s): GLUCAP in the last 168 hours. Lipid Profile: No results for input(s): CHOL, HDL, LDLCALC, TRIG, CHOLHDL, LDLDIRECT in the last 72 hours. Thyroid Function Tests: No results for input(s): TSH, T4TOTAL, FREET4, T3FREE, THYROIDAB in the last 72 hours. Anemia Panel: No results for input(s): VITAMINB12, FOLATE, FERRITIN, TIBC, IRON, RETICCTPCT in the last 72 hours. Urine analysis:    Component Value Date/Time   COLORURINE AMBER (A) 11/09/2016 1217   APPEARANCEUR HAZY (A) 11/09/2016 1217   LABSPEC 1.024 11/09/2016 1217   PHURINE 5.0 11/09/2016 1217   GLUCOSEU NEGATIVE 11/09/2016 1217   HGBUR NEGATIVE 11/09/2016 1217   BILIRUBINUR NEGATIVE 11/09/2016 1217   KETONESUR NEGATIVE 11/09/2016 1217   PROTEINUR NEGATIVE 11/09/2016 1217   UROBILINOGEN 0.2 08/12/2014 1901   NITRITE NEGATIVE 11/09/2016 1217   LEUKOCYTESUR TRACE (A) 11/09/2016 1217   Sepsis Labs: @LABRCNTIP (procalcitonin:4,lacticidven:4) )No results found for this or any previous visit (from the past 240 hour(s)).     UA  no evidence of UTI     No results found for: HGBA1C  CrCl cannot be calculated (Unknown ideal weight.).  BNP (last 3 results) No results for input(s): PROBNP in the last 8760 hours.   ECG REPORT ordered  There were no vitals filed for this visit.   Cultures:    Component Value Date/Time   SDES URINE, RANDOM 06/15/2013 1122   SPECREQUEST Immunocompromised 06/15/2013 1122   CULT  06/15/2013 1122    KLEBSIELLA PNEUMONIAE Performed at [facility] 06/16/2013 FINAL 06/15/2013 1122     Radiological Exams on Admission: Dg Ribs Unilateral W/chest Right  Result Date: 11/09/2016 CLINICAL DATA:  Fall at home this morning. Right-sided rib and chest pain.  Initial encounter. EXAM: RIGHT RIBS AND CHEST - 3+ VIEW COMPARISON:  Chest radiograph on 11/21/2014 FINDINGS: No fracture or other bone lesions are seen involving the ribs. There is no evidence of pneumothorax or pleural effusion. Both lungs are clear. Heart size is normal. Stable ectasia of thoracic aorta. Bilateral breast implants noted, as well as lumbar spine fusion hardware and previous upper lumbar vertebroplasty. IMPRESSION: No acute findings. Electronically Signed   By: Earle Gell M.D.   On: 11/09/2016 10:55   Dg Wrist Complete Right  Result Date: 11/09/2016 CLINICAL DATA:  Closed fracture distal radius postreduction EXAM: RIGHT WRIST - COMPLETE 3+ VIEW COMPARISON:  11/09/2016 FINDINGS: Three views of the right wrist submitted. Limited study by casting material artifact. Persistent comminuted fracture in distal right radius with dorsal angulation. No significant change in alignment from prior exam. IMPRESSION: Persistent comminuted fracture in distal right radius with dorsal angulation. No significant change in alignment from prior exam. Electronically Signed   By: Lahoma Crocker M.D.   On: 11/09/2016 17:01   Dg Wrist Complete Right  Result Date: 11/09/2016 CLINICAL DATA:  Postreduction films. EXAM: RIGHT WRIST - COMPLETE 3+ VIEW COMPARISON:  Earlier the same day FINDINGS: Fine bony detail obscured by the overlying plaster.  Fracture the distal radial metaphysis again noted. Bony alignment similar to the pre reduction films. Bones appear demineralized. IMPRESSION: No substantial interval change status post closed reduction. Electronically Signed   By: Misty Stanley M.D.   On: 11/09/2016 14:35   Dg Wrist Complete Right  Result Date: 11/09/2016 CLINICAL DATA:  Fall at home this a.m landing onto rt side complains of rt sided chest pain, rt wrist pain with deformity EXAM: RIGHT WRIST - COMPLETE 3+ VIEW COMPARISON:  None. FINDINGS: Fracture of the distal radial metaphysis with dorsal angulation. Fracture  does not appear to enter the articular surface. Radiocarpal joint is intact. Mild comminution of the fracture. Fracture of the ulnar styloid age-indeterminate. IMPRESSION: 1. Comminuted fracture of the distal radius with dorsal angulation. 2. Radiocarpal joint intact. 3. Age-indeterminate ulnar styloid fracture. Electronically Signed   By: Suzy Bouchard M.D.   On: 11/09/2016 10:55   Ct Head Wo Contrast  Result Date: 11/09/2016 CLINICAL DATA:  Patient presents to the emergency department after tripping over her cat food bowl this morning and landing on her right wrist and right lateral chest. She presents with obvious deformity of her right wrist. She denies head injury or headache at this time. She denies neck pain. She denies recent illness. She is alert and oriented 4. She presents via EMS. She denies hip pain. Her only complaint is focused right wrist pain. She denies neck pain EXAM: CT HEAD WITHOUT CONTRAST CT CERVICAL SPINE WITHOUT CONTRAST TECHNIQUE: Multidetector CT imaging of the head and cervical spine was performed following the standard protocol without intravenous contrast. Multiplanar CT image reconstructions of the cervical spine were also generated. COMPARISON:  11/20/2014 FINDINGS: CT HEAD FINDINGS Brain: No evidence of acute infarction, hemorrhage, hydrocephalus, extra-axial collection or mass lesion/mass effect. Vascular: No hyperdense vessel or unexpected calcification. Skull: Normal. Negative for fracture or focal lesion. Sinuses/Orbits: No acute finding. Other: None. CT CERVICAL SPINE FINDINGS Alignment: Hyperextended but otherwise unremarkable with no spondylolisthesis. Skull base and vertebrae: No acute fracture. No primary bone lesion or focal pathologic process. Soft tissues and spinal canal: No prevertebral fluid or swelling. No visible canal hematoma. Disc levels: Mild to moderate loss disc height at C3-C4. Moderate loss disc height at C5-C6 and C6-C7. Endplate spurring at these  levels. Bilateral facet degenerative change. Bones are demineralized. There are mild areas of disc bulging, but no convincing disc herniation. Upper chest: Apical pleuroparenchymal scarring.  No acute findings. Other: None. IMPRESSION: HEAD CT:  No acute intracranial abnormalities.  No skull fracture. CERVICAL CT:  No fracture or acute finding. Electronically Signed   By: Lajean Manes M.D.   On: 11/09/2016 17:54   Ct Cervical Spine Wo Contrast  Result Date: 11/09/2016 CLINICAL DATA:  Patient presents to the emergency department after tripping over her cat food bowl this morning and landing on her right wrist and right lateral chest. She presents with obvious deformity of her right wrist. She denies head injury or headache at this time. She denies neck pain. She denies recent illness. She is alert and oriented 4. She presents via EMS. She denies hip pain. Her only complaint is focused right wrist pain. She denies neck pain EXAM: CT HEAD WITHOUT CONTRAST CT CERVICAL SPINE WITHOUT CONTRAST TECHNIQUE: Multidetector CT imaging of the head and cervical spine was performed following the standard protocol without intravenous contrast. Multiplanar CT image reconstructions of the cervical spine were also generated. COMPARISON:  11/20/2014 FINDINGS: CT HEAD FINDINGS Brain: No evidence of acute infarction, hemorrhage, hydrocephalus,  extra-axial collection or mass lesion/mass effect. Vascular: No hyperdense vessel or unexpected calcification. Skull: Normal. Negative for fracture or focal lesion. Sinuses/Orbits: No acute finding. Other: None. CT CERVICAL SPINE FINDINGS Alignment: Hyperextended but otherwise unremarkable with no spondylolisthesis. Skull base and vertebrae: No acute fracture. No primary bone lesion or focal pathologic process. Soft tissues and spinal canal: No prevertebral fluid or swelling. No visible canal hematoma. Disc levels: Mild to moderate loss disc height at C3-C4. Moderate loss disc height at C5-C6  and C6-C7. Endplate spurring at these levels. Bilateral facet degenerative change. Bones are demineralized. There are mild areas of disc bulging, but no convincing disc herniation. Upper chest: Apical pleuroparenchymal scarring.  No acute findings. Other: None. IMPRESSION: HEAD CT:  No acute intracranial abnormalities.  No skull fracture. CERVICAL CT:  No fracture or acute finding. Electronically Signed   By: Lajean Manes M.D.   On: 11/09/2016 17:54   Ct Angio Chest/abd/pel For Dissection W And/or Wo Contrast  Result Date: 11/09/2016 CLINICAL DATA:  Syncope with hypertension and hypoxia. Evaluate for aortic dissection. EXAM: CT ANGIOGRAPHY CHEST, ABDOMEN AND PELVIS TECHNIQUE: Multidetector CT imaging through the chest, abdomen and pelvis was performed using the standard protocol during bolus administration of intravenous contrast. Multiplanar reconstructed images and MIPs were obtained and reviewed to evaluate the vascular anatomy. CONTRAST:  100 CC Isovue 370 COMPARISON:  None. FINDINGS: CTA CHEST FINDINGS Study is markedly motion degraded. Cardiovascular: Precontrast imaging shows no hyperdense crescent in the wall of the thoracic aorta to suggest acute intramural hematoma. Ascending thoracic aorta measures approximately 3.3 cm diameter. Within the limitation of 9 gated technique and substantial patient motion, no definite dissection flap is identified in the thoracic aorta. Heart size is normal. No pericardial effusion. No large central pulmonary embolus in the main pulmonary outflow tract or either main pulmonary artery. Mediastinum/Nodes: No mediastinal lymphadenopathy. There is no hilar lymphadenopathy. There is no axillary lymphadenopathy. Lungs/Pleura: No focal airspace consolidation. No overt pulmonary edema. No substantial pleural effusion. Given the extreme motion artifact, small pulmonary nodules could be obscured. Musculoskeletal: No gross lytic or sclerotic osseous abnormality is identified.  Assessment of bony anatomy for fracture is markedly limited by the motion artifact. Review of the MIP images confirms the above findings. CTA ABDOMEN AND PELVIS FINDINGS VASCULAR Aorta: No thoracic aortic aneurysm. Celiac: Patent. Assessment for stenosis not possible due to motion artifact. SMA: Patent. Assessment for proximal stenosis not possible due to motion artifact. Renals: Patent bilaterally. Accessory left renal artery noted. Fine detail obscured by motion artifact. IMA: Not visualized but there is substantial motion artifact in this region. Veins: Portal vein and superior mesenteric vein are patent. IVC appears patent although assessment is degraded by streak artifact from spinal fixation hardware and motion artifact. Review of the MIP images confirms the above findings. NON-VASCULAR Hepatobiliary: No gross abnormality in the liver parenchyma. There is no evidence for gallstones, gallbladder wall thickening, or pericholecystic fluid. No intrahepatic or extrahepatic biliary dilation. Pancreas: Limited assessment due to motion without focal abnormality identified. No dilatation of the main pancreatic duct. Spleen: Unremarkable. Adrenals/Urinary Tract: No adrenal nodule or mass. No hydronephrosis or gross renal mass evident. No evidence for hydroureter. Bladder is obscured by streak artifact from right hip replacement. Stomach/Bowel: Stomach is nondistended. Duodenum not well evaluated. No evidence for small bowel obstruction. No evidence for colonic obstruction. Bowel extends into a right groin hernia but cannot be clearly discerned given streak artifact from the hip replacement and patient motion. Lymphatic: No gross lymphadenopathy  can be identified in the abdomen or pelvis although right pelvic sidewall not well evaluated. Reproductive: Obscured. Other: No substantial intraperitoneal free fluid. Musculoskeletal: Diffuse demineralization. Right hip replacement. Old inferior right pubic ramus fracture.  Status post lower lumbar fusion. Multilevel thoracolumbar compression fractures. Review of the MIP images confirms the above findings. IMPRESSION: 1. Study is markedly degraded by patient motion throughout image acquisition imaging through the pelvis further degraded by lumbar fusion hardware and right hip replacement. 2. Within the above-stated limitation, no definite dissection is identified in the thoraco abdominal aorta. There is no thoracoabdominal aortic aneurysm. 3. Right groin hernia contains bowel but is largely obscured by streak artifact and motion artifact. No findings to suggest bowel obstruction at this time. 4. Diffuse bony demineralization with multiple thoracolumbar compression fractures. These are age-indeterminate and cannot be further evaluated given the marked motion degradation. Electronically Signed   By: Misty Stanley M.D.   On: 11/09/2016 21:25    Chart has been reviewed    Assessment/Plan  82 y.o. female with medical history significant of CAD, Takotsubo syndrome, HTN, Anxiety Hyperlipidemia, and IBS admitted for fall and hypoxia  Present on Admission: *Fracture will need to follow-up with orthopedics continue splint PT OT evaluation and pain management . Fall will have PT OT evaluation prior to discharge for safety . Hypokalemia  - will replace check magnesium level . Hypoxia - this is recurrent event possibly medication related. Patient had had episodes of hypoxia in the past which were unexplained during hospitalization denies any recent coughing or shortness of breath or chest pain continue to observe May require home oxygen. No evidence of PE or pulmonary process on his chest CT . CAD (coronary artery disease) stable continue home medications  . Syncope sure etiology of fall unwitnessed patient with dementia and unable to provide detailed history family is unsure. We'll monitor on telemetry cycle cardiac enzymes given advanced age . GERD stable continue home  medications . Dementia - expect some degree of sundowning family is worried the patient unable to take care of herself at home . Debility - will need PT OT evaluation for safety at discharge . Leucocytosis - likely stress reaction currently no evidence of infectious process . Essential hypertension -  stable and not on antihypertensives at home likely elevated blood pressure secondary to pain we'll control and monitor  Other plan as per orders.  DVT prophylaxis:  Heparin [place]   Code Status:  FULL CODE  as per   family   Family Communication:   Family   at  Bedside  plan of care was discussed with  Daughter  Disposition Plan:   Will need to be placement                Would benefit from PT/OT eval prior to DC   ordered                       Social Work                          Consults called: orthopedics aware  Admission status:  obs   Level of care     tele         I have spent a total of 56 min on this admission   Giovany Cosby 11/10/2016, 1:09 AM    [facility]  Pager [phone]   after 2 AM please page floor coverage PA If 7AM-7PM, please contact the  day team taking care of the patient  [email or web address]  Password [email or web address]

## 2016-11-09 NOTE — ED Notes (Signed)
Bed: WA14 Expected date:  Expected time:  Means of arrival:  Comments: Ems fall 

## 2016-11-09 NOTE — ED Notes (Signed)
Called unit and spoke to charge nurse. She said she was working on getting bed approve

## 2016-11-09 NOTE — ED Provider Notes (Signed)
  Physical Exam  BP (!) 161/99   Pulse 87   Temp 97.4 F (36.3 C) (Oral)   Resp 18   SpO2 96%   Physical Exam  ED Course  Procedures  MDM Care assumed at 4 pm from Dr. Venora Maples. Patient fell and had R wrist fracture that was reduced. Had hematoma back of her head and CT head/neck pending at sign out. He discussed with case management and family regarding getting home care.   7 pm CT head/neck unremarkable. I went to update family and noticed that she is persistently hypoxic to 87%. She has obvious bruising R scapula and is hypertensive. cxr unremarkable. Consider pulmonary contusion vs dissection causing syncope. Given lopressor. Will order dissection study.   9:42 PM Still hypoxic. Dissection study neg, just previous compression fractures. Placed on oxygen. Hospitalist to admit.     Desiree Freeze, MD 11/09/16 [phone]

## 2016-11-09 NOTE — Consult Note (Addendum)
ORTHOPAEDIC CONSULTATION HISTORY & PHYSICAL REQUESTING PHYSICIAN: Jola Schmidt, MD  Chief Complaint: Right wrist injury  HPI: [name] is a 82 y.o. female who lives alone independently, and fell onto an outstretched hand at home today.  She was evaluated in the emergency department, where she was noted to have a displaced right distal radius fracture.  First attempted reduction in the ED was not successful in improving alignment.  Past Medical History:  Diagnosis Date  . Anemia   . Anxiety    confusion  . Blood transfusion    2005 maybe  . Coronary artery disease   . Depression   . Diverticular disease   . Esophageal reflux   . Falls frequently   . Headache(784.0)    takes Prilosec  . Hypertension   . IBS (irritable bowel syndrome)   . Osteoarthrosis, unspecified whether generalized or localized, lower leg   . Other and unspecified hyperlipidemia   . Shortness of breath    when my heart is not doing right  . Takotsubo syndrome    Past Surgical History:  Procedure Laterality Date  . BACK SURGERY    . BREAST RECONSTRUCTION    . CATARACT EXTRACTION W/ INTRAOCULAR LENS  IMPLANT, BILATERAL    . FRACTURE SURGERY    . HIP ARTHROPLASTY Right 06/10/2013   Procedure: ARTHROPLASTY MONOPOLAR HIP CEMENTED;  Surgeon: Marybelle Killings, MD;  Location: [facility];  Service: Orthopedics;  Laterality: Right;  . HYSTEROTOMY    . IMPLANTABLE CONTACT LENS IMPLANTATION    . JOINT REPLACEMENT    . KNEE SURGERY    . KYPHOPLASTY N/A 11/05/2013   Procedure: Jone Baseman TWO;  Surgeon: Ophelia Charter, MD;  Location: [facility];  Service: Neurosurgery;  Laterality: N/A;  . TONSILLECTOMY     as child   Social History   Social History  . Marital status: Widowed    Spouse name: N/A  . Number of children: N/A  . Years of education: N/A   Social History Main Topics  . Smoking status: Never Smoker  . Smokeless tobacco: Never Used  . Alcohol use No  . Drug use: No  . Sexual activity:  No   Other Topics Concern  . None   Social History Narrative    Lives locally with her husband.  While she herself does not    smoke, she reports she had substantial exposure to second hand smoke during her adult life.   No family history on file. Allergies  Allergen Reactions  . Adhesive [Tape] Other (See Comments)    Sensitive to adhesives, must be removed with adhesive remover due to thin skin.  Octaviano Glow Hives   Prior to Admission medications   Medication Sig Start Date End Date Taking? Authorizing Provider  ALPRAZolam Duanne Moron) 0.5 MG tablet Take 1 tablet (0.5 mg total) by mouth 3 (three) times daily as needed for anxiety or sleep. For anxiety. 06/22/13   Annita Brod, MD  aspirin EC 81 MG tablet Take 81 mg by mouth daily.    Historical Provider, MD  Calcium Citrate-Vitamin D (CALCIUM + D PO) Take 1 tablet by mouth daily.    Historical Provider, MD  carvedilol (COREG) 3.125 MG tablet TAKE 1 TABLET BY MOUTH TWICE A DAY WITH A MEAL 05/15/16   Sherren Mocha, MD  diphenoxylate-atropine (LOMOTIL) 2.5-0.025 MG per tablet Take 1 tablet by mouth 4 (four) times daily as needed for diarrhea or loose stools.  04/27/14   Historical Provider, MD  Docusate Calcium (STOOL SOFTENER PO) Take 1 tablet by mouth as needed.    Historical Provider, MD  escitalopram (LEXAPRO) 10 MG tablet Take 20 mg by mouth 2 (two) times daily.     Historical Provider, MD  HYDROcodone-acetaminophen (NORCO/VICODIN) 5-325 MG per tablet Take 1-2 tablets by mouth every 4 (four) hours as needed for moderate pain. 11/06/13   Newman Pies, MD  nitroGLYCERIN (NITROSTAT) 0.4 MG SL tablet Place 1 tablet (0.4 mg total) under the tongue every 5 (five) minutes as needed. For chest pain. 04/22/13   Sherren Mocha, MD  omeprazole (PRILOSEC) 20 MG capsule Take 20 mg by mouth daily.      Historical Provider, MD  Probiotic Product (PROBIOTIC DAILY PO) Take 1 tablet by mouth daily.    Historical Provider, MD    traMADol (ULTRAM) 50 MG tablet Take 1 tablet (50 mg total) by mouth 3 (three) times daily as needed. 04/06/16   Virgel Manifold, MD  trolamine salicylate (ASPERCREME) 10 % cream Apply 1 application topically as needed (for back pain).     Historical Provider, MD  vitamin B-12 (CYANOCOBALAMIN) 1000 MCG tablet Take 1,000 mcg by mouth daily.    Historical Provider, MD   Dg Ribs Unilateral W/chest Right  Result Date: 11/09/2016 CLINICAL DATA:  Fall at home this morning. Right-sided rib and chest pain. Initial encounter. EXAM: RIGHT RIBS AND CHEST - 3+ VIEW COMPARISON:  Chest radiograph on 11/21/2014 FINDINGS: No fracture or other bone lesions are seen involving the ribs. There is no evidence of pneumothorax or pleural effusion. Both lungs are clear. Heart size is normal. Stable ectasia of thoracic aorta. Bilateral breast implants noted, as well as lumbar spine fusion hardware and previous upper lumbar vertebroplasty. IMPRESSION: No acute findings. Electronically Signed   By: Earle Gell M.D.   On: 11/09/2016 10:55   Dg Wrist Complete Right  Result Date: 11/09/2016 CLINICAL DATA:  Postreduction films. EXAM: RIGHT WRIST - COMPLETE 3+ VIEW COMPARISON:  Earlier the same day FINDINGS: Fine bony detail obscured by the overlying plaster. Fracture the distal radial metaphysis again noted. Bony alignment similar to the pre reduction films. Bones appear demineralized. IMPRESSION: No substantial interval change status post closed reduction. Electronically Signed   By: Misty Stanley M.D.   On: 11/09/2016 14:35   Dg Wrist Complete Right  Result Date: 11/09/2016 CLINICAL DATA:  Fall at home this a.m landing onto rt side complains of rt sided chest pain, rt wrist pain with deformity EXAM: RIGHT WRIST - COMPLETE 3+ VIEW COMPARISON:  None. FINDINGS: Fracture of the distal radial metaphysis with dorsal angulation. Fracture does not appear to enter the articular surface. Radiocarpal joint is intact. Mild comminution of the  fracture. Fracture of the ulnar styloid age-indeterminate. IMPRESSION: 1. Comminuted fracture of the distal radius with dorsal angulation. 2. Radiocarpal joint intact. 3. Age-indeterminate ulnar styloid fracture. Electronically Signed   By: Suzy Bouchard M.D.   On: 11/09/2016 10:55    Positive ROS: All other systems have been reviewed and were otherwise negative with the exception of those mentioned in the HPI and as above.  Physical Exam: Vitals: Refer to EMR. Constitutional:  WD, WN, NAD HEENT:  NCAT, EOMI Neuro/Psych:  Alert & oriented to person, place, and time; appropriate mood & affect Lymphatic: No generalized extremity edema or lymphadenopathy Extremities / MSK:  The extremities are normal with respect to appearance, ranges of motion, joint stability, muscle strength/tone, sensation, & perfusion except as otherwise noted:  Right wrist is obviously dorsally  displaced.  Intact light touch sensibility in the radial, median, and ulnar nerve distributions with intact motor to the same.  There is bruising on the dorsum of the wrist, no tenderness about the elbow or proximally.  Assessment: Right extra-articular metaphyseal distal radius fracture with dorsal tilt and translational displacement  Plan: I discussed these findings with her.  I discussed the need for provisional reduction using hematoma block, which I performed.  Consent was obtained.  Gentle manipulative reduction was performed followed by application of sugar tong splint.  Improvement in alignment and angulation was evident grossly.  Will give postreduction x-rays.  No change in neurovascular exam postreduction.  My office will contact her to return later next week for new x-rays, including an inclined lateral, and determination whether to continue with closed treatment versus the merits of operative treatment.  Dr. Venora Maples to address disposition and pain meds.   ADDENDUM 11-10-16 0444 Post-reduction xrays reveal improvement  of dorsal tilt by 14 degrees, with better sagittal plane axial alignment. Will plan to re-eval this week and further discuss operative vs. Non-operative options, pros/cons, etc with patient and family.  Rayvon Char [address] [address], [place], [place]  [address] Office: [phone] Mobile: [phone]  11/09/2016, 3:44 PM

## 2016-11-09 NOTE — Progress Notes (Signed)
Spoke to family and staff nurse patient with confusion and broken right  wrist plan was to discharge home with HHPT, OT, RN, AIDE AND SW. Spoke to [place] and made aware of need for [facility] services  Upon speaking with daughter she stated that she needed assistance with her mother tonight, and home health agency was fine but she didn't have 1 to 2 days to wait for someone to call and set up home services.  Also made aware of the plan to have [place] to bring  platform for walker to hospital tomorrow where they could pick it up, after further discussion she stated she would go to the [employer] store and pick up the equipment it was close to her and they had done this before with  another family member.  Explained the difference between [facility] and Private duty Agencies, she stated that she would like a list of [employer], the list was provided. Spoke with [employer] delivery person stated not available at this time due to being off and that he could deliver the [facility] when he brought the Platform for walker . Faxed information to [place].  Upon returning to inform family that information had been faxed, they stated patient was now going to be admitted and they felt like she would need to go to a nursing home.  They had already spoken to their son who worked at [employer] place who stated they could take patient tonight, and wanted arrangements made even called the [facility] ED Care Manager was actually on phone with her when I returned to ED, informed if they decided to do that there maybe co payments due to not being in the hospital for 3 days.  Daughter stated that she would not due that because patient's oxygen level was dropping and she understood the need for admission.  Explained that  after admission an part of  discharge planning they could make arrangements for the Private duty Agency or the [place] which ever they preferred. SW consult placed .    No other CM needs  at this  time.    Rayburn Ma RN, BSN, CM

## 2016-11-09 NOTE — ED Provider Notes (Signed)
[facility] DEPT Provider Note   CSN: [id number] Arrival date & time: 11/09/16  0901     History   Chief Complaint Chief Complaint  Patient presents with  . Fall  . Wrist Deformity    right  . Right Side Pain    HPI [name] is a 82 y.o. female.  HPI Patient presents to the emergency department after tripping over her cat food bowl this morning and landing on her right wrist and right lateral chest.  She presents with obvious deformity of her right wrist.  She denies head injury or headache at this time.  She denies neck pain.  She denies recent illness.  She is alert and oriented 4.  She presents via EMS.  She denies hip pain.  Her only complaint is focused right wrist pain.  She denies neck pain    Past Medical History:  Diagnosis Date  . Anemia   . Anxiety    confusion  . Blood transfusion    2005 maybe  . Coronary artery disease   . Depression   . Diverticular disease   . Esophageal reflux   . Falls frequently   . Headache(784.0)    takes Prilosec  . Hypertension   . IBS (irritable bowel syndrome)   . Osteoarthrosis, unspecified whether generalized or localized, lower leg   . Other and unspecified hyperlipidemia   . Shortness of breath    when my heart is not doing right  . Takotsubo syndrome     Patient Active Problem List   Diagnosis Date Noted  . Anxiety state 11/23/2014  . Generalized anxiety disorder 11/22/2014  . Chronic diarrhea 11/22/2014  . Positive D dimer   . Essential hypertension 11/21/2014  . CAD (coronary artery disease) 11/21/2014  . Fall   . Acute respiratory failure, unspecified whether with hypoxia or hypercapnia ([place])   . Syncope 11/20/2014  . Lumbar compression fracture ([place]) 11/05/2013  . Thrombocytosis ([place]) 06/21/2013  . Acute delirium 06/15/2013  . Hypokalemia 06/14/2013  . Acute blood loss anemia 06/11/2013  . Protein-calorie malnutrition, severe ([place]) 06/10/2013  . Dehydration 06/09/2013  . GAD (generalized  anxiety disorder) 06/09/2013  . HYPERLIPIDEMIA 05/25/2009  . TAKOTSUBO SYNDROME 05/25/2009  . GERD 05/25/2009  . DIVERTICULAR DISEASE 05/25/2009  . OSTEOARTHRITIS, KNEE, RIGHT 05/25/2009  . IRRITABLE BOWEL SYNDROME, HX OF 05/25/2009    Past Surgical History:  Procedure Laterality Date  . BACK SURGERY    . BREAST RECONSTRUCTION    . CATARACT EXTRACTION W/ INTRAOCULAR LENS  IMPLANT, BILATERAL    . FRACTURE SURGERY    . HIP ARTHROPLASTY Right 06/10/2013   Procedure: ARTHROPLASTY MONOPOLAR HIP CEMENTED;  Surgeon: Marybelle Killings, MD;  Location: [facility];  Service: Orthopedics;  Laterality: Right;  . HYSTEROTOMY    . IMPLANTABLE CONTACT LENS IMPLANTATION    . JOINT REPLACEMENT    . KNEE SURGERY    . KYPHOPLASTY N/A 11/05/2013   Procedure: Jone Baseman TWO;  Surgeon: Ophelia Charter, MD;  Location: [facility];  Service: Neurosurgery;  Laterality: N/A;  . TONSILLECTOMY     as child    OB History    No data available       Home Medications    Prior to Admission medications   Medication Sig Start Date End Date Taking? Authorizing Provider  ALPRAZolam Duanne Moron) 0.5 MG tablet Take 1 tablet (0.5 mg total) by mouth 3 (three) times daily as needed for anxiety or sleep. For anxiety. 06/22/13   Sendil K  [place] Pink, MD  aspirin EC 81 MG tablet Take 81 mg by mouth daily.    Historical Provider, MD  Calcium Citrate-Vitamin D (CALCIUM + D PO) Take 1 tablet by mouth daily.    Historical Provider, MD  carvedilol (COREG) 3.125 MG tablet TAKE 1 TABLET BY MOUTH TWICE A DAY WITH A MEAL 05/15/16   Sherren Mocha, MD  diphenoxylate-atropine (LOMOTIL) 2.5-0.025 MG per tablet Take 1 tablet by mouth 4 (four) times daily as needed for diarrhea or loose stools.  04/27/14   Historical Provider, MD  Docusate Calcium (STOOL SOFTENER PO) Take 1 tablet by mouth as needed.    Historical Provider, MD  escitalopram (LEXAPRO) 10 MG tablet Take 20 mg by mouth 2 (two) times daily.     Historical Provider, MD    HYDROcodone-acetaminophen (NORCO) 5-325 MG tablet Take 1 tablet by mouth every 6 (six) hours as needed for moderate pain. 11/09/16   Jola Schmidt, MD  nitroGLYCERIN (NITROSTAT) 0.4 MG SL tablet Place 1 tablet (0.4 mg total) under the tongue every 5 (five) minutes as needed. For chest pain. 04/22/13   Sherren Mocha, MD  omeprazole (PRILOSEC) 20 MG capsule Take 20 mg by mouth daily.      Historical Provider, MD  Probiotic Product (PROBIOTIC DAILY PO) Take 1 tablet by mouth daily.    Historical Provider, MD  traMADol (ULTRAM) 50 MG tablet Take 1 tablet (50 mg total) by mouth 3 (three) times daily as needed. 04/06/16   Virgel Manifold, MD  trolamine salicylate (ASPERCREME) 10 % cream Apply 1 application topically as needed (for back pain).     Historical Provider, MD  vitamin B-12 (CYANOCOBALAMIN) 1000 MCG tablet Take 1,000 mcg by mouth daily.    Historical Provider, MD    Family History No family history on file.  Social History Social History  Substance Use Topics  . Smoking status: Never Smoker  . Smokeless tobacco: Never Used  . Alcohol use No     Allergies   Adhesive [tape] and Sulfamethoxazole-trimethoprim   Review of Systems Review of Systems  All other systems reviewed and are negative.    Physical Exam Updated Vital Signs BP 133/84   Pulse 70   Temp 97.4 F (36.3 C) (Oral)   Resp 15   SpO2 98%   Physical Exam  Constitutional: She is oriented to person, place, and time. She appears well-developed and well-nourished.  HENT:  Head: Normocephalic.  Eyes: EOM are normal.  Neck: Neck supple.  No C-spine tenderness  Cardiovascular: Normal rate.   Pulmonary/Chest: Effort normal and breath sounds normal. She has no wheezes. She has no rales. She exhibits no tenderness.  Abdominal: Soft. She exhibits no distension. There is no tenderness. There is no guarding.  Musculoskeletal:  Full range of motion bilateral hips,, knees, ankles.  Full range of motion bilateral shoulders,  elbows.  Full range motion left wrist.  Limited range of motion right wrist secondary to obvious deformity consistent with dorsally angulated distal right radius fracture.  Normal right radial pulse.  Obvious bruising and swelling.  No open component  Neurological: She is alert and oriented to person, place, and time.  Skin: Skin is warm.  Psychiatric: She has a normal mood and affect.  Nursing note and vitals reviewed.    ED Treatments / Results  Labs (all labs ordered are listed, but only abnormal results are displayed) Labs Reviewed  CBC - Abnormal; Notable for the following:       Result Value  WBC 12.2 (*)    RBC 3.69 (*)    Hemoglobin 11.3 (*)    HCT 33.7 (*)    All other components within normal limits  BASIC METABOLIC PANEL - Abnormal; Notable for the following:    Potassium 3.3 (*)    Glucose, Bld 143 (*)    Calcium 8.7 (*)    All other components within normal limits  URINALYSIS, ROUTINE W REFLEX MICROSCOPIC - Abnormal; Notable for the following:    Color, Urine AMBER (*)    APPearance HAZY (*)    Leukocytes, UA TRACE (*)    Bacteria, UA FEW (*)    Squamous Epithelial / LPF 0-5 (*)    All other components within normal limits    EKG  EKG Interpretation None       Radiology  Dg Ribs Unilateral W/chest Right  Result Date: 11/09/2016 CLINICAL DATA:  Fall at home this morning. Right-sided rib and chest pain. Initial encounter. EXAM: RIGHT RIBS AND CHEST - 3+ VIEW COMPARISON:  Chest radiograph on 11/21/2014 FINDINGS: No fracture or other bone lesions are seen involving the ribs. There is no evidence of pneumothorax or pleural effusion. Both lungs are clear. Heart size is normal. Stable ectasia of thoracic aorta. Bilateral breast implants noted, as well as lumbar spine fusion hardware and previous upper lumbar vertebroplasty. IMPRESSION: No acute findings. Electronically Signed   By: Earle Gell M.D.   On: 11/09/2016 10:55   Dg Wrist Complete Right  Result Date:  11/09/2016 CLINICAL DATA:  Postreduction films. EXAM: RIGHT WRIST - COMPLETE 3+ VIEW COMPARISON:  Earlier the same day FINDINGS: Fine bony detail obscured by the overlying plaster. Fracture the distal radial metaphysis again noted. Bony alignment similar to the pre reduction films. Bones appear demineralized. IMPRESSION: No substantial interval change status post closed reduction. Electronically Signed   By: Misty Stanley M.D.   On: 11/09/2016 14:35   Dg Wrist Complete Right  Result Date: 11/09/2016 CLINICAL DATA:  Fall at home this a.m landing onto rt side complains of rt sided chest pain, rt wrist pain with deformity EXAM: RIGHT WRIST - COMPLETE 3+ VIEW COMPARISON:  None. FINDINGS: Fracture of the distal radial metaphysis with dorsal angulation. Fracture does not appear to enter the articular surface. Radiocarpal joint is intact. Mild comminution of the fracture. Fracture of the ulnar styloid age-indeterminate. IMPRESSION: 1. Comminuted fracture of the distal radius with dorsal angulation. 2. Radiocarpal joint intact. 3. Age-indeterminate ulnar styloid fracture. Electronically Signed   By: Suzy Bouchard M.D.   On: 11/09/2016 10:55    Procedures Reduction of fracture Performed by: Jola Schmidt Authorized by: Jola Schmidt    Consent: Verbal consent obtained. Risks and benefits: risks, benefits and alternatives were discussed Consent given by: patient Required items: required blood products, implants, devices, and special equipment available Time out: Immediately prior to procedure a "time out" was called to verify the correct patient, procedure, equipment, support staff and site/side marked as required. Patient sedated: no HEMATOMA BLOCK with 10 cc of lidocaine 2% Vitals: Vital signs were monitored during sedation. Patient tolerance: Patient tolerated the procedure well with no immediate complications. Bone: Distral right radius fracture Reduction technique: manipulation Unsuccessful  reduction of fracture fragments        Medications Ordered in ED Medications  lidocaine (XYLOCAINE) 2 % (with pres) injection 400 mg (not administered)  morphine 4 MG/ML injection 2 mg (2 mg Intravenous Given 11/09/16 1026)  lidocaine (XYLOCAINE) 2 % (with pres) injection 400 mg (400 mg  Intradermal Given 11/09/16 1521)     Initial Impression / Assessment and Plan / ED Course  I have reviewed the triage vital signs and the nursing notes.  Pertinent labs & imaging results that were available during my care of the patient were reviewed by me and considered in my medical decision making (see chart for details).     Unsuccessful reduction of fracture.  I spoke with Dr. Grand[place] Silos of hand surgery who performed reduction of the bedside.  He will follow-up the patient as an outpatient.  She will undergo head CT at this time.  Otherwise she is frail and not doing well at home.  Labs and vital signs without significant abnormality here.  There are reported O2 sats of 84% but these are non-accurate as I sat at the bedside and O2 sats of 94% on room air.  Patient will be discharged home at this time.  I discussed her case with Mariann Laster with case management who will get in touch with the family and provide home health PT, OT, RN, aide, social work.  I had a long discussion with family in regards to inability to admit the patient the hospital or place the patient in a skilled nursing facility from the emergency department.  I have asked the family to stay with her at home or provide private duty nursing.  Final Clinical Impressions(s) / ED Diagnoses   Final diagnoses:  Fall, initial encounter  Closed Colles' fracture of right radius, initial encounter    New Prescriptions New Prescriptions   HYDROCODONE-ACETAMINOPHEN (NORCO) 5-325 MG TABLET    Take 1 tablet by mouth every 6 (six) hours as needed for moderate pain.     Jola Schmidt, MD 11/09/16 [phone]

## 2016-11-09 NOTE — Progress Notes (Signed)
[place] about change in need of service due to patient's admission, message left with answering service.    Rayburn Ma RN, BSN, CM

## 2016-11-10 ENCOUNTER — Observation Stay (HOSPITAL_BASED_OUTPATIENT_CLINIC_OR_DEPARTMENT_OTHER): Payer: Medicare Other

## 2016-11-10 DIAGNOSIS — F411 Generalized anxiety disorder: Secondary | ICD-10-CM | POA: Diagnosis not present

## 2016-11-10 DIAGNOSIS — E785 Hyperlipidemia, unspecified: Secondary | ICD-10-CM | POA: Diagnosis not present

## 2016-11-10 DIAGNOSIS — I5032 Chronic diastolic (congestive) heart failure: Secondary | ICD-10-CM | POA: Diagnosis present

## 2016-11-10 DIAGNOSIS — R0902 Hypoxemia: Secondary | ICD-10-CM

## 2016-11-10 DIAGNOSIS — D62 Acute posthemorrhagic anemia: Secondary | ICD-10-CM | POA: Diagnosis not present

## 2016-11-10 DIAGNOSIS — R55 Syncope and collapse: Secondary | ICD-10-CM | POA: Diagnosis not present

## 2016-11-10 DIAGNOSIS — Z7189 Other specified counseling: Secondary | ICD-10-CM

## 2016-11-10 DIAGNOSIS — K589 Irritable bowel syndrome without diarrhea: Secondary | ICD-10-CM | POA: Diagnosis not present

## 2016-11-10 DIAGNOSIS — D72829 Elevated white blood cell count, unspecified: Secondary | ICD-10-CM | POA: Diagnosis present

## 2016-11-10 DIAGNOSIS — S52521S Torus fracture of lower end of right radius, sequela: Secondary | ICD-10-CM | POA: Diagnosis not present

## 2016-11-10 DIAGNOSIS — S52531A Colles' fracture of right radius, initial encounter for closed fracture: Secondary | ICD-10-CM | POA: Diagnosis not present

## 2016-11-10 LAB — BASIC METABOLIC PANEL WITH GFR
Anion gap: 9 (ref 5–15)
BUN: 11 mg/dL (ref 6–20)
CO2: 23 mmol/L (ref 22–32)
Calcium: 8.5 mg/dL — ABNORMAL LOW (ref 8.9–10.3)
Chloride: 103 mmol/L (ref 101–111)
Creatinine, Ser: 0.66 mg/dL (ref 0.44–1.00)
GFR calc Af Amer: >60 mL/min
GFR calc non Af Amer: >60 mL/min
Glucose, Bld: 139 mg/dL — ABNORMAL HIGH (ref 65–99)
Potassium: 3.6 mmol/L (ref 3.5–5.1)
Sodium: 135 mmol/L (ref 135–145)

## 2016-11-10 LAB — TROPONIN I
TROPONIN I: 0.03 ng/mL — AB (ref <0.03)
TROPONIN I: 0.03 ng/mL — AB (ref <0.03)

## 2016-11-10 LAB — ECHOCARDIOGRAM COMPLETE
Ao-asc: 32 cm
E decel time: 211 ms
E/e' ratio: 6.18
FS: 23 % — AB (ref 28–44)
IVS/LV PW RATIO, ED: 1.24
LA ID, A-P, ES: 30 mm
LA diam end sys: 30 mm
LA diam index: 1.96 cm/m2
LA vol A4C: 25.9 mL
LV E/e' medial: 6.18
LV E/e'average: 6.18
LV PW d: 9.82 mm — AB (ref 0.6–1.1)
LV e' LATERAL: 7.72 cm/s
LVOT area: 2.27 cm2
LVOT diameter: 17 mm
Lateral S' vel: 11.4 cm/s
MV Dec: 211
MV pk A vel: 73.5 m/s
MV pk E vel: 47.7 m/s
Reg peak vel: 233 cm/s
TAPSE: 17.3 mm
TDI e' lateral: 7.72
TDI e' medial: 4.35
TR max vel: 233 cm/s
Weight: 1802.48 [oz_av]

## 2016-11-10 LAB — MAGNESIUM: Magnesium: 1.6 mg/dL — ABNORMAL LOW (ref 1.7–2.4)

## 2016-11-10 LAB — CBC
HCT: 29.8 % — ABNORMAL LOW (ref 36.0–46.0)
Hemoglobin: 10.2 g/dL — ABNORMAL LOW (ref 12.0–15.0)
MCH: 31 pg (ref 26.0–34.0)
MCHC: 34.2 g/dL (ref 30.0–36.0)
MCV: 90.6 fL (ref 78.0–100.0)
PLATELETS: 215 10*3/uL (ref 150–400)
RBC: 3.29 MIL/uL — ABNORMAL LOW (ref 3.87–5.11)
RDW: 12.6 % (ref 11.5–15.5)
WBC: 10 10*3/uL (ref 4.0–10.5)

## 2016-11-10 LAB — D-DIMER, QUANTITATIVE: D-Dimer, Quant: 9.63 ug/mL-FEU — ABNORMAL HIGH (ref 0.00–0.50)

## 2016-11-10 LAB — GLUCOSE, CAPILLARY
GLUCOSE-CAPILLARY: 124 mg/dL — AB (ref 65–99)
Glucose-Capillary: 122 mg/dL — ABNORMAL HIGH (ref 65–99)

## 2016-11-10 LAB — BRAIN NATRIURETIC PEPTIDE: B Natriuretic Peptide: 224 pg/mL — ABNORMAL HIGH (ref 0.0–100.0)

## 2016-11-10 LAB — MRSA PCR SCREENING: MRSA BY PCR: NEGATIVE

## 2016-11-10 MED ORDER — CARVEDILOL 3.125 MG PO TABS
3.1250 mg | ORAL_TABLET | Freq: Two times a day (BID) | ORAL | Status: DC
  Administered 2016-11-10 – 2016-11-11 (×3): 3.125 mg via ORAL
  Filled 2016-11-10 (×3): qty 1

## 2016-11-10 MED ORDER — HEPARIN SODIUM (PORCINE) 5000 UNIT/ML IJ SOLN
5000.0000 [IU] | Freq: Three times a day (TID) | INTRAMUSCULAR | Status: DC
  Administered 2016-11-10 – 2016-11-11 (×6): 5000 [IU] via SUBCUTANEOUS
  Filled 2016-11-10 (×6): qty 1

## 2016-11-10 MED ORDER — ALPRAZOLAM 0.5 MG PO TABS
0.5000 mg | ORAL_TABLET | Freq: Two times a day (BID) | ORAL | Status: DC | PRN
  Administered 2016-11-10 – 2016-11-11 (×2): 0.5 mg via ORAL
  Filled 2016-11-10 (×2): qty 1

## 2016-11-10 MED ORDER — POLYETHYLENE GLYCOL 3350 17 G PO PACK: 17.0000 g | PACK | Freq: Every day | ORAL | 0 refills | Status: DC | PRN

## 2016-11-10 MED ORDER — OXYCODONE HCL 5 MG PO TABS
5.0000 mg | ORAL_TABLET | ORAL | Status: DC | PRN
  Administered 2016-11-10 – 2016-11-11 (×4): 5 mg via ORAL
  Filled 2016-11-10 (×4): qty 1

## 2016-11-10 MED ORDER — SODIUM CHLORIDE 0.9% FLUSH
3.0000 mL | Freq: Two times a day (BID) | INTRAVENOUS | Status: DC
  Administered 2016-11-10 – 2016-11-11 (×4): 3 mL via INTRAVENOUS

## 2016-11-10 MED ORDER — TRAMADOL HCL 50 MG PO TABS
50.0000 mg | ORAL_TABLET | Freq: Two times a day (BID) | ORAL | Status: DC
  Administered 2016-11-10 – 2016-11-11 (×4): 50 mg via ORAL
  Filled 2016-11-10 (×4): qty 1

## 2016-11-10 MED ORDER — ESCITALOPRAM OXALATE 20 MG PO TABS
20.0000 mg | ORAL_TABLET | Freq: Two times a day (BID) | ORAL | Status: DC
  Administered 2016-11-10 – 2016-11-11 (×4): 20 mg via ORAL
  Filled 2016-11-10 (×4): qty 1

## 2016-11-10 MED ORDER — SODIUM CHLORIDE 0.9 % IV SOLN
INTRAVENOUS | Status: DC
  Administered 2016-11-10 (×2): via INTRAVENOUS

## 2016-11-10 MED ORDER — MORPHINE SULFATE (PF) 4 MG/ML IV SOLN: 1.0000 mg | INTRAVENOUS | Status: DC | PRN

## 2016-11-10 MED ORDER — PANTOPRAZOLE SODIUM 40 MG PO TBEC
40.0000 mg | DELAYED_RELEASE_TABLET | Freq: Every day | ORAL | Status: DC
  Administered 2016-11-10 – 2016-11-11 (×2): 40 mg via ORAL
  Filled 2016-11-10 (×2): qty 1

## 2016-11-10 MED ORDER — ORAL CARE MOUTH RINSE
15.0000 mL | Freq: Two times a day (BID) | OROMUCOSAL | Status: DC
  Administered 2016-11-10 – 2016-11-11 (×3): 15 mL via OROMUCOSAL

## 2016-11-10 MED ORDER — POTASSIUM CHLORIDE CRYS ER 20 MEQ PO TBCR
20.0000 meq | EXTENDED_RELEASE_TABLET | Freq: Once | ORAL | Status: AC
  Administered 2016-11-10: 20 meq via ORAL
  Filled 2016-11-10: qty 1

## 2016-11-10 MED ORDER — OXYCODONE HCL 5 MG PO TABS: 5.0000 mg | ORAL_TABLET | ORAL | 0 refills | Status: DC | PRN

## 2016-11-10 MED ORDER — BISACODYL 10 MG RE SUPP: 10.0000 mg | Freq: Every day | RECTAL | Status: DC | PRN

## 2016-11-10 MED ORDER — TRAMADOL HCL 50 MG PO TABS: 50.0000 mg | ORAL_TABLET | Freq: Three times a day (TID) | ORAL | 0 refills | Status: DC | PRN

## 2016-11-10 MED ORDER — POLYETHYLENE GLYCOL 3350 17 G PO PACK: 17.0000 g | PACK | Freq: Every day | ORAL | Status: DC | PRN

## 2016-11-10 MED ORDER — ALPRAZOLAM 0.5 MG PO TABS: 0.5000 mg | ORAL_TABLET | Freq: Three times a day (TID) | ORAL | 0 refills | Status: DC | PRN

## 2016-11-10 NOTE — Progress Notes (Signed)
CRITICAL VALUE ALERT  Critical value received:  Troponin 0.03  Date of notification:  11/10/16  Time of notification:  0634  Critical value read back:yes   Nurse who received alert:  Conception Oms  MD notified (1st page):  Donnal Debar   Time of first page:  [phone]  MD notified (2nd page):  Time of second page:  Responding MD:  Donnal Debar  Time MD responded:  [phone]

## 2016-11-10 NOTE — Evaluation (Signed)
Physical Therapy Evaluation Patient Details Name: [name] MRN: [id number] DOB: 04-Jun-1935 Today's Date: 11/10/2016   History of Present Illness  82 yo female  Presented via EMS 11/09/16  with fall , right wrist fracture. H/O  significant of CAD, Takotsubo syndrome, HTN, Anxiety Hyperlipidemia, and IBS, dementia.  Clinical Impression  The patient is lethargic, daughter reports patient has not slept lately since coming to ED. The patient did arouse to drink water while sitting et bed edge. The patient is currently requiring total assistance for mobility. Attempts for transfers to recliner will require extensive assistance. Daughter interested in SNF for patient.    Follow Up Recommendations SNF;Supervision/Assistance - 24 hour    Equipment Recommendations  None recommended by PT    Recommendations for Other Services       Precautions / Restrictions Precautions Precautions: Fall Precaution Comments: OK to bear weight in Platform RW Required Braces or Orthoses: Sling      Mobility  Bed Mobility Overal bed mobility: Needs Assistance Bed Mobility: Supine to Sit;Sit to Supine     Supine to sit: Total assist;HOB elevated Sit to supine: Total assist;+2 for physical assistance;+2 for safety/equipment   General bed mobility comments: bed pad utilized to assist patient to partially sitting up, legs over edge. @ assist to return to supine.  Transfers                 General transfer comment: unable, too lethargic.  Ambulation/Gait                Stairs            Wheelchair Mobility    Modified Rankin (Stroke Patients Only)       Balance Overall balance assessment: History of Falls;Needs assistance Sitting-balance support: Feet unsupported;No upper extremity supported Sitting balance-Leahy Scale: Zero Sitting balance - Comments: patient did not arouse to support self                                     Pertinent Vitals/Pain Pain  Assessment: Faces Faces Pain Scale: Hurts whole lot Pain Descriptors / Indicators: Moaning;Discomfort    Home Living Family/patient expects to be discharged to:: Private residence Living Arrangements: Alone Available Help at Discharge: Family;Available PRN/intermittently Type of Home: House Home Access: Level entry       Home Equipment: Walker - 2 wheels      Prior Function Level of Independence: Needs assistance   Gait / Transfers Assistance Needed: per daughter, she assisted with meals, shopping.           Hand Dominance        Extremity/Trunk Assessment   Upper Extremity Assessment Upper Extremity Assessment: RUE deficits/detail RUE Deficits / Details: in sling         Cervical / Trunk Assessment Cervical / Trunk Assessment: Kyphotic  Communication      Cognition Arousal/Alertness: Lethargic   Overall Cognitive Status: Difficult to assess                                 General Comments: patient is  barely arousing to stimulation.      General Comments      Exercises     Assessment/Plan    PT Assessment Patient needs continued PT services  PT Problem List Decreased mobility;Decreased balance;Decreased activity tolerance;Decreased range of motion;Decreased strength;Decreased cognition;Decreased safety  awareness;Decreased knowledge of use of DME;Decreased knowledge of precautions;Pain       PT Treatment Interventions Functional mobility training;Therapeutic activities;Therapeutic exercise;Patient/family education    PT Goals (Current goals can be found in the Care Plan section)  Acute Rehab PT Goals Patient Stated Goal: per daughter, to go to SNF. PT Goal Formulation: With family Time For Goal Achievement: 11/24/16 Potential to Achieve Goals: Good    Frequency Min 3X/week   Barriers to discharge Decreased caregiver support      Co-evaluation               End of Session   Activity Tolerance: Patient limited by  lethargy;Patient limited by pain Patient left: in bed;with call bell/phone within reach;with bed alarm set;with family/visitor present Nurse Communication: Mobility status PT Visit Diagnosis: History of falling (Z91.81);Difficulty in walking, not elsewhere classified (R26.2)    Time: 7014-1030 PT Time Calculation (min) (ACUTE ONLY): 30 min   Charges:   PT Evaluation $PT Eval Low Complexity: 1 Procedure PT Treatments $Therapeutic Activity: 8-22 mins   PT G Codes:   PT G-Codes **NOT FOR INPATIENT CLASS** Functional Assessment Tool Used: AM-PAC 6 Clicks Basic Mobility;Clinical judgement Functional Limitation: Mobility: Walking and moving around Mobility: Walking and Moving Around Current Status (D3143): 100 percent impaired, limited or restricted Mobility: Walking and Moving Around Goal Status (O8875): At least 20 percent but less than 40 percent impaired, limited or restricted    [facility] PT [phone]   Claretha Cooper 11/10/2016, 1:37 PM

## 2016-11-10 NOTE — Discharge Summary (Signed)
Discharge Summary  [name] FMB:[id number] DOB: 08/01/1934  PCP: Woody Seller, MD  Admit date: 11/09/2016 Anticipated Discharge date: 11/11/2016  Time spent:  40 minutes  Recommendations for Outpatient Follow-up:  1. New medication: OxyIR 5 mg every 4 hours when necessary for pain 2. Patient being discharged to skilled nursing facility 3. She will follow-up with Dr. Milly Jakob, orthopedics,  later this week  Discharge Diagnoses:  Active Hospital Problems   Diagnosis Date Noted  . Leucocytosis 11/10/2016  . Traumatic closed nondisp torus fracture of distal radial metaphysis, right, sequela 11/10/2016  . Chronic diastolic heart failure ([place]) 11/10/2016  . Hypoxia 11/09/2016  . Dementia 11/09/2016  . Debility 11/09/2016  . Essential hypertension 11/21/2014  . CAD (coronary artery disease) 11/21/2014  . Fall   . Syncope 11/20/2014  . Hypokalemia 06/14/2013  . GERD 05/25/2009    Resolved Hospital Problems   Diagnosis Date Noted Date Resolved  No resolved problems to display.    Discharge Condition: Improved, being discharged home  Diet recommendation: Heart healthy   Vitals:   11/10/16 1024 11/10/16 1259  BP: (!) 119/59 (!) 118/54  Pulse: 74 70  Resp: 20 20  Temp: 98.8 F (37.1 C) 98.3 F (36.8 C)    History of present illness:  82 year old female with past medical history of anxiety, hypertension and CAD who has been slowly going downhill over the past 6 months. Patient lost her husband of many years 6 months ago. Since that time, according to daughter, she has been living alone, but her daughter has to check on her more often.  The patient gets more more forgetful, falls every now and then and has very poor by mouth intake.  Patient was brought in on evening of 4/20 after her daughter found her on the ground after a fall which the patient did not recall. She son have a distal radius fracture and underwent splinting by orthopedics. She initially was going to  be sent home but then she was found to be hypoxic although there was no evidence of pneumonia or volume overload to account for this. D-dimer elevated, but CT scan of chest negative for PE. Patient was brought in overnight for observation.  Hospital Course:  Active Problems:   GERD   Hypokalemia: From dehydration. Improved with hydration   Syncope: Frequent falls.   Essential hypertension: Blood pressure stable   CAD (coronary artery disease)   Fall   Hypoxia: Unclear etiology. Patient does take Xanax and according to daughter frequently. Also may be secondary to diastolic heart failure   Dementia mild without behavioral disturbance: Steadily declining problem. Had extensive conversation with patient's daughter. Changing her to DO NOT RESUSCITATE   Debility   Leucocytosis   Traumatic closed nondisp torus fracture of distal radial metaphysis, right, sequela: Splinted. Seen by PT recommending skilled nursing. Hoping to find bed-to replace.   Chronic diastolic heart failure [facility]): Echocardiogram done notes chronic diastolic heart failure. Currently looks euvolemic. Checking BNP.   Procedures:  Splinting of right wrist   Consultations:  Shanon Brow Thompson-orthopedics   Discharge Exam: BP (!) 118/54 (BP Location: Left Leg)   Pulse 70   Temp 98.3 F (36.8 C) (Oral)   Resp 20   Wt 51.1 kg (112 lb 10.5 oz)   SpO2 97%   BMI 19.34 kg/m   General: Somnolent  Cardiovascular: Regular rate and rhythm, T0-V7, 2/6 systolic ejection murmur  Respiratory: Clear to auscultation bilaterally   Discharge Instructions You were cared for by a hospitalist  during your hospital stay. If you have any questions about your discharge medications or the care you received while you were in the hospital after you are discharged, you can call the unit and asked to speak with the hospitalist on call if the hospitalist that took care of you is not available. Once you are discharged, your primary care physician will  handle any further medical issues. Please note that NO REFILLS for any discharge medications will be authorized once you are discharged, as it is imperative that you return to your primary care physician (or establish a relationship with a primary care physician if you do not have one) for your aftercare needs so that they can reassess your need for medications and monitor your lab values.  Discharge Instructions    DME Bedside commode    Complete by:  As directed    Patient needs a bedside commode to treat with the following condition:  Fall   Face-to-face encounter (required for Medicare/Medicaid patients)    Complete by:  As directed    I CAMPOS,KEVIN M certify that this patient is under my care and that I, or a nurse practitioner or physician's assistant working with me, had a face-to-face encounter that meets the physician face-to-face encounter requirements with this patient on 11/09/2016. The encounter with the patient was in whole, or in part for the following medical condition(s) which is the primary reason for home health care (List medical condition): falls and right wrist fracture   The encounter with the patient was in whole, or in part, for the following medical condition, which is the primary reason for home health care:  falls, recent right wrist fracture   I certify that, based on my findings, the following services are medically necessary home health services:  Nursing   Reason for Medically Necessary Home Health Services:  Skilled Nursing- Teaching of Disease Process/Symptom Management   My clinical findings support the need for the above services:  Unsafe ambulation due to balance issues   Further, I certify that my clinical findings support that this patient is homebound due to:  Unsafe ambulation due to balance issues   Home Health    Complete by:  As directed    To provide the following care/treatments:   PT OT [place] work       Allergies as of  11/10/2016      Reactions   Adhesive [tape] Other (See Comments)   Sensitive to adhesives, must be removed with adhesive remover due to thin skin.   Sulfamethoxazole-trimethoprim Hives      Medication List    TAKE these medications   ALPRAZolam 0.5 MG tablet Commonly known as:  XANAX Take 1 tablet (0.5 mg total) by mouth 3 (three) times daily as needed for anxiety. What changed:  See the new instructions.   aspirin EC 81 MG tablet Take 81 mg by mouth every 6 (six) hours as needed for moderate pain.   CALCIUM + D PO Take 1 tablet by mouth daily.   carvedilol 3.125 MG tablet Commonly known as:  COREG TAKE 1 TABLET BY MOUTH TWICE A DAY WITH A MEAL   CRANBERRY PO Take 2 capsules by mouth daily.   diphenoxylate-atropine 2.5-0.025 MG tablet Commonly known as:  LOMOTIL Take 1 tablet by mouth 2 (two) times daily.   escitalopram 10 MG tablet Commonly known as:  LEXAPRO Take 20 mg by mouth 2 (two) times daily.   HYDROcodone-acetaminophen 5-325 MG tablet Commonly known  as:  NORCO Take 1 tablet by mouth every 6 (six) hours as needed for moderate pain. What changed:  how much to take  when to take this   nitroGLYCERIN 0.4 MG SL tablet Commonly known as:  NITROSTAT Place 1 tablet (0.4 mg total) under the tongue every 5 (five) minutes as needed. For chest pain.   omeprazole 20 MG capsule Commonly known as:  PRILOSEC Take 20 mg by mouth daily.   oxyCODONE 5 MG immediate release tablet Commonly known as:  Oxy IR/ROXICODONE Take 1 tablet (5 mg total) by mouth every 4 (four) hours as needed for moderate pain.   polyethylene glycol packet Commonly known as:  MIRALAX / GLYCOLAX Take 17 g by mouth daily as needed for mild constipation.   PROBIOTIC DAILY PO Take 1 tablet by mouth daily.   STOOL SOFTENER PO Take 1 tablet by mouth daily as needed (constipation).   traMADol 50 MG tablet Commonly known as:  ULTRAM Take 1 tablet (50 mg total) by mouth 3 (three) times daily as  needed. What changed:  when to take this  reasons to take this   trolamine salicylate 10 % cream Commonly known as:  ASPERCREME Apply 1 application topically as needed (for back pain).   vitamin B-12 1000 MCG tablet Commonly known as:  CYANOCOBALAMIN Take 1,000 mcg by mouth daily.            Durable Medical Equipment        Start     Ordered   11/09/16 0000  DME Bedside commode    Question:  Patient needs a bedside commode to treat with the following condition  Answer:  Fall   11/09/16 1631     Allergies  Allergen Reactions  . Adhesive [Tape] Other (See Comments)    Sensitive to adhesives, must be removed with adhesive remover due to thin skin.  Octaviano Glow Hives   Follow-up Information    THOMPSON, DAVID A., MD Follow up.   Specialty:  Orthopedic Surgery Why:  office will call you on Monday to make the next appointment  Contact information: [place] [address] [phone]        Woody Seller, MD.   Specialty:  Family Medicine Why:  Please call your doctor for follow up in 2-5 days Contact information: [address] [place] [address] [phone]        [place] DEPT.   Specialty:  Emergency Medicine Why:  Return to ER for any new or worsening symptoms Contact information: [place] [id number] [place] [phone]           The results of significant diagnostics from this hospitalization (including imaging, microbiology, ancillary and laboratory) are listed below for reference.    Significant Diagnostic Studies: Dg Ribs Unilateral W/chest Right  Result Date: 11/09/2016 CLINICAL DATA:  Fall at home this morning. Right-sided rib and chest pain. Initial encounter. EXAM: RIGHT RIBS AND CHEST - 3+ VIEW COMPARISON:  Chest radiograph on 11/21/2014 FINDINGS: No fracture or other bone lesions are seen involving the ribs. There  is no evidence of pneumothorax or pleural effusion. Both lungs are clear. Heart size is normal. Stable ectasia of thoracic aorta. Bilateral breast implants noted, as well as lumbar spine fusion hardware and previous upper lumbar vertebroplasty. IMPRESSION: No acute findings. Electronically Signed   By: Earle Gell M.D.   On: 11/09/2016 10:55   Dg Wrist Complete Right  Result Date: 11/09/2016  CLINICAL DATA:  Closed fracture distal radius postreduction EXAM: RIGHT WRIST - COMPLETE 3+ VIEW COMPARISON:  11/09/2016 FINDINGS: Three views of the right wrist submitted. Limited study by casting material artifact. Persistent comminuted fracture in distal right radius with dorsal angulation. No significant change in alignment from prior exam. IMPRESSION: Persistent comminuted fracture in distal right radius with dorsal angulation. No significant change in alignment from prior exam. Electronically Signed   By: Lahoma Crocker M.D.   On: 11/09/2016 17:01   Dg Wrist Complete Right  Result Date: 11/09/2016 CLINICAL DATA:  Postreduction films. EXAM: RIGHT WRIST - COMPLETE 3+ VIEW COMPARISON:  Earlier the same day FINDINGS: Fine bony detail obscured by the overlying plaster. Fracture the distal radial metaphysis again noted. Bony alignment similar to the pre reduction films. Bones appear demineralized. IMPRESSION: No substantial interval change status post closed reduction. Electronically Signed   By: Misty Stanley M.D.   On: 11/09/2016 14:35   Dg Wrist Complete Right  Result Date: 11/09/2016 CLINICAL DATA:  Fall at home this a.m landing onto rt side complains of rt sided chest pain, rt wrist pain with deformity EXAM: RIGHT WRIST - COMPLETE 3+ VIEW COMPARISON:  None. FINDINGS: Fracture of the distal radial metaphysis with dorsal angulation. Fracture does not appear to enter the articular surface. Radiocarpal joint is intact. Mild comminution of the fracture. Fracture of the ulnar styloid age-indeterminate. IMPRESSION: 1.  Comminuted fracture of the distal radius with dorsal angulation. 2. Radiocarpal joint intact. 3. Age-indeterminate ulnar styloid fracture. Electronically Signed   By: Suzy Bouchard M.D.   On: 11/09/2016 10:55   Ct Head Wo Contrast  Result Date: 11/09/2016 CLINICAL DATA:  Patient presents to the emergency department after tripping over her cat food bowl this morning and landing on her right wrist and right lateral chest. She presents with obvious deformity of her right wrist. She denies head injury or headache at this time. She denies neck pain. She denies recent illness. She is alert and oriented 4. She presents via EMS. She denies hip pain. Her only complaint is focused right wrist pain. She denies neck pain EXAM: CT HEAD WITHOUT CONTRAST CT CERVICAL SPINE WITHOUT CONTRAST TECHNIQUE: Multidetector CT imaging of the head and cervical spine was performed following the standard protocol without intravenous contrast. Multiplanar CT image reconstructions of the cervical spine were also generated. COMPARISON:  11/20/2014 FINDINGS: CT HEAD FINDINGS Brain: No evidence of acute infarction, hemorrhage, hydrocephalus, extra-axial collection or mass lesion/mass effect. Vascular: No hyperdense vessel or unexpected calcification. Skull: Normal. Negative for fracture or focal lesion. Sinuses/Orbits: No acute finding. Other: None. CT CERVICAL SPINE FINDINGS Alignment: Hyperextended but otherwise unremarkable with no spondylolisthesis. Skull base and vertebrae: No acute fracture. No primary bone lesion or focal pathologic process. Soft tissues and spinal canal: No prevertebral fluid or swelling. No visible canal hematoma. Disc levels: Mild to moderate loss disc height at C3-C4. Moderate loss disc height at C5-C6 and C6-C7. Endplate spurring at these levels. Bilateral facet degenerative change. Bones are demineralized. There are mild areas of disc bulging, but no convincing disc herniation. Upper chest: Apical  pleuroparenchymal scarring.  No acute findings. Other: None. IMPRESSION: HEAD CT:  No acute intracranial abnormalities.  No skull fracture. CERVICAL CT:  No fracture or acute finding. Electronically Signed   By: Lajean Manes M.D.   On: 11/09/2016 17:54   Ct Cervical Spine Wo Contrast  Result Date: 11/09/2016 CLINICAL DATA:  Patient presents to the emergency department after tripping over her cat food  bowl this morning and landing on her right wrist and right lateral chest. She presents with obvious deformity of her right wrist. She denies head injury or headache at this time. She denies neck pain. She denies recent illness. She is alert and oriented 4. She presents via EMS. She denies hip pain. Her only complaint is focused right wrist pain. She denies neck pain EXAM: CT HEAD WITHOUT CONTRAST CT CERVICAL SPINE WITHOUT CONTRAST TECHNIQUE: Multidetector CT imaging of the head and cervical spine was performed following the standard protocol without intravenous contrast. Multiplanar CT image reconstructions of the cervical spine were also generated. COMPARISON:  11/20/2014 FINDINGS: CT HEAD FINDINGS Brain: No evidence of acute infarction, hemorrhage, hydrocephalus, extra-axial collection or mass lesion/mass effect. Vascular: No hyperdense vessel or unexpected calcification. Skull: Normal. Negative for fracture or focal lesion. Sinuses/Orbits: No acute finding. Other: None. CT CERVICAL SPINE FINDINGS Alignment: Hyperextended but otherwise unremarkable with no spondylolisthesis. Skull base and vertebrae: No acute fracture. No primary bone lesion or focal pathologic process. Soft tissues and spinal canal: No prevertebral fluid or swelling. No visible canal hematoma. Disc levels: Mild to moderate loss disc height at C3-C4. Moderate loss disc height at C5-C6 and C6-C7. Endplate spurring at these levels. Bilateral facet degenerative change. Bones are demineralized. There are mild areas of disc bulging, but no convincing  disc herniation. Upper chest: Apical pleuroparenchymal scarring.  No acute findings. Other: None. IMPRESSION: HEAD CT:  No acute intracranial abnormalities.  No skull fracture. CERVICAL CT:  No fracture or acute finding. Electronically Signed   By: Lajean Manes M.D.   On: 11/09/2016 17:54   Ct Angio Chest/abd/pel For Dissection W And/or Wo Contrast  Result Date: 11/09/2016 CLINICAL DATA:  Syncope with hypertension and hypoxia. Evaluate for aortic dissection. EXAM: CT ANGIOGRAPHY CHEST, ABDOMEN AND PELVIS TECHNIQUE: Multidetector CT imaging through the chest, abdomen and pelvis was performed using the standard protocol during bolus administration of intravenous contrast. Multiplanar reconstructed images and MIPs were obtained and reviewed to evaluate the vascular anatomy. CONTRAST:  100 CC Isovue 370 COMPARISON:  None. FINDINGS: CTA CHEST FINDINGS Study is markedly motion degraded. Cardiovascular: Precontrast imaging shows no hyperdense crescent in the wall of the thoracic aorta to suggest acute intramural hematoma. Ascending thoracic aorta measures approximately 3.3 cm diameter. Within the limitation of 9 gated technique and substantial patient motion, no definite dissection flap is identified in the thoracic aorta. Heart size is normal. No pericardial effusion. No large central pulmonary embolus in the main pulmonary outflow tract or either main pulmonary artery. Mediastinum/Nodes: No mediastinal lymphadenopathy. There is no hilar lymphadenopathy. There is no axillary lymphadenopathy. Lungs/Pleura: No focal airspace consolidation. No overt pulmonary edema. No substantial pleural effusion. Given the extreme motion artifact, small pulmonary nodules could be obscured. Musculoskeletal: No gross lytic or sclerotic osseous abnormality is identified. Assessment of bony anatomy for fracture is markedly limited by the motion artifact. Review of the MIP images confirms the above findings. CTA ABDOMEN AND PELVIS FINDINGS  VASCULAR Aorta: No thoracic aortic aneurysm. Celiac: Patent. Assessment for stenosis not possible due to motion artifact. SMA: Patent. Assessment for proximal stenosis not possible due to motion artifact. Renals: Patent bilaterally. Accessory left renal artery noted. Fine detail obscured by motion artifact. IMA: Not visualized but there is substantial motion artifact in this region. Veins: Portal vein and superior mesenteric vein are patent. IVC appears patent although assessment is degraded by streak artifact from spinal fixation hardware and motion artifact. Review of the MIP images confirms the above  findings. NON-VASCULAR Hepatobiliary: No gross abnormality in the liver parenchyma. There is no evidence for gallstones, gallbladder wall thickening, or pericholecystic fluid. No intrahepatic or extrahepatic biliary dilation. Pancreas: Limited assessment due to motion without focal abnormality identified. No dilatation of the main pancreatic duct. Spleen: Unremarkable. Adrenals/Urinary Tract: No adrenal nodule or mass. No hydronephrosis or gross renal mass evident. No evidence for hydroureter. Bladder is obscured by streak artifact from right hip replacement. Stomach/Bowel: Stomach is nondistended. Duodenum not well evaluated. No evidence for small bowel obstruction. No evidence for colonic obstruction. Bowel extends into a right groin hernia but cannot be clearly discerned given streak artifact from the hip replacement and patient motion. Lymphatic: No gross lymphadenopathy can be identified in the abdomen or pelvis although right pelvic sidewall not well evaluated. Reproductive: Obscured. Other: No substantial intraperitoneal free fluid. Musculoskeletal: Diffuse demineralization. Right hip replacement. Old inferior right pubic ramus fracture. Status post lower lumbar fusion. Multilevel thoracolumbar compression fractures. Review of the MIP images confirms the above findings. IMPRESSION: 1. Study is markedly  degraded by patient motion throughout image acquisition imaging through the pelvis further degraded by lumbar fusion hardware and right hip replacement. 2. Within the above-stated limitation, no definite dissection is identified in the thoraco abdominal aorta. There is no thoracoabdominal aortic aneurysm. 3. Right groin hernia contains bowel but is largely obscured by streak artifact and motion artifact. No findings to suggest bowel obstruction at this time. 4. Diffuse bony demineralization with multiple thoracolumbar compression fractures. These are age-indeterminate and cannot be further evaluated given the marked motion degradation. Electronically Signed   By: Misty Stanley M.D.   On: 11/09/2016 21:25    Microbiology: Recent Results (from the past 240 hour(s))  MRSA PCR Screening     Status: None   Collection Time: 11/10/16  6:03 AM  Result Value Ref Range Status   MRSA by PCR NEGATIVE NEGATIVE Final    Comment:        The GeneXpert MRSA Assay (FDA approved for NASAL specimens only), is one component of a comprehensive MRSA colonization surveillance program. It is not intended to diagnose MRSA infection nor to guide or monitor treatment for MRSA infections.      Labs: Basic Metabolic Panel:  Recent Labs Lab 11/09/16 1018 11/09/16 2218 11/10/16 0524  NA 136  --  135  K 3.3*  --  3.6  CL 101  --  103  CO2 28  --  23  GLUCOSE 143*  --  139*  BUN 13  --  11  CREATININE 0.85  --  0.66  CALCIUM 8.7*  --  8.5*  MG  --  1.6*  --    Liver Function Tests: No results for input(s): AST, ALT, ALKPHOS, BILITOT, PROT, ALBUMIN in the last 168 hours. No results for input(s): LIPASE, AMYLASE in the last 168 hours. No results for input(s): AMMONIA in the last 168 hours. CBC:  Recent Labs Lab 11/09/16 1018 11/10/16 0524  WBC 12.2* 10.0  HGB 11.3* 10.2*  HCT 33.7* 29.8*  MCV 91.3 90.6  PLT 245 215   Cardiac Enzymes:  Recent Labs Lab 11/09/16 2218 11/10/16 0524  11/10/16 1044  TROPONINI <0.03 0.03* 0.03*   BNP: BNP (last 3 results) No results for input(s): BNP in the last 8760 hours.  ProBNP (last 3 results) No results for input(s): PROBNP in the last 8760 hours.  CBG:  Recent Labs Lab 11/10/16 0616 11/10/16 0737  GLUCAP 124* 122*       Signed:  Annita Brod, MD [facility] 11/10/2016, 5:22 PM

## 2016-11-10 NOTE — Progress Notes (Signed)
Occupational Therapy Evaluation Patient Details Name: [name] MRN: [id number] DOB: 05-30-1935 Today's Date: 11/10/2016    History of Present Illness 82 yo female  Presented via EMS 11/09/16  with fall , right wrist fracture. H/O  significant of CAD, Takotsubo syndrome, HTN, Anxiety Hyperlipidemia, and IBS, dementia.   Clinical Impression   PTA, pt living alone at home with daughter assisting with ADL and IADL tasks. Daughter states her mother was having increased difficulty with taking care of herself. Evaluation limited by lethargy. Pt currently requuires total A for mobility and Max to total A for ADL. Pt will benefit from rehab at SNF to maximize functional level of independence and assist with safe plan of care after DC from SNF. Pt with wet drainage on splint/dressing of RUE - nsg notified. Sling removed and RUE elevated on pillow to reduce dependent edema. Will follow acutely to address established goals and facilitate safe DC to SNF.Marland Kitchen     Follow Up Recommendations  SNF;Supervision/Assistance - 24 hour    Equipment Recommendations  Other (comment) (TBA at next venue)    Recommendations for Other Services       Precautions / Restrictions Precautions Precautions: Fall Precaution Comments:  (no WBS in chart) Required Braces or Orthoses: Sling (no orders for slling) Restrictions Weight Bearing Restrictions:  (no WBS in chart)      Mobility Bed Mobility Overal bed mobility: Needs Assistance Bed Mobility: Supine to Sit;Sit to Supine     Supine to sit: Total assist;HOB elevated Sit to supine: Total assist;+2 for physical assistance;+2 for safety/equipment   General bed mobility comments: bed pad utilized to assist patient to partially sitting up, legs over edge. @ assist to return to supine.  Transfers                 General transfer comment: unable, too lethargic.    Balance Overall balance assessment: History of Falls;Needs assistance Sitting-balance support:  Feet unsupported;No upper extremity supported Sitting balance-Leahy Scale: Zero Sitting balance - Comments: patient did not arouse to support self                                   ADL either performed or assessed with clinical judgement   ADL Overall ADL's : Needs assistance/impaired                                       General ADL Comments: At this time, pt max A for ADL due to lethargy. Daughter states that her mother needed increased assistance for ADL, especially since 2023/07/18 - when her husb and died. Daughter states pt could not do her own medication management adn that her medicine was "messed up and maybe that's why she fell". Pt unable to "figure out how to make a pot of coffee", etc.     Vision         Perception     Praxis      Pertinent Vitals/Pain Pain Assessment: Faces Faces Pain Scale: Hurts even more Pain Location: RUE Pain Descriptors / Indicators: Moaning;Discomfort Pain Intervention(s): Limited activity within patient's tolerance     Hand Dominance Right   Extremity/Trunk Assessment Upper Extremity Assessment Upper Extremity Assessment: RUE deficits/detail RUE Deficits / Details: in sling but no orders for sling. Sling removed. RUE elevated to reduce dependent edema. Drainage on splint/ace wrap/  nsg notified RUE Coordination: decreased fine motor;decreased gross motor   Lower Extremity Assessment Lower Extremity Assessment: Defer to PT evaluation   Cervical / Trunk Assessment Cervical / Trunk Assessment: Kyphotic   Communication Communication Communication:  (slurred speech/lethargic)   Cognition Arousal/Alertness: Lethargic Behavior During Therapy: Flat affect Overall Cognitive Status: Difficult to assess                                 General Comments: patient is  barely arousing to stimulation.   General Comments       Exercises Exercises: Other exercises Other Exercises Other Exercises:  RUE PROM within tolerance Other Exercises: R digit PROM/ elevation for edema control   Shoulder Instructions      Home Living Family/patient expects to be discharged to:: Skilled nursing facility Living Arrangements: Alone Available Help at Discharge: Family;Available PRN/intermittently Type of Home: House Home Access: Level entry                     Home Equipment: Walker - 2 wheels          Prior Functioning/Environment Level of Independence: Needs assistance  Gait / Transfers Assistance Needed: per daughter, she assisted with meals, shopping. ADL's / Homemaking Assistance Needed: daughter assisted with ADL as needed. Did her mother's medication management and IADL tasks.            OT Problem List: Decreased strength;Decreased range of motion;Decreased activity tolerance;Impaired balance (sitting and/or standing);Decreased coordination;Decreased cognition;Decreased safety awareness;Decreased knowledge of use of DME or AE;Impaired UE functional use;Pain;Increased edema      OT Treatment/Interventions: Self-care/ADL training;Therapeutic exercise;DME and/or AE instruction;Therapeutic activities;Cognitive remediation/compensation;Patient/family education;Balance training    OT Goals(Current goals can be found in the care plan section) Acute Rehab OT Goals Patient Stated Goal: per daughter, to go to SNF. OT Goal Formulation: With family Time For Goal Achievement: 11/24/16 Potential to Achieve Goals: Fair ADL Goals Pt Will Perform Eating: with set-up;sitting Pt Will Perform Grooming: sitting;with min assist Pt Will Perform Upper Body Bathing: with min assist;sitting Pt Will Transfer to Toilet: with min assist;with +2 assist;bedside commode;stand pivot transfer Additional ADL Goal #1: Pt/family will iindependently verbalize understanding of positioning/ROM of digits of RUE for edema control  OT Frequency: Min 2X/week   Barriers to D/C: Decreased caregiver support           Co-evaluation              End of Session Nurse Communication: Other (comment) (drainage on splint/dressings)  Activity Tolerance: Patient limited by lethargy Patient left: in bed;with call bell/phone within reach;with bed alarm set;with family/visitor present;with restraints reapplied  OT Visit Diagnosis: Unsteadiness on feet (R26.81);Repeated falls (R29.6);Muscle weakness (generalized) (M62.81);Pain;Cognitive communication deficit (R41.841) Pain - Right/Left: Right Pain - part of body: Arm                Time: 2841-3244 OT Time Calculation (min): 14 min Charges:  OT General Charges $OT Visit: 1 Procedure OT Evaluation $OT Eval Moderate Complexity: 1 Procedure G-Codes: OT G-codes **NOT FOR INPATIENT CLASS** Functional Assessment Tool Used: Clinical judgement Functional Limitation: Self care Self Care Current Status (W1027): At least 80 percent but less than 100 percent impaired, limited or restricted Self Care Goal Status (O5366): At least 20 percent but less than 40 percent impaired, limited or restricted   [facility], OT/L  [phone] 11/10/2016  Yeshua Stryker,HILLARY 11/10/2016, 2:24 PM

## 2016-11-10 NOTE — Progress Notes (Signed)
SLP Cancellation Note  Patient Details Name: [name] MRN: [id number] DOB: 1935-02-01   Cancelled treatment:        Daughter reported pt has has very little sleep and requested SLP return. This therapist was unable to return today. Will continue efforts.    Houston Siren 11/10/2016, 4:25 PM   Orbie Pyo Colvin Caroli.Ed [employer] [phone]

## 2016-11-10 NOTE — Progress Notes (Signed)
Pt. right wrist/forearm with small amount of bleeding noted throughout the day. Right hand remains bruised with edema. Right extremity elevated. MD notified, to continue to monitor and contact Ortho if bleeding increases.

## 2016-11-11 DIAGNOSIS — F0390 Unspecified dementia without behavioral disturbance: Secondary | ICD-10-CM | POA: Diagnosis not present

## 2016-11-11 DIAGNOSIS — D62 Acute posthemorrhagic anemia: Secondary | ICD-10-CM | POA: Diagnosis not present

## 2016-11-11 DIAGNOSIS — I5032 Chronic diastolic (congestive) heart failure: Secondary | ICD-10-CM | POA: Diagnosis not present

## 2016-11-11 DIAGNOSIS — S52521S Torus fracture of lower end of right radius, sequela: Secondary | ICD-10-CM | POA: Diagnosis not present

## 2016-11-11 LAB — CBC
HEMATOCRIT: 25.3 % — AB (ref 36.0–46.0)
HEMOGLOBIN: 8.6 g/dL — AB (ref 12.0–15.0)
MCH: 31.2 pg (ref 26.0–34.0)
MCHC: 34 g/dL (ref 30.0–36.0)
MCV: 91.7 fL (ref 78.0–100.0)
Platelets: 183 10*3/uL (ref 150–400)
RBC: 2.76 MIL/uL — ABNORMAL LOW (ref 3.87–5.11)
RDW: 12.7 % (ref 11.5–15.5)
WBC: 7.8 10*3/uL (ref 4.0–10.5)

## 2016-11-11 LAB — HEMOGLOBIN A1C
HEMOGLOBIN A1C: 5.2 % (ref 4.8–5.6)
MEAN PLASMA GLUCOSE: 103 mg/dL

## 2016-11-11 LAB — GLUCOSE, CAPILLARY: GLUCOSE-CAPILLARY: 100 mg/dL — AB (ref 65–99)

## 2016-11-11 MED ORDER — FUROSEMIDE 40 MG PO TABS
40.0000 mg | ORAL_TABLET | Freq: Once | ORAL | Status: AC
  Administered 2016-11-11: 40 mg via ORAL
  Filled 2016-11-11: qty 1

## 2016-11-11 NOTE — Care Management Note (Signed)
Case Management Note  Patient Details  Name: [name] MRN: [id number] Date of Birth: 05-08-1935  Subjective/Objective:     Traumatic closed nondisplaced fx, Leucocytosis               Action/Plan: Discharge Planning: Chart reviewed. Scheduled dc to SNF rehab. CSW following for placement.   PCP Kathryne Eriksson H  Expected Discharge Date:  11/11/2016              Expected Discharge Plan:  Skilled Nursing Facility  In-House Referral:  Clinical Social Work  Discharge planning Services  CM Consult  Post Acute Care Choice:  NA Choice offered to:  NA  DME Arranged:  N/A DME Agency:  NA  HH Arranged:  NA HH Agency:  NA  Status of Service:  Completed, signed off  If discussed at [employer] of Stay Meetings, dates discussed:    Additional Comments:  Erenest Rasher, RN 11/11/2016, 2:30 PM

## 2016-11-11 NOTE — Progress Notes (Signed)
Pt has bed at [facility] today- dtr is agreeable to this plan  [name] is getting room ready request transport around 3pm  CSW will continue to follow  Jorge Ny MSW, LCSW [facility] #: [phone]

## 2016-11-11 NOTE — Progress Notes (Signed)
SLP Cancellation Note  Patient Details Name: Breshae Belcher MRN: [id number] DOB: 1934-10-10   Cancelled treatment:       Reason Eval/Treat Not Completed:  (Pt with active  DC orders to SNF. Will defer cognitive/linguistic evaluation to facility SLP)   Shelly Flatten, Anita, [place] [facility] SLP [phone] Lamar Sprinkles 11/11/2016, 2:48 PM

## 2016-11-11 NOTE — Discharge Summary (Addendum)
Discharge Summary  [name] UVO:[id number] DOB: Jul 16, 1935  PCP: Woody Seller, MD  Admit date: 11/09/2016 Anticipated Discharge date: 11/11/2016  Time spent:  40 minutes  Recommendations for Outpatient Follow-up:  1. New medication: OxyIR 5 mg every 4 hours when necessary for pain 2. Patient being discharged to skilled nursing facility 3. She will follow-up with Dr. Milly Jakob, orthopedics,  later this week 4. Speech therapy at skilled nursing to check swallow evaluation. 5. Patient needs repeat CBC on Monday for/23. See below.  Discharge Diagnoses:  Active Hospital Problems   Diagnosis Date Noted  . Leucocytosis 11/10/2016  . Traumatic closed nondisp torus fracture of distal radial metaphysis, right, sequela 11/10/2016  . Chronic diastolic heart failure ([place]) 11/10/2016  . Hypoxia 11/09/2016  . Dementia 11/09/2016  . Debility 11/09/2016  . Essential hypertension 11/21/2014  . CAD (coronary artery disease) 11/21/2014  . Fall   . Syncope 11/20/2014  . Hypokalemia 06/14/2013  . GERD 05/25/2009    Resolved Hospital Problems   Diagnosis Date Noted Date Resolved  No resolved problems to display.    Discharge Condition: Improved, being discharged home  Diet recommendation: Heart healthy   Vitals:   11/11/16 0415 11/11/16 1000  BP: 125/73 107/63  Pulse: 84 71  Resp: 20 14  Temp: 99.2 F (37.3 C) 98.7 F (37.1 C)    History of present illness:  82 year old female with past medical history of anxiety, hypertension and CAD who has been slowly going downhill over the past 6 months. Patient lost her husband of many years 6 months ago. Since that time, according to daughter, she has been living alone, but her daughter has to check on her more often.  The patient gets more more forgetful, falls every now and then and has very poor by mouth intake.  Patient was brought in on evening of 4/20 after her daughter found her on the ground after a fall which the patient did  not recall. She son have a distal radius fracture and underwent splinting by orthopedics. She initially was going to be sent home but then she was found to be hypoxic although there was no evidence of pneumonia or volume overload to account for this. D-dimer elevated, but CT scan of chest negative for PE. Patient was brought in overnight for observation.  Hospital Course:  Active Problems:   GERD   Hypokalemia: From dehydration. Improved with hydration   Syncope: Frequent falls.   Essential hypertension: Blood pressure stable   CAD (coronary artery disease)   Fall   Hypoxia: Unclear etiology. Patient does take Xanax and according to daughter frequently. Also may be secondary to diastolic heart failure   Dementia mild without behavioral disturbance: Steadily declining problem. Had extensive conversation with patient's daughter. Changing her to DO NOT RESUSCITATE   Debility Anemia: Patient's hemoglobin on admission of 11. However likely this is heme concentration. She was gently hydrated and by day of discharge, hemoglobin was 8.3. However, patient had no signs of sudden onset blood loss and this looked to be more secondary to her trauma plus chronic anemia and also elevated on admission due to heme concentration. Recommending follow-up CBC to be drawn on 4/23.   Traumatic closed nondisp torus fracture of distal radial metaphysis, right, sequela: Splinted. Seen by PT recommending skilled nursing. Patient accepted to [facility] and will go on 4/22. Nursing noted right hand edematous. Rapp undone. Orthopedic tech plans to redo splint prior to discharge.   Chronic diastolic heart failure [facility]): Echocardiogram done  notes chronic diastolic heart failure. Currently looks euvolemic. BNP within normal limits.  Senile dementia without behavioral disturbance: According to the patient's daughter, she has had some down word trend ever since she lost her husband 6 months ago. She has been more forgetful to the  point where times she cannot remember which she has been doing and cannot remember when she's had a fall. I suspect she may have some mild underlying dementia and has reached a point where she cannot take care of herself. Her daughter is medical power of attorney after we had an extensive discussion, she indicated that the patient should be DO NOT RESUSCITATE. CODE STATUS changed.   Procedures:  Splinting of right wrist   Consultations:  Shanon Brow Thompson-orthopedics   Discharge Exam: BP 107/63 (BP Location: Left Arm)   Pulse 71   Temp 98.7 F (37.1 C) (Oral)   Resp 14   Wt 51.1 kg (112 lb 10.5 oz)   SpO2 97%   BMI 19.34 kg/m   General: Somnolent  Cardiovascular: Regular rate and rhythm, Q6-S3, 2/6 systolic ejection murmur  Respiratory: Clear to auscultation bilaterally  Right forearm and hand: Significant bruising with small areas of bruising. 1-2 plus pitting edema of the right hand although fingers are warm. With good Capillary refill and movement.  Discharge Instructions You were cared for by a hospitalist during your hospital stay. If you have any questions about your discharge medications or the care you received while you were in the hospital after you are discharged, you can call the unit and asked to speak with the hospitalist on call if the hospitalist that took care of you is not available. Once you are discharged, your primary care physician will handle any further medical issues. Please note that NO REFILLS for any discharge medications will be authorized once you are discharged, as it is imperative that you return to your primary care physician (or establish a relationship with a primary care physician if you do not have one) for your aftercare needs so that they can reassess your need for medications and monitor your lab values.  Discharge Instructions    DME Bedside commode    Complete by:  As directed    Patient needs a bedside commode to treat with the following  condition:  Fall   Diet - low sodium heart healthy    Complete by:  As directed    Face-to-face encounter (required for Medicare/Medicaid patients)    Complete by:  As directed    I CAMPOS,KEVIN M certify that this patient is under my care and that I, or a nurse practitioner or physician's assistant working with me, had a face-to-face encounter that meets the physician face-to-face encounter requirements with this patient on 11/09/2016. The encounter with the patient was in whole, or in part for the following medical condition(s) which is the primary reason for home health care (List medical condition): falls and right wrist fracture   The encounter with the patient was in whole, or in part, for the following medical condition, which is the primary reason for home health care:  falls, recent right wrist fracture   I certify that, based on my findings, the following services are medically necessary home health services:  Nursing   Reason for Medically Necessary Home Health Services:  Skilled Nursing- Teaching of Disease Process/Symptom Management   My clinical findings support the need for the above services:  Unsafe ambulation due to balance issues   Further, I certify that my clinical  findings support that this patient is homebound due to:  Unsafe ambulation due to balance issues   Home Health    Complete by:  As directed    To provide the following care/treatments:   PT [place] work     Increase activity slowly    Complete by:  As directed      Allergies as of 11/11/2016      Reactions   Adhesive [tape] Other (See Comments)   Sensitive to adhesives, must be removed with adhesive remover due to thin skin.   Sulfamethoxazole-trimethoprim Hives      Medication List    TAKE these medications   ALPRAZolam 0.5 MG tablet Commonly known as:  XANAX Take 1 tablet (0.5 mg total) by mouth 3 (three) times daily as needed for anxiety. What changed:  See the new  instructions.   aspirin EC 81 MG tablet Take 81 mg by mouth every 6 (six) hours as needed for moderate pain.   CALCIUM + D PO Take 1 tablet by mouth daily.   carvedilol 3.125 MG tablet Commonly known as:  COREG TAKE 1 TABLET BY MOUTH TWICE A DAY WITH A MEAL   CRANBERRY PO Take 2 capsules by mouth daily.   diphenoxylate-atropine 2.5-0.025 MG tablet Commonly known as:  LOMOTIL Take 1 tablet by mouth 2 (two) times daily.   escitalopram 10 MG tablet Commonly known as:  LEXAPRO Take 20 mg by mouth 2 (two) times daily.   HYDROcodone-acetaminophen 5-325 MG tablet Commonly known as:  NORCO Take 1 tablet by mouth every 6 (six) hours as needed for moderate pain. What changed:  how much to take  when to take this   nitroGLYCERIN 0.4 MG SL tablet Commonly known as:  NITROSTAT Place 1 tablet (0.4 mg total) under the tongue every 5 (five) minutes as needed. For chest pain.   omeprazole 20 MG capsule Commonly known as:  PRILOSEC Take 20 mg by mouth daily.   oxyCODONE 5 MG immediate release tablet Commonly known as:  Oxy IR/ROXICODONE Take 1 tablet (5 mg total) by mouth every 4 (four) hours as needed for moderate pain.   polyethylene glycol packet Commonly known as:  MIRALAX / GLYCOLAX Take 17 g by mouth daily as needed for mild constipation.   PROBIOTIC DAILY PO Take 1 tablet by mouth daily.   STOOL SOFTENER PO Take 1 tablet by mouth daily as needed (constipation).   traMADol 50 MG tablet Commonly known as:  ULTRAM Take 1 tablet (50 mg total) by mouth 3 (three) times daily as needed. What changed:  when to take this  reasons to take this   trolamine salicylate 10 % cream Commonly known as:  ASPERCREME Apply 1 application topically as needed (for back pain).   vitamin B-12 1000 MCG tablet Commonly known as:  CYANOCOBALAMIN Take 1,000 mcg by mouth daily.            Durable Medical Equipment        Start     Ordered   11/09/16 0000  DME Bedside commode     Question:  Patient needs a bedside commode to treat with the following condition  Answer:  Fall   11/09/16 1631     Allergies  Allergen Reactions  . Adhesive [Tape] Other (See Comments)    Sensitive to adhesives, must be removed with adhesive remover due to thin skin.  Octaviano Glow Hives    Contact information for follow-up providers  THOMPSON, DAVID A., MD Follow up.   Specialty:  Orthopedic Surgery Why:  office will call you on Monday to make the next appointment  Contact information: [place] [address] [phone]        Woody Seller, MD.   Specialty:  Family Medicine Why:  Please call your doctor for follow up in 2-5 days Contact information: [address] [place] [address] [phone]        [place] DEPT.   Specialty:  Emergency Medicine Why:  Return to ER for any new or worsening symptoms Contact information: [place] [id number] [place] [phone]           Contact information for after-discharge care    Destination    [facility] .   Specialty:  [place] information: [address] [place] [phone]                   The results of significant diagnostics from this hospitalization (including imaging, microbiology, ancillary and laboratory) are listed below for reference.    Significant Diagnostic Studies: Dg Ribs Unilateral W/chest Right  Result Date: 11/09/2016 CLINICAL DATA:  Fall at home this morning. Right-sided rib and chest pain. Initial encounter. EXAM: RIGHT RIBS AND CHEST - 3+ VIEW COMPARISON:  Chest radiograph on 11/21/2014 FINDINGS: No fracture or other bone lesions are seen involving the ribs. There is no evidence of pneumothorax or pleural effusion. Both lungs are clear. Heart size is normal. Stable ectasia of thoracic aorta.  Bilateral breast implants noted, as well as lumbar spine fusion hardware and previous upper lumbar vertebroplasty. IMPRESSION: No acute findings. Electronically Signed   By: Earle Gell M.D.   On: 11/09/2016 10:55   Dg Wrist Complete Right  Result Date: 11/09/2016 CLINICAL DATA:  Closed fracture distal radius postreduction EXAM: RIGHT WRIST - COMPLETE 3+ VIEW COMPARISON:  11/09/2016 FINDINGS: Three views of the right wrist submitted. Limited study by casting material artifact. Persistent comminuted fracture in distal right radius with dorsal angulation. No significant change in alignment from prior exam. IMPRESSION: Persistent comminuted fracture in distal right radius with dorsal angulation. No significant change in alignment from prior exam. Electronically Signed   By: Lahoma Crocker M.D.   On: 11/09/2016 17:01   Dg Wrist Complete Right  Result Date: 11/09/2016 CLINICAL DATA:  Postreduction films. EXAM: RIGHT WRIST - COMPLETE 3+ VIEW COMPARISON:  Earlier the same day FINDINGS: Fine bony detail obscured by the overlying plaster. Fracture the distal radial metaphysis again noted. Bony alignment similar to the pre reduction films. Bones appear demineralized. IMPRESSION: No substantial interval change status post closed reduction. Electronically Signed   By: Misty Stanley M.D.   On: 11/09/2016 14:35   Dg Wrist Complete Right  Result Date: 11/09/2016 CLINICAL DATA:  Fall at home this a.m landing onto rt side complains of rt sided chest pain, rt wrist pain with deformity EXAM: RIGHT WRIST - COMPLETE 3+ VIEW COMPARISON:  None. FINDINGS: Fracture of the distal radial metaphysis with dorsal angulation. Fracture does not appear to enter the articular surface. Radiocarpal joint is intact. Mild comminution of the fracture. Fracture of the ulnar styloid age-indeterminate. IMPRESSION: 1. Comminuted fracture of the distal radius with dorsal angulation. 2. Radiocarpal joint intact. 3. Age-indeterminate ulnar styloid  fracture. Electronically Signed   By: Suzy Bouchard M.D.   On: 11/09/2016 10:55   Ct Head Wo Contrast  Result  Date: 11/09/2016 CLINICAL DATA:  Patient presents to the emergency department after tripping over her cat food bowl this morning and landing on her right wrist and right lateral chest. She presents with obvious deformity of her right wrist. She denies head injury or headache at this time. She denies neck pain. She denies recent illness. She is alert and oriented 4. She presents via EMS. She denies hip pain. Her only complaint is focused right wrist pain. She denies neck pain EXAM: CT HEAD WITHOUT CONTRAST CT CERVICAL SPINE WITHOUT CONTRAST TECHNIQUE: Multidetector CT imaging of the head and cervical spine was performed following the standard protocol without intravenous contrast. Multiplanar CT image reconstructions of the cervical spine were also generated. COMPARISON:  11/20/2014 FINDINGS: CT HEAD FINDINGS Brain: No evidence of acute infarction, hemorrhage, hydrocephalus, extra-axial collection or mass lesion/mass effect. Vascular: No hyperdense vessel or unexpected calcification. Skull: Normal. Negative for fracture or focal lesion. Sinuses/Orbits: No acute finding. Other: None. CT CERVICAL SPINE FINDINGS Alignment: Hyperextended but otherwise unremarkable with no spondylolisthesis. Skull base and vertebrae: No acute fracture. No primary bone lesion or focal pathologic process. Soft tissues and spinal canal: No prevertebral fluid or swelling. No visible canal hematoma. Disc levels: Mild to moderate loss disc height at C3-C4. Moderate loss disc height at C5-C6 and C6-C7. Endplate spurring at these levels. Bilateral facet degenerative change. Bones are demineralized. There are mild areas of disc bulging, but no convincing disc herniation. Upper chest: Apical pleuroparenchymal scarring.  No acute findings. Other: None. IMPRESSION: HEAD CT:  No acute intracranial abnormalities.  No skull fracture.  CERVICAL CT:  No fracture or acute finding. Electronically Signed   By: Lajean Manes M.D.   On: 11/09/2016 17:54   Ct Cervical Spine Wo Contrast  Result Date: 11/09/2016 CLINICAL DATA:  Patient presents to the emergency department after tripping over her cat food bowl this morning and landing on her right wrist and right lateral chest. She presents with obvious deformity of her right wrist. She denies head injury or headache at this time. She denies neck pain. She denies recent illness. She is alert and oriented 4. She presents via EMS. She denies hip pain. Her only complaint is focused right wrist pain. She denies neck pain EXAM: CT HEAD WITHOUT CONTRAST CT CERVICAL SPINE WITHOUT CONTRAST TECHNIQUE: Multidetector CT imaging of the head and cervical spine was performed following the standard protocol without intravenous contrast. Multiplanar CT image reconstructions of the cervical spine were also generated. COMPARISON:  11/20/2014 FINDINGS: CT HEAD FINDINGS Brain: No evidence of acute infarction, hemorrhage, hydrocephalus, extra-axial collection or mass lesion/mass effect. Vascular: No hyperdense vessel or unexpected calcification. Skull: Normal. Negative for fracture or focal lesion. Sinuses/Orbits: No acute finding. Other: None. CT CERVICAL SPINE FINDINGS Alignment: Hyperextended but otherwise unremarkable with no spondylolisthesis. Skull base and vertebrae: No acute fracture. No primary bone lesion or focal pathologic process. Soft tissues and spinal canal: No prevertebral fluid or swelling. No visible canal hematoma. Disc levels: Mild to moderate loss disc height at C3-C4. Moderate loss disc height at C5-C6 and C6-C7. Endplate spurring at these levels. Bilateral facet degenerative change. Bones are demineralized. There are mild areas of disc bulging, but no convincing disc herniation. Upper chest: Apical pleuroparenchymal scarring.  No acute findings. Other: None. IMPRESSION: HEAD CT:  No acute  intracranial abnormalities.  No skull fracture. CERVICAL CT:  No fracture or acute finding. Electronically Signed   By: Lajean Manes M.D.   On: 11/09/2016 17:54   Ct Angio Chest/abd/pel  For Dissection W And/or Wo Contrast  Result Date: 11/09/2016 CLINICAL DATA:  Syncope with hypertension and hypoxia. Evaluate for aortic dissection. EXAM: CT ANGIOGRAPHY CHEST, ABDOMEN AND PELVIS TECHNIQUE: Multidetector CT imaging through the chest, abdomen and pelvis was performed using the standard protocol during bolus administration of intravenous contrast. Multiplanar reconstructed images and MIPs were obtained and reviewed to evaluate the vascular anatomy. CONTRAST:  100 CC Isovue 370 COMPARISON:  None. FINDINGS: CTA CHEST FINDINGS Study is markedly motion degraded. Cardiovascular: Precontrast imaging shows no hyperdense crescent in the wall of the thoracic aorta to suggest acute intramural hematoma. Ascending thoracic aorta measures approximately 3.3 cm diameter. Within the limitation of 9 gated technique and substantial patient motion, no definite dissection flap is identified in the thoracic aorta. Heart size is normal. No pericardial effusion. No large central pulmonary embolus in the main pulmonary outflow tract or either main pulmonary artery. Mediastinum/Nodes: No mediastinal lymphadenopathy. There is no hilar lymphadenopathy. There is no axillary lymphadenopathy. Lungs/Pleura: No focal airspace consolidation. No overt pulmonary edema. No substantial pleural effusion. Given the extreme motion artifact, small pulmonary nodules could be obscured. Musculoskeletal: No gross lytic or sclerotic osseous abnormality is identified. Assessment of bony anatomy for fracture is markedly limited by the motion artifact. Review of the MIP images confirms the above findings. CTA ABDOMEN AND PELVIS FINDINGS VASCULAR Aorta: No thoracic aortic aneurysm. Celiac: Patent. Assessment for stenosis not possible due to motion artifact. SMA:  Patent. Assessment for proximal stenosis not possible due to motion artifact. Renals: Patent bilaterally. Accessory left renal artery noted. Fine detail obscured by motion artifact. IMA: Not visualized but there is substantial motion artifact in this region. Veins: Portal vein and superior mesenteric vein are patent. IVC appears patent although assessment is degraded by streak artifact from spinal fixation hardware and motion artifact. Review of the MIP images confirms the above findings. NON-VASCULAR Hepatobiliary: No gross abnormality in the liver parenchyma. There is no evidence for gallstones, gallbladder wall thickening, or pericholecystic fluid. No intrahepatic or extrahepatic biliary dilation. Pancreas: Limited assessment due to motion without focal abnormality identified. No dilatation of the main pancreatic duct. Spleen: Unremarkable. Adrenals/Urinary Tract: No adrenal nodule or mass. No hydronephrosis or gross renal mass evident. No evidence for hydroureter. Bladder is obscured by streak artifact from right hip replacement. Stomach/Bowel: Stomach is nondistended. Duodenum not well evaluated. No evidence for small bowel obstruction. No evidence for colonic obstruction. Bowel extends into a right groin hernia but cannot be clearly discerned given streak artifact from the hip replacement and patient motion. Lymphatic: No gross lymphadenopathy can be identified in the abdomen or pelvis although right pelvic sidewall not well evaluated. Reproductive: Obscured. Other: No substantial intraperitoneal free fluid. Musculoskeletal: Diffuse demineralization. Right hip replacement. Old inferior right pubic ramus fracture. Status post lower lumbar fusion. Multilevel thoracolumbar compression fractures. Review of the MIP images confirms the above findings. IMPRESSION: 1. Study is markedly degraded by patient motion throughout image acquisition imaging through the pelvis further degraded by lumbar fusion hardware and  right hip replacement. 2. Within the above-stated limitation, no definite dissection is identified in the thoraco abdominal aorta. There is no thoracoabdominal aortic aneurysm. 3. Right groin hernia contains bowel but is largely obscured by streak artifact and motion artifact. No findings to suggest bowel obstruction at this time. 4. Diffuse bony demineralization with multiple thoracolumbar compression fractures. These are age-indeterminate and cannot be further evaluated given the marked motion degradation. Electronically Signed   By: Misty Stanley M.D.   On: 11/09/2016  21:25    Microbiology: Recent Results (from the past 240 hour(s))  MRSA PCR Screening     Status: None   Collection Time: 11/10/16  6:03 AM  Result Value Ref Range Status   MRSA by PCR NEGATIVE NEGATIVE Final    Comment:        The GeneXpert MRSA Assay (FDA approved for NASAL specimens only), is one component of a comprehensive MRSA colonization surveillance program. It is not intended to diagnose MRSA infection nor to guide or monitor treatment for MRSA infections.      Labs: Basic Metabolic Panel:  Recent Labs Lab 11/09/16 1018 11/09/16 2218 11/10/16 0524  NA 136  --  135  K 3.3*  --  3.6  CL 101  --  103  CO2 28  --  23  GLUCOSE 143*  --  139*  BUN 13  --  11  CREATININE 0.85  --  0.66  CALCIUM 8.7*  --  8.5*  MG  --  1.6*  --    Liver Function Tests: No results for input(s): AST, ALT, ALKPHOS, BILITOT, PROT, ALBUMIN in the last 168 hours. No results for input(s): LIPASE, AMYLASE in the last 168 hours. No results for input(s): AMMONIA in the last 168 hours. CBC:  Recent Labs Lab 11/09/16 1018 11/10/16 0524 11/11/16 0819  WBC 12.2* 10.0 7.8  HGB 11.3* 10.2* 8.6*  HCT 33.7* 29.8* 25.3*  MCV 91.3 90.6 91.7  PLT 245 215 183   Cardiac Enzymes:  Recent Labs Lab 11/09/16 2218 11/10/16 0524 11/10/16 1044  TROPONINI <0.03 0.03* 0.03*   BNP: BNP (last 3 results)  Recent Labs   11/10/16 1747  BNP 224.0*    ProBNP (last 3 results) No results for input(s): PROBNP in the last 8760 hours.  CBG:  Recent Labs Lab 11/10/16 0616 11/10/16 0737 11/11/16 0752  GLUCAP 124* 122* 100*       Signed:  Annita Brod, MD [facility] 11/11/2016, 3:23 PM

## 2016-11-11 NOTE — Progress Notes (Signed)
Tried to call report to [facility] on several occasions.  Will continue to try before end of shift.

## 2016-11-11 NOTE — Clinical Social Work Placement (Signed)
   CLINICAL SOCIAL WORK PLACEMENT  NOTE  Date:  11/11/2016  Patient Details  Name: [name] MRN: [id number] Date of Birth: 1934/11/18  Clinical Social Work is seeking post-discharge placement for this patient at the [place] level of care (*CSW will initial, date and re-position this form in  chart as items are completed):  Yes   Patient/family provided with [place] Work Department's list of facilities offering this level of care within the geographic area requested by the patient (or if unable, by the patient's family).  Yes   Patient/family informed of their freedom to choose among providers that offer the needed level of care, that participate in Medicare, Medicaid or managed care program needed by the patient, have an available bed and are willing to accept the patient.  Yes   Patient/family informed of [place]'s ownership interest in [facility] and [facility], as well as of the fact that they are under no obligation to receive care at these facilities.  PASRR submitted to EDS on 11/11/16     PASRR number received on 11/11/16     Existing PASRR number confirmed on       FL2 transmitted to all facilities in geographic area requested by pt/family on       FL2 transmitted to all facilities within larger geographic area on       Patient informed that his/her managed care company has contracts with or will negotiate with certain facilities, including the following:        Yes   Patient/family informed of bed offers received.  Patient chooses bed at [facility]     Physician recommends and patient chooses bed at      Patient to be transferred to [facility] on 11/11/16.  Patient to be transferred to facility by ptar     Patient family notified on 11/11/16 of transfer.  Name of family member notified:  sheila     PHYSICIAN Please sign FL2     Additional Comment:     _______________________________________________ Jorge Ny, LCSW 11/11/2016, 2:29 PM

## 2016-11-11 NOTE — Progress Notes (Addendum)
Patient will discharge to [facility] Anticipated discharge date: 4/22 Family notified: dtr [name] Transportation by Corey Harold- scheduled for 3pm Report #: [phone]  [place] signing off.  Jorge Ny, LCSW Clinical Social Worker [phone]

## 2016-11-11 NOTE — NC FL2 (Signed)
[place] LEVEL OF CARE SCREENING TOOL     IDENTIFICATION  Patient Name: [name] Birthdate: 10-11-1934 Sex: female Admission Date (Current Location): 11/09/2016  [facility] and [place] Number:  Herbalist and Address:  [facility] [place], [place] [address] [place], [place] [address]      Provider Number: [id number]  Attending Physician Name and Address:  Annita Brod, MD  Relative Name and Phone Number:       Current Level of Care: Hospital Recommended Level of Care: [place] Prior Approval Number:    Date Approved/Denied:   PASRR Number: [id number]  Discharge Plan: SNF    Current Diagnoses: Patient Active Problem List   Diagnosis Date Noted  . Leucocytosis 11/10/2016  . Traumatic closed nondisp torus fracture of distal radial metaphysis, right, sequela 11/10/2016  . Chronic diastolic heart failure ([place]) 11/10/2016  . Hypoxia 11/09/2016  . Dementia 11/09/2016  . Debility 11/09/2016  . Anxiety state 11/23/2014  . Generalized anxiety disorder 11/22/2014  . Chronic diarrhea 11/22/2014  . Positive D dimer   . Essential hypertension 11/21/2014  . CAD (coronary artery disease) 11/21/2014  . Fall   . Acute respiratory failure with hypoxia and hypercapnia (HCC)   . Syncope 11/20/2014  . Lumbar compression fracture ([place]) 11/05/2013  . Thrombocytosis ([place]) 06/21/2013  . Acute delirium 06/15/2013  . Hypokalemia 06/14/2013  . Acute blood loss anemia 06/11/2013  . Protein-calorie malnutrition, severe ([place]) 06/10/2013  . Dehydration 06/09/2013  . GAD (generalized anxiety disorder) 06/09/2013  . HYPERLIPIDEMIA 05/25/2009  . TAKOTSUBO SYNDROME 05/25/2009  . GERD 05/25/2009  . DIVERTICULAR DISEASE 05/25/2009  . OSTEOARTHRITIS, KNEE, RIGHT 05/25/2009  . IRRITABLE BOWEL SYNDROME, HX OF 05/25/2009    Orientation RESPIRATION BLADDER Height & Weight     Self  O2 (2L [place]) Continent Weight: 112 lb 10.5 oz (51.1  kg) Height:     BEHAVIORAL SYMPTOMS/MOOD NEUROLOGICAL BOWEL NUTRITION STATUS      Continent Diet (cardiac)  AMBULATORY STATUS COMMUNICATION OF NEEDS Skin   Total Care Verbally Surgical wounds                       Personal Care Assistance Level of Assistance  Bathing, Dressing, Feeding Bathing Assistance: Maximum assistance Feeding assistance: Limited assistance Dressing Assistance: Maximum assistance     Functional Limitations Info             SPECIAL CARE FACTORS FREQUENCY  PT (By licensed PT), OT (By licensed OT)     PT Frequency: 5/wk OT Frequency: 5/wk            Contractures      Additional Factors Info  Code Status, Allergies, Isolation Precautions Code Status Info: FULL Allergies Info: Adhesive Tape, Sulfamethoxazole-trimethoprim     Isolation Precautions Info: MRSA     Current Medications (11/11/2016):  This is the current hospital active medication list Current Facility-Administered Medications  Medication Dose Route Frequency Provider Last Rate Last Dose  . ALPRAZolam Duanne Moron) tablet 0.5 mg  0.5 mg Oral BID PRN Toy Baker, MD   0.5 mg at 11/11/16 0624  . bisacodyl (DULCOLAX) suppository 10 mg  10 mg Rectal Daily PRN Toy Baker, MD      . carvedilol (COREG) tablet 3.125 mg  3.125 mg Oral BID WC Toy Baker, MD   3.125 mg at 11/10/16 1807  . escitalopram (LEXAPRO) tablet 20 mg  20 mg Oral BID Toy Baker, MD   20 mg  at 11/10/16 2130  . heparin injection 5,000 Units  5,000 Units Subcutaneous Q8H Toy Baker, MD   5,000 Units at 11/11/16 0813  . MEDLINE mouth rinse  15 mL Mouth Rinse BID Toy Baker, MD   15 mL at 11/10/16 2131  . morphine 4 MG/ML injection 1 mg  1 mg Intravenous Q4H PRN Toy Baker, MD      . oxyCODONE (Oxy IR/ROXICODONE) immediate release tablet 5 mg  5 mg Oral Q4H PRN Toy Baker, MD   5 mg at 11/11/16 8871  . pantoprazole (PROTONIX) EC tablet 40 mg  40 mg Oral Daily  Toy Baker, MD   40 mg at 11/10/16 0900  . polyethylene glycol (MIRALAX / GLYCOLAX) packet 17 g  17 g Oral Daily PRN Toy Baker, MD      . sodium chloride flush (NS) 0.9 % injection 3 mL  3 mL Intravenous Q12H Toy Baker, MD   3 mL at 11/10/16 2130  . traMADol (ULTRAM) tablet 50 mg  50 mg Oral BID Toy Baker, MD   50 mg at 11/10/16 2130     Discharge Medications: Please see discharge summary for a list of discharge medications.  Relevant Imaging Results:  Relevant Lab Results:   Additional Information SS#: [id number]  Jorge Ny, LCSW

## 2016-11-13 ENCOUNTER — Encounter (HOSPITAL_COMMUNITY): Payer: Self-pay | Admitting: *Deleted

## 2016-11-13 ENCOUNTER — Other Ambulatory Visit: Payer: Self-pay | Admitting: Orthopedic Surgery

## 2016-11-13 NOTE — H&P (Signed)
[name] is an 82 y.o. female.   Chief Complaint: RIGHT DISTAL RADIUS COMMINUTED FRACTURE  HPI: [name] IS A 81 Y/O FEMALE WHO INJURED HER RIGHT WRIST ON 11/09/16 AFTER TRIPPING OVER HER CAT'S FOOD BOWL.  SHE WAS SEEN IN THE EMERGENCY DEPARTMENT AND A REDUCTION WAS ATTEMPTED. THE PATIENT WAS PUT INTO A SUGAR TONG SPLINT.  SHE PRESENTED TO OUR OFFICE FOR FURTHER EVALUATION.  DISCUSSED THE REASON AND RATIONALE FOR SURGERY. DISCUSSED THE SURGICAL PROCEDURE, INCLUDING THE RISKS VERSUS BENEFITS, AND THE POST-OPERATIVE RECOVERY PROCESS.  [name] IS HERE TODAY FOR SURGERY.   Past Medical History:  Diagnosis Date  . Anemia   . Anxiety    confusion  . Blood transfusion    2005 maybe  . Coronary artery disease   . Depression   . Diverticular disease   . Esophageal reflux   . Falls frequently   . Headache(784.0)    takes Prilosec  . Hypertension   . IBS (irritable bowel syndrome)   . Osteoarthrosis, unspecified whether generalized or localized, lower leg   . Other and unspecified hyperlipidemia   . Shortness of breath    when my heart is not doing right  . Takotsubo syndrome     Past Surgical History:  Procedure Laterality Date  . BACK SURGERY    . BREAST RECONSTRUCTION    . CATARACT EXTRACTION W/ INTRAOCULAR LENS  IMPLANT, BILATERAL    . FRACTURE SURGERY    . HIP ARTHROPLASTY Right 06/10/2013   Procedure: ARTHROPLASTY MONOPOLAR HIP CEMENTED;  Surgeon: Marybelle Killings, MD;  Location: [facility];  Service: Orthopedics;  Laterality: Right;  . HYSTEROTOMY    . IMPLANTABLE CONTACT LENS IMPLANTATION    . JOINT REPLACEMENT    . KNEE SURGERY    . KYPHOPLASTY N/A 11/05/2013   Procedure: Jone Baseman TWO;  Surgeon: Ophelia Charter, MD;  Location: [place] [facility] OR[facility];  Service: Neurosurgery;  Laterality: N/A;  . TONSILLECTOMY     as child    Family History  Problem Relation Age of Onset  . Heart failure Mother   . Stroke Sister   . Lung cancer Brother   . Lung cancer Other    Social History:   reports that she has never smoked. She has never used smokeless tobacco. She reports that she does not drink alcohol or use drugs.  Allergies:  Allergies  Allergen Reactions  . Adhesive [Tape] Other (See Comments)    Sensitive to adhesives, must be removed with adhesive remover due to thin skin.  . Sulfamethoxazole-Trimethoprim Hives    No prescriptions prior to admission.    No results found for this or any previous visit (from the past 48 hour(s)). No results found.  ROS NO RECENT ILLNESSES OR HOSPITALIZATIONS  There were no vitals taken for this visit. Physical Exam  General Appearance:  Alert, cooperative, no distress, appears stated age  Head:  Normocephalic, without obvious abnormality, atraumatic  Eyes:  Pupils equal, conjunctiva/corneas clear,         Throat: Lips, mucosa, and tongue normal; teeth and gums normal  Neck: No visible masses     Lungs:   respirations unlabored  Chest Wall:  No tenderness or deformity  Heart:  Regular rate and rhythm,  Abdomen:   Soft, non-tender,         Extremities: RUE: SPLINT INTACT, FINGERS WARM WELL PERFUSED GOOD CAP REFIL  Pulses: 2+ and symmetric  Skin: Skin color, texture, turgor normal, no rashes or lesions     Neurologic:  Normal    Assessment RIGHT DISTAL RADIUS COMMINUTED DISPLACED FRACTURE  Plan RIGHT DISTAL RADIUS OPEN REDUCTION AND INTERNAL FIXATION WITH REPAIR AS INDICATED R/B/A DISCUSSED WITH PT IN OFFICE.  PT VOICED UNDERSTANDING OF PLAN CONSENT SIGNED DAY OF SURGERY PT SEEN AND EXAMINED PRIOR TO OPERATIVE PROCEDURE/DAY OF SURGERY SITE MARKED. QUESTIONS ANSWERED WILL REMAIN OBSERVATION FOLLOWING SURGERY  WE ARE PLANNING SURGERY FOR YOUR UPPER EXTREMITY. THE RISKS AND BENEFITS OF SURGERY INCLUDE BUT NOT LIMITED TO BLEEDING INFECTION, DAMAGE TO NEARBY NERVES ARTERIES TENDONS, FAILURE OF SURGERY TO ACCOMPLISH ITS INTENDED GOALS, PERSISTENT SYMPTOMS AND NEED FOR FURTHER SURGICAL INTERVENTION. WITH THIS IN MIND  WE WILL PROCEED. I HAVE DISCUSSED WITH THE PATIENT THE PRE AND POSTOPERATIVE REGIMEN AND THE DOS AND DON'TS. PT VOICED UNDERSTANDING AND INFORMED CONSENT SIGNED. [name] 11/13/2016, 3:53 PM

## 2016-11-13 NOTE — Pre-Procedure Instructions (Signed)
    [name]  11/13/2016       Your procedure is scheduled on Wednesday, November 14, 2016  Report to [facility] Admitting at 2:00 P.M.  Call this number if you have problems the morning of surgery:  [phone]   Remember:  Do not eat food or drink liquids after 7:30 A.M.  Take these medicines the morning of surgery with A SIP OF WATER : carvedilol (COREG), escitalopram (LEXAPRO), omeprazole (PRILOSEC), if needed: pain medication, ALPRAZolam (XANAX) for anxiety, nitroGLYCERIN  for chest pain Stop taking Aspirin, vitamins, fish oil, Cranberry and herbal medications. Do not take any NSAIDs ie: Ibuprofen, Advil, Naproxen, BC and Goody Powder; stop now.    Do not wear jewelry, make-up or nail polish.  Do not wear lotions, powders, or perfumes, or deoderant.  Do not shave 48 hours prior to surgery.   Do not bring valuables to the hospital.  [facility] is not responsible for any belongings or valuables.  Contacts, dentures or bridgework may not be worn into surgery.  Leave your suitcase in the car.  After surgery it may be brought to your room.  For patients admitted to the hospital, discharge time will be determined by your treatment team.  Patients discharged the day of surgery will not be allowed to drive home.   Please read over the following fact sheets that you were given.

## 2016-11-13 NOTE — Progress Notes (Signed)
Pt oncoming nurse, Misti, LPN, reviewed and confirmed receipt of fax. Nurse verbalized understanding of all pre-op instructions.

## 2016-11-13 NOTE — Progress Notes (Signed)
Anesthesia Chart Review:  Pt is a same day work up.   Pt is a 82 year old female scheduled for ORIF distal radial fracture on 11/14/2016 with Iran Planas, M.D.  - Cardiologist is Sherren Mocha, MD, last office visit 06/10/15.    PMH includes: CAD (mild by 2007 cath), takotsubo syndrome (EFrecovered), HTN, hyperlipidemia, anemia, dementia, GERD. Never smoker. BMI 19.   S/p L2 kyphoplasty 11/05/13. S/p R hip arthroplasty 06/10/13. S/p lumbar fusion 08/08/11.   - Pt hospitalized 4/20-4/22/18 for fall, distal radial fx, hypoxia, dementia, debility, anemia.  Pt was found on the floor by her daughter, unknown how long pt was lying on floor.   Medications include: ASA 81 mg, carvedilol, Prilosec  Labs from hospitalization 4/21-22/18 reviewed: Hgb was 8.6 at discharge, 11.3 upon arrival to ER 2 days prior.  No blood loss suspected, likely is chronically anemic and was heme concentrated upon arrival.  Will need to repeat CBC DOS.   CT chest 11/09/16:  1. Study is markedly degraded by patient motion throughout image acquisition imaging through the pelvis further degraded by lumbar fusion hardware and right hip replacement. 2. Within the above-stated limitation, no definite dissection is identified in the thoraco abdominal aorta. There is no thoracoabdominal aortic aneurysm. 3. Right groin hernia contains bowel but is largely obscured by streak artifact and motion artifact. No findings to suggest bowel obstruction at this time. 4. Diffuse bony demineralization with multiple thoracolumbar compression fractures. These are age-indeterminate and cannot be further evaluated given the marked motion degradation.  EKG 11/10/16: Sinus rhythm with frequent PVCs. QT prolonged.   Echo 11/10/16:  - Left ventricle: The cavity size was normal. There was mild focal basal hypertrophy of the septum. Systolic function was normal. The estimated ejection fraction was in the range of 60% to 65%. Wall motion was normal; there  were no regional wall motion abnormalities. Doppler parameters are consistent with abnormal left ventricular relaxation (grade 1 diastolic dysfunction). - Aortic valve: Mildly calcified annulus. Trileaflet; mildly calcified leaflets. - Mitral valve: Calcified annulus. There was trivial regurgitation. - Right atrium: Central venous pressure (est): 3 mm Hg. - Tricuspid valve: There was mild regurgitation. - Pulmonary arteries: PA peak pressure: 25 mm Hg (S). - Pericardium, extracardiac: A small pericardial effusion was identified anterior to the heart. - Impressions: Mild basal septal LV hypertrophy with LVEF 60-65% and grade 1 diastolic dysfunction. Mildly calcified mitral and aortic annulus. Trivial mitral and mild tricuspid regurgitation. Small anterior pericardial effusion.  Nuclear stress test 10/24/10: Normal stress nuclear study. and With stress there is no chest pain and no significant EKG change.  Cardiac cath 03/13/06:  1. Severe segmental left ventricular dysfunction consistent with apical ballooning pattern. 2. Moderate mid LAD disease with a prominent intramyocardial bridge. 3. Normal left CX artery. 4. Normal RCA.  If labs acceptable DOS, I anticipate pt can proceed as scheduled.   Willeen Cass, FNP-BC [facility] Short Stay Surgical Center/Anesthesiology Phone: [phone] 11/13/2016 4:00 PM

## 2016-11-13 NOTE — Progress Notes (Signed)
Pt SDW-pre-op call completed by pt nurse Isabella Bowens, RN. Nurse denies that pt C/O SOB and chest pain. Pre-op instructions faxed to pt nurse, Misti. Please complete anesthesia assessment and screening for sleep apnea on DOS; unable to assess at this time. Dr. Marcie Bal, Anesthesia  advised that pt be completed breakfast by 7:30 A.M. When made aware that pt surgery is scheduleed for 1630. Anesthesia asked to review pt history ( see note).

## 2016-11-14 ENCOUNTER — Encounter: Payer: Self-pay | Admitting: Internal Medicine

## 2016-11-14 ENCOUNTER — Non-Acute Institutional Stay (SKILLED_NURSING_FACILITY): Payer: Medicare Other | Admitting: Internal Medicine

## 2016-11-14 ENCOUNTER — Ambulatory Visit (HOSPITAL_COMMUNITY): Payer: Medicare Other | Admitting: Emergency Medicine

## 2016-11-14 ENCOUNTER — Ambulatory Visit (HOSPITAL_COMMUNITY)
Admission: RE | Admit: 2016-11-14 | Discharge: 2016-11-16 | Disposition: A | Discharge status: Skilled Nursing Facility | Payer: Medicare Other | Source: Ambulatory Visit | Attending: Orthopedic Surgery | Admitting: Orthopedic Surgery

## 2016-11-14 ENCOUNTER — Ambulatory Visit (HOSPITAL_COMMUNITY): Payer: Medicare Other

## 2016-11-14 ENCOUNTER — Encounter (HOSPITAL_COMMUNITY)
Admission: RE | Disposition: A | Discharge status: Skilled Nursing Facility | Payer: Self-pay | Source: Ambulatory Visit | Attending: Orthopedic Surgery

## 2016-11-14 ENCOUNTER — Encounter (HOSPITAL_COMMUNITY): Payer: Self-pay | Admitting: *Deleted

## 2016-11-14 DIAGNOSIS — M810 Age-related osteoporosis without current pathological fracture: Secondary | ICD-10-CM | POA: Diagnosis not present

## 2016-11-14 DIAGNOSIS — I10 Essential (primary) hypertension: Secondary | ICD-10-CM | POA: Diagnosis present

## 2016-11-14 DIAGNOSIS — M6281 Muscle weakness (generalized): Secondary | ICD-10-CM | POA: Insufficient documentation

## 2016-11-14 DIAGNOSIS — Z79899 Other long term (current) drug therapy: Secondary | ICD-10-CM | POA: Insufficient documentation

## 2016-11-14 DIAGNOSIS — F0391 Unspecified dementia with behavioral disturbance: Secondary | ICD-10-CM | POA: Insufficient documentation

## 2016-11-14 DIAGNOSIS — Z96641 Presence of right artificial hip joint: Secondary | ICD-10-CM | POA: Insufficient documentation

## 2016-11-14 DIAGNOSIS — F411 Generalized anxiety disorder: Secondary | ICD-10-CM

## 2016-11-14 DIAGNOSIS — F0390 Unspecified dementia without behavioral disturbance: Secondary | ICD-10-CM

## 2016-11-14 DIAGNOSIS — W010XXA Fall on same level from slipping, tripping and stumbling without subsequent striking against object, initial encounter: Secondary | ICD-10-CM | POA: Insufficient documentation

## 2016-11-14 DIAGNOSIS — E876 Hypokalemia: Secondary | ICD-10-CM | POA: Insufficient documentation

## 2016-11-14 DIAGNOSIS — I2510 Atherosclerotic heart disease of native coronary artery without angina pectoris: Secondary | ICD-10-CM

## 2016-11-14 DIAGNOSIS — I5032 Chronic diastolic (congestive) heart failure: Secondary | ICD-10-CM

## 2016-11-14 DIAGNOSIS — F329 Major depressive disorder, single episode, unspecified: Secondary | ICD-10-CM | POA: Diagnosis not present

## 2016-11-14 DIAGNOSIS — R410 Disorientation, unspecified: Secondary | ICD-10-CM | POA: Diagnosis present

## 2016-11-14 DIAGNOSIS — E440 Moderate protein-calorie malnutrition: Secondary | ICD-10-CM

## 2016-11-14 DIAGNOSIS — K589 Irritable bowel syndrome without diarrhea: Secondary | ICD-10-CM | POA: Insufficient documentation

## 2016-11-14 DIAGNOSIS — Z7982 Long term (current) use of aspirin: Secondary | ICD-10-CM | POA: Diagnosis not present

## 2016-11-14 DIAGNOSIS — K219 Gastro-esophageal reflux disease without esophagitis: Secondary | ICD-10-CM | POA: Insufficient documentation

## 2016-11-14 DIAGNOSIS — S52521K Torus fracture of lower end of right radius, subsequent encounter for fracture with nonunion: Secondary | ICD-10-CM | POA: Diagnosis not present

## 2016-11-14 DIAGNOSIS — I110 Hypertensive heart disease with heart failure: Secondary | ICD-10-CM | POA: Diagnosis not present

## 2016-11-14 DIAGNOSIS — R4182 Altered mental status, unspecified: Secondary | ICD-10-CM | POA: Diagnosis not present

## 2016-11-14 DIAGNOSIS — D649 Anemia, unspecified: Secondary | ICD-10-CM | POA: Diagnosis not present

## 2016-11-14 DIAGNOSIS — F05 Delirium due to known physiological condition: Secondary | ICD-10-CM | POA: Insufficient documentation

## 2016-11-14 DIAGNOSIS — S52571A Other intraarticular fracture of lower end of right radius, initial encounter for closed fracture: Secondary | ICD-10-CM | POA: Insufficient documentation

## 2016-11-14 DIAGNOSIS — K5900 Constipation, unspecified: Secondary | ICD-10-CM

## 2016-11-14 DIAGNOSIS — R2681 Unsteadiness on feet: Secondary | ICD-10-CM | POA: Insufficient documentation

## 2016-11-14 DIAGNOSIS — S52501A Unspecified fracture of the lower end of right radius, initial encounter for closed fracture: Secondary | ICD-10-CM | POA: Diagnosis present

## 2016-11-14 DIAGNOSIS — R269 Unspecified abnormalities of gait and mobility: Secondary | ICD-10-CM | POA: Insufficient documentation

## 2016-11-14 DIAGNOSIS — E785 Hyperlipidemia, unspecified: Secondary | ICD-10-CM | POA: Insufficient documentation

## 2016-11-14 DIAGNOSIS — Z882 Allergy status to sulfonamides status: Secondary | ICD-10-CM | POA: Insufficient documentation

## 2016-11-14 DIAGNOSIS — R41841 Cognitive communication deficit: Secondary | ICD-10-CM | POA: Insufficient documentation

## 2016-11-14 DIAGNOSIS — F32A Depression, unspecified: Secondary | ICD-10-CM

## 2016-11-14 HISTORY — PX: OPEN REDUCTION INTERNAL FIXATION (ORIF) DISTAL RADIAL FRACTURE: SHX5989

## 2016-11-14 HISTORY — DX: Fracture of unspecified carpal bone, unspecified wrist, initial encounter for closed fracture: S62.109A

## 2016-11-14 LAB — URINALYSIS, ROUTINE W REFLEX MICROSCOPIC
Bilirubin Urine: NEGATIVE
GLUCOSE, UA: NEGATIVE mg/dL
HGB URINE DIPSTICK: NEGATIVE
KETONES UR: 20 mg/dL — AB
Leukocytes, UA: NEGATIVE
Nitrite: NEGATIVE
PROTEIN: 30 mg/dL — AB
Specific Gravity, Urine: 1.028 (ref 1.005–1.030)
pH: 5 (ref 5.0–8.0)

## 2016-11-14 LAB — BASIC METABOLIC PANEL
Anion gap: 10 (ref 5–15)
BUN: 14 mg/dL (ref 6–20)
CHLORIDE: 105 mmol/L (ref 101–111)
CO2: 23 mmol/L (ref 22–32)
CREATININE: 0.72 mg/dL (ref 0.44–1.00)
Calcium: 8.8 mg/dL — ABNORMAL LOW (ref 8.9–10.3)
GFR calc Af Amer: >60 mL/min (ref >60)
GLUCOSE: 119 mg/dL — AB (ref 65–99)
POTASSIUM: 3.1 mmol/L — AB (ref 3.5–5.1)
Sodium: 138 mmol/L (ref 135–145)

## 2016-11-14 LAB — COMPREHENSIVE METABOLIC PANEL
ALT: 18 U/L (ref 14–54)
ANION GAP: 6 (ref 5–15)
AST: 21 U/L (ref 15–41)
Albumin: 3 g/dL — ABNORMAL LOW (ref 3.5–5.0)
Alkaline Phosphatase: 68 U/L (ref 38–126)
BILIRUBIN TOTAL: 0.9 mg/dL (ref 0.3–1.2)
BUN: 12 mg/dL (ref 6–20)
CO2: 29 mmol/L (ref 22–32)
Calcium: 8.5 mg/dL — ABNORMAL LOW (ref 8.9–10.3)
Chloride: 104 mmol/L (ref 101–111)
Creatinine, Ser: 0.67 mg/dL (ref 0.44–1.00)
Glucose, Bld: 109 mg/dL — ABNORMAL HIGH (ref 65–99)
POTASSIUM: 3.6 mmol/L (ref 3.5–5.1)
Sodium: 139 mmol/L (ref 135–145)
Total Protein: 5.6 g/dL — ABNORMAL LOW (ref 6.5–8.1)

## 2016-11-14 LAB — CBC
HEMATOCRIT: 27.1 % — AB (ref 36.0–46.0)
Hemoglobin: 9.5 g/dL — ABNORMAL LOW (ref 12.0–15.0)
MCH: 31.7 pg (ref 26.0–34.0)
MCHC: 35.1 g/dL (ref 30.0–36.0)
MCV: 90.3 fL (ref 78.0–100.0)
PLATELETS: 287 10*3/uL (ref 150–400)
RBC: 3 MIL/uL — ABNORMAL LOW (ref 3.87–5.11)
RDW: 13.2 % (ref 11.5–15.5)
WBC: 7.5 10*3/uL (ref 4.0–10.5)

## 2016-11-14 LAB — TSH: TSH: 2.539 u[IU]/mL (ref 0.350–4.500)

## 2016-11-14 LAB — SURGICAL PCR SCREEN
MRSA, PCR: NEGATIVE
Staphylococcus aureus: NEGATIVE

## 2016-11-14 LAB — VITAMIN B12: VITAMIN B 12: 2981 pg/mL — AB (ref 180–914)

## 2016-11-14 LAB — SEDIMENTATION RATE: Sed Rate: 18 mm/hr (ref 0–22)

## 2016-11-14 SURGERY — OPEN REDUCTION INTERNAL FIXATION (ORIF) DISTAL RADIUS FRACTURE
Anesthesia: Monitor Anesthesia Care | Site: Arm Lower | Laterality: Right

## 2016-11-14 MED ORDER — MEPERIDINE HCL 25 MG/ML IJ SOLN: 6.2500 mg | INTRAMUSCULAR | Status: DC | PRN

## 2016-11-14 MED ORDER — CHLORHEXIDINE GLUCONATE 4 % EX LIQD: 60.0000 mL | Freq: Once | CUTANEOUS | Status: DC

## 2016-11-14 MED ORDER — ONDANSETRON HCL 4 MG/2ML IJ SOLN
4.0000 mg | Freq: Four times a day (QID) | INTRAMUSCULAR | Status: DC | PRN
  Filled 2016-11-14: qty 2

## 2016-11-14 MED ORDER — PHENYLEPHRINE 40 MCG/ML (10ML) SYRINGE FOR IV PUSH (FOR BLOOD PRESSURE SUPPORT)
PREFILLED_SYRINGE | INTRAVENOUS | Status: AC
  Filled 2016-11-14: qty 10

## 2016-11-14 MED ORDER — ESCITALOPRAM OXALATE 20 MG PO TABS
20.0000 mg | ORAL_TABLET | Freq: Two times a day (BID) | ORAL | Status: DC
  Administered 2016-11-14 – 2016-11-15 (×3): 20 mg via ORAL
  Filled 2016-11-14: qty 1
  Filled 2016-11-14: qty 2
  Filled 2016-11-14 (×2): qty 1
  Filled 2016-11-14: qty 2

## 2016-11-14 MED ORDER — VITAMIN C 500 MG PO TABS
1000.0000 mg | ORAL_TABLET | Freq: Every day | ORAL | Status: DC
Start: 2016-11-14 — End: 2016-11-16
  Administered 2016-11-14 – 2016-11-15 (×2): 1000 mg via ORAL
  Filled 2016-11-14 (×2): qty 2

## 2016-11-14 MED ORDER — CARVEDILOL 3.125 MG PO TABS
3.1250 mg | ORAL_TABLET | Freq: Two times a day (BID) | ORAL | Status: DC
  Administered 2016-11-15 (×2): 3.125 mg via ORAL
  Filled 2016-11-14 (×2): qty 1

## 2016-11-14 MED ORDER — PHENYLEPHRINE 40 MCG/ML (10ML) SYRINGE FOR IV PUSH (FOR BLOOD PRESSURE SUPPORT)
PREFILLED_SYRINGE | INTRAVENOUS | Status: DC | PRN
  Administered 2016-11-14 (×4): 40 ug via INTRAVENOUS

## 2016-11-14 MED ORDER — HYDROCODONE-ACETAMINOPHEN 5-325 MG PO TABS: 1.0000 | ORAL_TABLET | ORAL | Status: DC | PRN

## 2016-11-14 MED ORDER — OXYCODONE-ACETAMINOPHEN 5-325 MG PO TABS
1.0000 | ORAL_TABLET | ORAL | Status: DC | PRN
  Administered 2016-11-15: 1 via ORAL
  Filled 2016-11-14: qty 1

## 2016-11-14 MED ORDER — MORPHINE SULFATE (PF) 2 MG/ML IV SOLN: 1.0000 mg | INTRAVENOUS | Status: DC | PRN

## 2016-11-14 MED ORDER — PROPOFOL 10 MG/ML IV BOLUS
INTRAVENOUS | Status: AC
  Filled 2016-11-14: qty 20

## 2016-11-14 MED ORDER — ADULT MULTIVITAMIN W/MINERALS CH: 1.0000 | ORAL_TABLET | Freq: Every day | ORAL | Status: DC

## 2016-11-14 MED ORDER — METHOCARBAMOL 1000 MG/10ML IJ SOLN
500.0000 mg | Freq: Four times a day (QID) | INTRAVENOUS | Status: DC | PRN
  Filled 2016-11-14: qty 5

## 2016-11-14 MED ORDER — CALCIUM CITRATE-VITAMIN D 315-200 MG-UNIT PO TABS: 1.0000 | ORAL_TABLET | Freq: Every day | ORAL | Status: DC

## 2016-11-14 MED ORDER — FENTANYL CITRATE (PF) 100 MCG/2ML IJ SOLN
INTRAMUSCULAR | Status: DC | PRN
  Administered 2016-11-14 (×2): 25 ug via INTRAVENOUS
  Administered 2016-11-14: 50 ug via INTRAVENOUS
  Administered 2016-11-14 (×2): 25 ug via INTRAVENOUS

## 2016-11-14 MED ORDER — ALPRAZOLAM 0.5 MG PO TABS
0.5000 mg | ORAL_TABLET | Freq: Three times a day (TID) | ORAL | Status: DC | PRN
  Administered 2016-11-14: 0.5 mg via ORAL
  Filled 2016-11-14: qty 1

## 2016-11-14 MED ORDER — ADULT MULTIVITAMIN W/MINERALS CH
1.0000 | ORAL_TABLET | Freq: Every day | ORAL | Status: DC
  Administered 2016-11-14 – 2016-11-15 (×2): 1 via ORAL
  Filled 2016-11-14: qty 1

## 2016-11-14 MED ORDER — PANTOPRAZOLE SODIUM 40 MG PO TBEC
40.0000 mg | DELAYED_RELEASE_TABLET | Freq: Every day | ORAL | Status: DC
  Administered 2016-11-15: 40 mg via ORAL
  Filled 2016-11-14: qty 1

## 2016-11-14 MED ORDER — METHOCARBAMOL 500 MG PO TABS: 500.0000 mg | ORAL_TABLET | Freq: Four times a day (QID) | ORAL | Status: DC | PRN

## 2016-11-14 MED ORDER — ONDANSETRON HCL 4 MG PO TABS: 4.0000 mg | ORAL_TABLET | Freq: Four times a day (QID) | ORAL | Status: DC | PRN

## 2016-11-14 MED ORDER — ONDANSETRON HCL 4 MG/2ML IJ SOLN: 4.0000 mg | Freq: Once | INTRAMUSCULAR | Status: DC | PRN

## 2016-11-14 MED ORDER — FENTANYL CITRATE (PF) 250 MCG/5ML IJ SOLN
INTRAMUSCULAR | Status: AC
  Filled 2016-11-14: qty 5

## 2016-11-14 MED ORDER — CALCIUM CARBONATE-VITAMIN D 500-200 MG-UNIT PO TABS
1.0000 | ORAL_TABLET | Freq: Every day | ORAL | Status: DC
  Administered 2016-11-15: 09:00:00 1 via ORAL
  Filled 2016-11-14: qty 1

## 2016-11-14 MED ORDER — VITAMIN B-12 1000 MCG PO TABS
1000.0000 ug | ORAL_TABLET | Freq: Every day | ORAL | Status: DC
  Administered 2016-11-14 – 2016-11-15 (×2): 1000 ug via ORAL
  Filled 2016-11-14 (×2): qty 1

## 2016-11-14 MED ORDER — FENTANYL CITRATE (PF) 100 MCG/2ML IJ SOLN: 100.0000 ug | Freq: Once | INTRAMUSCULAR | Status: DC

## 2016-11-14 MED ORDER — 0.9 % SODIUM CHLORIDE (POUR BTL) OPTIME
TOPICAL | Status: DC | PRN
  Administered 2016-11-14: 1000 mL

## 2016-11-14 MED ORDER — FENTANYL CITRATE (PF) 100 MCG/2ML IJ SOLN: 25.0000 ug | INTRAMUSCULAR | Status: DC | PRN

## 2016-11-14 MED ORDER — MIDAZOLAM HCL 2 MG/2ML IJ SOLN
INTRAMUSCULAR | Status: AC
  Administered 2016-11-14: 1 mg
  Filled 2016-11-14: qty 2

## 2016-11-14 MED ORDER — ONE-DAILY MULTI VITAMINS PO TABS: 1.0000 | ORAL_TABLET | Freq: Every day | ORAL | Status: DC

## 2016-11-14 MED ORDER — FENTANYL CITRATE (PF) 100 MCG/2ML IJ SOLN
INTRAMUSCULAR | Status: AC
  Administered 2016-11-14: 25 ug
  Filled 2016-11-14: qty 2

## 2016-11-14 MED ORDER — DOCUSATE CALCIUM 240 MG PO CAPS: 240.0000 mg | ORAL_CAPSULE | Freq: Every day | ORAL | Status: DC | PRN

## 2016-11-14 MED ORDER — BISACODYL 5 MG PO TBEC: 5.0000 mg | DELAYED_RELEASE_TABLET | Freq: Every day | ORAL | Status: DC | PRN

## 2016-11-14 MED ORDER — LORAZEPAM 2 MG/ML IJ SOLN
0.5000 mg | Freq: Once | INTRAMUSCULAR | Status: AC
Start: 2016-11-14 — End: 2016-11-14
  Administered 2016-11-14: 0.5 mg via INTRAVENOUS
  Filled 2016-11-14: qty 1

## 2016-11-14 MED ORDER — NITROGLYCERIN 0.4 MG SL SUBL: 0.4000 mg | SUBLINGUAL_TABLET | SUBLINGUAL | Status: DC | PRN

## 2016-11-14 MED ORDER — CEFAZOLIN SODIUM-DEXTROSE 1-4 GM/50ML-% IV SOLN
1.0000 g | Freq: Three times a day (TID) | INTRAVENOUS | Status: DC
  Administered 2016-11-15 (×3): 1 g via INTRAVENOUS
  Filled 2016-11-14 (×5): qty 50

## 2016-11-14 MED ORDER — LACTATED RINGERS IV SOLN
INTRAVENOUS | Status: DC | PRN
  Administered 2016-11-14: 14:00:00 via INTRAVENOUS

## 2016-11-14 MED ORDER — PROPOFOL 500 MG/50ML IV EMUL
INTRAVENOUS | Status: DC | PRN
  Administered 2016-11-14: 25 ug/kg/min via INTRAVENOUS

## 2016-11-14 MED ORDER — ASPIRIN EC 81 MG PO TBEC: 81.0000 mg | DELAYED_RELEASE_TABLET | Freq: Four times a day (QID) | ORAL | Status: DC | PRN

## 2016-11-14 MED ORDER — KCL IN DEXTROSE-NACL 20-5-0.45 MEQ/L-%-% IV SOLN
INTRAVENOUS | Status: DC
  Administered 2016-11-14: 21:00:00 via INTRAVENOUS
  Filled 2016-11-14: qty 1000

## 2016-11-14 MED ORDER — DIPHENHYDRAMINE HCL 25 MG PO CAPS: 25.0000 mg | ORAL_CAPSULE | Freq: Four times a day (QID) | ORAL | Status: DC | PRN

## 2016-11-14 MED ORDER — CEFAZOLIN SODIUM-DEXTROSE 1-4 GM/50ML-% IV SOLN: 1.0000 g | INTRAVENOUS | Status: DC

## 2016-11-14 MED ORDER — DIPHENOXYLATE-ATROPINE 2.5-0.025 MG PO TABS
1.0000 | ORAL_TABLET | Freq: Two times a day (BID) | ORAL | Status: DC
  Administered 2016-11-14 – 2016-11-15 (×3): 1 via ORAL
  Filled 2016-11-14 (×3): qty 1

## 2016-11-14 MED ORDER — BUPIVACAINE HCL (PF) 0.5 % IJ SOLN
INTRAMUSCULAR | Status: DC | PRN
  Administered 2016-11-14: 25 mL via PERINEURAL

## 2016-11-14 MED ORDER — CEFAZOLIN SODIUM-DEXTROSE 2-4 GM/100ML-% IV SOLN
2.0000 g | INTRAVENOUS | Status: AC
  Administered 2016-11-14: 2 g via INTRAVENOUS
  Filled 2016-11-14: qty 100

## 2016-11-14 MED ORDER — DOCUSATE SODIUM 100 MG PO CAPS: 200.0000 mg | ORAL_CAPSULE | Freq: Every day | ORAL | Status: DC | PRN

## 2016-11-14 MED ORDER — TRAMADOL HCL 50 MG PO TABS
50.0000 mg | ORAL_TABLET | Freq: Four times a day (QID) | ORAL | Status: DC | PRN
  Administered 2016-11-15: 50 mg via ORAL
  Filled 2016-11-14: qty 1

## 2016-11-14 MED ORDER — POLYETHYLENE GLYCOL 3350 17 G PO PACK: 17.0000 g | PACK | Freq: Every day | ORAL | Status: DC | PRN

## 2016-11-14 MED ORDER — MIDAZOLAM HCL 2 MG/2ML IJ SOLN
2.0000 mg | Freq: Once | INTRAMUSCULAR | Status: DC
Start: 2016-11-14 — End: 2016-11-16

## 2016-11-14 SURGICAL SUPPLY — 62 items
BANDAGE ACE 3X5.8 VEL STRL LF (GAUZE/BANDAGES/DRESSINGS) ×6 IMPLANT
BANDAGE ACE 4X5 VEL STRL LF (GAUZE/BANDAGES/DRESSINGS) ×3 IMPLANT
BIT DRILL 2.2 SS TIBIAL (BIT) ×3 IMPLANT
BLADE CLIPPER SURG (BLADE) IMPLANT
BNDG ESMARK 4X9 LF (GAUZE/BANDAGES/DRESSINGS) ×3 IMPLANT
BNDG GAUZE ELAST 4 BULKY (GAUZE/BANDAGES/DRESSINGS) ×3 IMPLANT
CANISTER SUCT 3000ML PPV (MISCELLANEOUS) ×3 IMPLANT
CORDS BIPOLAR (ELECTRODE) ×3 IMPLANT
COVER SURGICAL LIGHT HANDLE (MISCELLANEOUS) ×3 IMPLANT
CUFF TOURNIQUET SINGLE 18IN (TOURNIQUET CUFF) ×3 IMPLANT
DECANTER SPIKE VIAL GLASS SM (MISCELLANEOUS) ×3 IMPLANT
DRAPE OEC MINIVIEW 54X84 (DRAPES) ×3 IMPLANT
DRAPE SURG 17X11 SM STRL (DRAPES) ×3 IMPLANT
DRSG ADAPTIC 3X8 NADH LF (GAUZE/BANDAGES/DRESSINGS) ×3 IMPLANT
GAUZE SPONGE 4X4 12PLY STRL (GAUZE/BANDAGES/DRESSINGS) ×3 IMPLANT
GAUZE SPONGE 4X4 16PLY XRAY LF (GAUZE/BANDAGES/DRESSINGS) ×3 IMPLANT
GLOVE BIOGEL PI IND STRL 8.5 (GLOVE) ×1 IMPLANT
GLOVE BIOGEL PI INDICATOR 8.5 (GLOVE) ×2
GLOVE SURG ORTHO 8.0 STRL STRW (GLOVE) ×3 IMPLANT
GOWN STRL REUS W/ TWL LRG LVL3 (GOWN DISPOSABLE) ×1 IMPLANT
GOWN STRL REUS W/ TWL XL LVL3 (GOWN DISPOSABLE) ×1 IMPLANT
GOWN STRL REUS W/TWL LRG LVL3 (GOWN DISPOSABLE) ×2
GOWN STRL REUS W/TWL XL LVL3 (GOWN DISPOSABLE) ×2
K-WIRE 1.6 (WIRE) ×2
K-WIRE FX5X1.6XNS BN SS (WIRE) ×2
KIT BASIN OR (CUSTOM PROCEDURE TRAY) ×3 IMPLANT
KIT ROOM TURNOVER OR (KITS) ×3 IMPLANT
KIT SUCTION CATH 14FR (SUCTIONS) ×3 IMPLANT
KWIRE FX5X1.6XNS BN SS (WIRE) ×2 IMPLANT
NEEDLE HYPO 25X1 1.5 SAFETY (NEEDLE) ×3 IMPLANT
NS IRRIG 1000ML POUR BTL (IV SOLUTION) ×3 IMPLANT
PACK ORTHO EXTREMITY (CUSTOM PROCEDURE TRAY) ×3 IMPLANT
PAD ARMBOARD 7.5X6 YLW CONV (MISCELLANEOUS) ×6 IMPLANT
PAD CAST 4YDX4 CTTN HI CHSV (CAST SUPPLIES) ×1 IMPLANT
PADDING CAST COTTON 4X4 STRL (CAST SUPPLIES) ×2
PEG LOCKING SMOOTH 2.2X20 (Screw) ×6 IMPLANT
PLATE NARROW DVR RIGHT (Plate) ×3 IMPLANT
PUTTY DBM STAGRAFT PLUS 2CC (Putty) ×3 IMPLANT
SCREW LOCK 12X2.7X 3 LD (Screw) ×2 IMPLANT
SCREW LOCK 14X2.7X 3 LD TPR (Screw) ×4 IMPLANT
SCREW LOCK 20X2.7X 3 LD TPR (Screw) ×1 IMPLANT
SCREW LOCK 22X2.7X 3 LD TPR (Screw) ×2 IMPLANT
SCREW LOCKING 2.7X12MM (Screw) ×4 IMPLANT
SCREW LOCKING 2.7X14 (Screw) ×8 IMPLANT
SCREW LOCKING 2.7X15MM (Screw) ×3 IMPLANT
SCREW LOCKING 2.7X20MM (Screw) ×2 IMPLANT
SCREW LOCKING 2.7X22MM (Screw) ×4 IMPLANT
SCREW MULTI DIRECTIONAL 2.7X18 (Screw) ×3 IMPLANT
SOAP 2 % CHG 4 OZ (WOUND CARE) ×3 IMPLANT
SPLINT FIBERGLASS 3X35 (CAST SUPPLIES) ×3 IMPLANT
SPONGE LAP 4X18 X RAY DECT (DISPOSABLE) ×3 IMPLANT
SUT VIC AB 2-0 FS1 27 (SUTURE) IMPLANT
SUT VICRYL 4-0 PS2 18IN ABS (SUTURE) IMPLANT
SUT VICRYL RAPIDE 4/0 PS 2 (SUTURE) ×3 IMPLANT
SYR CONTROL 10ML LL (SYRINGE) IMPLANT
TOWEL OR 17X24 6PK STRL BLUE (TOWEL DISPOSABLE) ×3 IMPLANT
TOWEL OR 17X26 10 PK STRL BLUE (TOWEL DISPOSABLE) ×3 IMPLANT
TUBE CONNECTING 12'X1/4 (SUCTIONS) ×1
TUBE CONNECTING 12X1/4 (SUCTIONS) ×2 IMPLANT
WATER STERILE IRR 1000ML POUR (IV SOLUTION) ×3 IMPLANT
YANKAUER SUCT BULB TIP NO VENT (SUCTIONS) IMPLANT
k-wire 1.6 mm ×6 IMPLANT

## 2016-11-14 NOTE — Anesthesia Procedure Notes (Signed)
Anesthesia Regional Block: Axillary brachial plexus block   Pre-Anesthetic Checklist: ,, timeout performed, Correct Patient, Correct Site, Correct Laterality, Correct Procedure, Correct Position, site marked, Risks and benefits discussed, pre-op evaluation,  At surgeon's request and post-op pain management  Laterality: Right  Prep: chloraprep       Needles:   Needle Type: Echogenic Needle     Needle Length: 9cm  Needle Gauge: 21     Additional Needles:   Procedures: ultrasound guided,,,,,,,,  Narrative:  Start time: 11/14/2016 2:54 PM End time: 11/14/2016 3:04 PM Injection made incrementally with aspirations every 5 mL. Anesthesiologist: Lyndle Herrlich

## 2016-11-14 NOTE — Anesthesia Preprocedure Evaluation (Addendum)
Anesthesia Evaluation  Patient identified by MRN, date of birth, ID band Patient confused    Reviewed: Allergy & Precautions, H&P , Patient's Chart, lab work & pertinent test results, reviewed documented beta blocker date and time   Airway Mallampati: II  TM Distance: >3 FB Neck ROM: full  Mouth opening: Limited Mouth Opening  Dental no notable dental hx. (+) Dental Advisory Given   Pulmonary    Pulmonary exam normal breath sounds clear to auscultation       Cardiovascular Exercise Tolerance: Good hypertension, + CAD   Rhythm:regular Rate:Normal     Neuro/Psych    GI/Hepatic   Endo/Other    Renal/GU      Musculoskeletal  (+) Arthritis ,   Abdominal   Peds  Hematology   Anesthesia Other Findings Takotsubo syndrome      Coronary artery disease   Blood transfusion   Hypertension         The estimated ejection fraction was in the range of 60% to 65%.   Wall motion was normal; there were no regional wall motion   abnormalities.    - Pulmonary arteries: PA peak pressure: 25 mm Hg (S).  Reproductive/Obstetrics                         Anesthesia Physical Anesthesia Plan  ASA: III  Anesthesia Plan: MAC   Post-op Pain Management:  Regional for Post-op pain   Induction: Intravenous  Airway Management Planned: Natural Airway and Simple Face Mask  Additional Equipment:   Intra-op Plan:   Post-operative Plan: Extubation in OR  Informed Consent: I have reviewed the patients History and Physical, chart, labs and discussed the procedure including the risks, benefits and alternatives for the proposed anesthesia with the patient or authorized representative who has indicated his/her understanding and acceptance.   Dental Advisory Given  Plan Discussed with: CRNA and Surgeon  Anesthesia Plan Comments: (  )     Anesthesia Quick Evaluation

## 2016-11-14 NOTE — Transfer of Care (Signed)
Immediate Anesthesia Transfer of Care Note  Patient: [name]  Procedure(s) Performed: Procedure(s): OPEN REDUCTION INTERNAL FIXATION (ORIF) DISTAL RADIAL FRACTURE, with repair as indicated (Right)  Patient Location: PACU  Anesthesia Type:MAC combined with regional for post-op pain  Level of Consciousness: awake and patient cooperative  Airway & Oxygen Therapy: Patient Spontanous Breathing and Patient connected to face mask oxygen  Post-op Assessment: Report given to RN and Post -op Vital signs reviewed and stable  Post vital signs: Reviewed and stable  Last Vitals:  Vitals:   11/14/16 1351  BP: (!) 156/82  Pulse: 93  Temp: 37.2 C    Last Pain:  Vitals:   11/14/16 1351  TempSrc: Oral         Complications: No apparent anesthesia complications

## 2016-11-14 NOTE — Progress Notes (Signed)
LOCATION: [facility]  PCP: Woody Seller, MD   Code Status: DNR  Goals of care: Advanced Directive information Advanced Directives 11/14/2016  Does Patient Have a Medical Advance Directive? Yes  Type of Advance Directive Out of facility DNR (pink MOST or yellow form)  Does patient want to make changes to medical advance directive? No - Patient declined  Copy of [place] in Chart? -  Would patient like information on creating a medical advance directive? -  Pre-existing out of facility DNR order (yellow form or pink MOST form) -       Extended Emergency Contact Information Primary Emergency Contact: Taylor,Sheila Address: [address], [place] [address] [place] Phone: [phone] Relation: Daughter Secondary Emergency Contact: Gavin Pound [place] Mobile Phone: [phone] Relation: Grandson   Allergies  Allergen Reactions  . Adhesive [Tape] Other (See Comments)    Sensitive to adhesives, must be removed with adhesive remover due to thin skin.  Octaviano Glow Hives    Chief Complaint  Patient presents with  . New Admit To SNF    New Admission Visit      HPI:  Patient is a 82 y.o. female seen today for short term rehabilitation post hospital admission from 11/09/16-11/11/16 post fall with displaced right distal radius fracture and hypoxia. This was reduced by orthopedic in the ER. CT head, CXR and CTA chest were negative for acute abnormalities. She received hydration. She has PMH of CAD, HTN, dementia, anxiety, HLD, IBS and Takotsubo syndrome. She is seen in her room today. She tends to close her eyes in between conversation and needs to be woken up and provides 1-2 word answers.   Review of Systems: Limited HPI/ROS Constitutional: Negative for fever, chills HENT: Negative for headache Respiratory: Negative for cough, shortness of breath Cardiovascular: Negative  for chest pain Musculoskeletal: Positive for pain to right arm and discomfort to her back.    Past Medical History:  Diagnosis Date  . Anemia   . Anxiety    confusion  . Blood transfusion    2005 maybe  . Coronary artery disease   . Depression   . Diverticular disease   . Esophageal reflux   . Falls frequently   . Headache(784.0)    takes Prilosec  . Hypertension   . IBS (irritable bowel syndrome)   . Osteoarthrosis, unspecified whether generalized or localized, lower leg   . Other and unspecified hyperlipidemia   . Shortness of breath    when my heart is not doing right  . Takotsubo syndrome   . Wrist fracture    Past Surgical History:  Procedure Laterality Date  . BACK SURGERY    . BREAST RECONSTRUCTION    . CATARACT EXTRACTION W/ INTRAOCULAR LENS  IMPLANT, BILATERAL    . FRACTURE SURGERY    . HIP ARTHROPLASTY Right 06/10/2013   Procedure: ARTHROPLASTY MONOPOLAR HIP CEMENTED;  Surgeon: Marybelle Killings, MD;  Location: [facility];  Service: Orthopedics;  Laterality: Right;  . HYSTEROTOMY    . IMPLANTABLE CONTACT LENS IMPLANTATION    . JOINT REPLACEMENT    . KNEE SURGERY    . KYPHOPLASTY N/A 11/05/2013   Procedure: Jone Baseman TWO;  Surgeon: Ophelia Charter, MD;  Location: [facility];  Service: Neurosurgery;  Laterality: N/A;  . TONSILLECTOMY     as child   Social History:   reports that she has never  smoked. She has never used smokeless tobacco. She reports that she does not drink alcohol or use drugs.  Family History  Problem Relation Age of Onset  . Heart failure Mother   . Stroke Sister   . Lung cancer Brother   . Lung cancer Other     Medications: Allergies as of 11/14/2016      Reactions   Adhesive [tape] Other (See Comments)   Sensitive to adhesives, must be removed with adhesive remover due to thin skin.   Sulfamethoxazole-trimethoprim Hives      Medication List       Accurate as of 11/14/16 11:33 AM. Always use your most recent med list.            ALPRAZolam 0.5 MG tablet Commonly known as:  XANAX Take 1 tablet (0.5 mg total) by mouth 3 (three) times daily as needed for anxiety.   aspirin EC 81 MG tablet Take 81 mg by mouth every 6 (six) hours as needed for moderate pain.   CALCIUM + D PO Take 1 tablet by mouth daily.   carvedilol 3.125 MG tablet Commonly known as:  COREG TAKE 1 TABLET BY MOUTH TWICE A DAY WITH A MEAL   CRANBERRY PO Take 2 capsules by mouth daily.   diphenoxylate-atropine 2.5-0.025 MG tablet Commonly known as:  LOMOTIL Take 1 tablet by mouth 2 (two) times daily.   escitalopram 20 MG tablet Commonly known as:  LEXAPRO Take 20 mg by mouth 2 (two) times daily.   HYDROcodone-acetaminophen 5-325 MG tablet Commonly known as:  NORCO Take 1 tablet by mouth every 6 (six) hours as needed for moderate pain.   multivitamin tablet Take 1 tablet by mouth daily.   nitroGLYCERIN 0.4 MG SL tablet Commonly known as:  NITROSTAT Place 1 tablet (0.4 mg total) under the tongue every 5 (five) minutes as needed. For chest pain.   omeprazole 20 MG capsule Commonly known as:  PRILOSEC Take 20 mg by mouth daily.   oxyCODONE 5 MG immediate release tablet Commonly known as:  Oxy IR/ROXICODONE Take 1 tablet (5 mg total) by mouth every 4 (four) hours as needed for moderate pain.   polyethylene glycol packet Commonly known as:  MIRALAX / GLYCOLAX Take 17 g by mouth daily as needed for mild constipation.   PROBIOTIC DAILY PO Take 1 tablet by mouth daily.   STOOL SOFTENER PO Take 1 tablet by mouth daily as needed (constipation).   traMADol 50 MG tablet Commonly known as:  ULTRAM Take 1 tablet (50 mg total) by mouth 3 (three) times daily as needed.   trolamine salicylate 10 % cream Commonly known as:  ASPERCREME Apply 1 application topically as needed (for back pain).   UNABLE TO FIND Med Name: Med pass 120 mL by mouth 3 times daily   vitamin B-12 1000 MCG tablet Commonly known as:   CYANOCOBALAMIN Take 1,000 mcg by mouth daily.       Immunizations: Immunization History  Administered Date(s) Administered  . PPD Test 11/11/2016  . Pneumococcal Polysaccharide-23 11/22/2014     Physical Exam: Vitals:   11/14/16 1122  BP: 126/81  Pulse: 88  Resp: 18  Temp: 98.1 F (36.7 C)  TempSrc: Oral  SpO2: 96%  Weight: 113 lb 11.2 oz (51.6 kg)  Height: 5\' 3"  (1.6 m)   Body mass index is 20.14 kg/m.  General- elderly female, frail and thin built, in no acute distress Head- normocephalic, atraumatic Nose- no nasal discharge Throat- moist mucus membrane, has  dentures Eyes- PERRLA, EOMI, no pallor, no icterus, no discharge, normal conjunctiva, normal sclera Neck- no cervical lymphadenopathy Cardiovascular- normal s1,s2, no murmur Respiratory- bilateral clear to auscultation, no wheeze, no rhonchi, no crackles, no use of accessory muscles Abdomen- bowel sounds present, soft, non tender, no guarding or rigidity Musculoskeletal- splint to right arm, can move her fingers, able to move other extremities, trace leg edema Neurological- alert and oriented to self and place only Skin- warm and dry, bruise to right arm and leg noted Psychiatry- normal mood and affect    Labs reviewed: Basic Metabolic Panel:  Recent Labs  11/09/16 1018 11/09/16 2218 11/10/16 0524  NA 136  --  135  K 3.3*  --  3.6  CL 101  --  103  CO2 28  --  23  GLUCOSE 143*  --  139*  BUN 13  --  11  CREATININE 0.85  --  0.66  CALCIUM 8.7*  --  8.5*  MG  --  1.6*  --    Liver Function Tests: No results for input(s): AST, ALT, ALKPHOS, BILITOT, PROT, ALBUMIN in the last 8760 hours. No results for input(s): LIPASE, AMYLASE in the last 8760 hours. No results for input(s): AMMONIA in the last 8760 hours. CBC:  Recent Labs  11/09/16 1018 11/10/16 0524 11/11/16 0819  WBC 12.2* 10.0 7.8  HGB 11.3* 10.2* 8.6*  HCT 33.7* 29.8* 25.3*  MCV 91.3 90.6 91.7  PLT 245 215 183   Cardiac  Enzymes:  Recent Labs  11/09/16 2218 11/10/16 0524 11/10/16 1044  TROPONINI <0.03 0.03* 0.03*   BNP: Invalid input(s): POCBNP CBG:  Recent Labs  11/10/16 0616 11/10/16 0737 11/11/16 0752  GLUCAP 124* 122* 100*    Radiological Exams: Dg Ribs Unilateral W/chest Right  Result Date: 11/09/2016 CLINICAL DATA:  Fall at home this morning. Right-sided rib and chest pain. Initial encounter. EXAM: RIGHT RIBS AND CHEST - 3+ VIEW COMPARISON:  Chest radiograph on 11/21/2014 FINDINGS: No fracture or other bone lesions are seen involving the ribs. There is no evidence of pneumothorax or pleural effusion. Both lungs are clear. Heart size is normal. Stable ectasia of thoracic aorta. Bilateral breast implants noted, as well as lumbar spine fusion hardware and previous upper lumbar vertebroplasty. IMPRESSION: No acute findings. Electronically Signed   By: Earle Gell M.D.   On: 11/09/2016 10:55   Dg Wrist Complete Right  Result Date: 11/09/2016 CLINICAL DATA:  Closed fracture distal radius postreduction EXAM: RIGHT WRIST - COMPLETE 3+ VIEW COMPARISON:  11/09/2016 FINDINGS: Three views of the right wrist submitted. Limited study by casting material artifact. Persistent comminuted fracture in distal right radius with dorsal angulation. No significant change in alignment from prior exam. IMPRESSION: Persistent comminuted fracture in distal right radius with dorsal angulation. No significant change in alignment from prior exam. Electronically Signed   By: Lahoma Crocker M.D.   On: 11/09/2016 17:01   Dg Wrist Complete Right  Result Date: 11/09/2016 CLINICAL DATA:  Postreduction films. EXAM: RIGHT WRIST - COMPLETE 3+ VIEW COMPARISON:  Earlier the same day FINDINGS: Fine bony detail obscured by the overlying plaster. Fracture the distal radial metaphysis again noted. Bony alignment similar to the pre reduction films. Bones appear demineralized. IMPRESSION: No substantial interval change status post closed  reduction. Electronically Signed   By: Misty Stanley M.D.   On: 11/09/2016 14:35   Dg Wrist Complete Right  Result Date: 11/09/2016 CLINICAL DATA:  Fall at home this a.m landing onto rt side complains  of rt sided chest pain, rt wrist pain with deformity EXAM: RIGHT WRIST - COMPLETE 3+ VIEW COMPARISON:  None. FINDINGS: Fracture of the distal radial metaphysis with dorsal angulation. Fracture does not appear to enter the articular surface. Radiocarpal joint is intact. Mild comminution of the fracture. Fracture of the ulnar styloid age-indeterminate. IMPRESSION: 1. Comminuted fracture of the distal radius with dorsal angulation. 2. Radiocarpal joint intact. 3. Age-indeterminate ulnar styloid fracture. Electronically Signed   By: Suzy Bouchard M.D.   On: 11/09/2016 10:55   Ct Head Wo Contrast  Result Date: 11/09/2016 CLINICAL DATA:  Patient presents to the emergency department after tripping over her cat food bowl this morning and landing on her right wrist and right lateral chest. She presents with obvious deformity of her right wrist. She denies head injury or headache at this time. She denies neck pain. She denies recent illness. She is alert and oriented 4. She presents via EMS. She denies hip pain. Her only complaint is focused right wrist pain. She denies neck pain EXAM: CT HEAD WITHOUT CONTRAST CT CERVICAL SPINE WITHOUT CONTRAST TECHNIQUE: Multidetector CT imaging of the head and cervical spine was performed following the standard protocol without intravenous contrast. Multiplanar CT image reconstructions of the cervical spine were also generated. COMPARISON:  11/20/2014 FINDINGS: CT HEAD FINDINGS Brain: No evidence of acute infarction, hemorrhage, hydrocephalus, extra-axial collection or mass lesion/mass effect. Vascular: No hyperdense vessel or unexpected calcification. Skull: Normal. Negative for fracture or focal lesion. Sinuses/Orbits: No acute finding. Other: None. CT CERVICAL SPINE FINDINGS  Alignment: Hyperextended but otherwise unremarkable with no spondylolisthesis. Skull base and vertebrae: No acute fracture. No primary bone lesion or focal pathologic process. Soft tissues and spinal canal: No prevertebral fluid or swelling. No visible canal hematoma. Disc levels: Mild to moderate loss disc height at C3-C4. Moderate loss disc height at C5-C6 and C6-C7. Endplate spurring at these levels. Bilateral facet degenerative change. Bones are demineralized. There are mild areas of disc bulging, but no convincing disc herniation. Upper chest: Apical pleuroparenchymal scarring.  No acute findings. Other: None. IMPRESSION: HEAD CT:  No acute intracranial abnormalities.  No skull fracture. CERVICAL CT:  No fracture or acute finding. Electronically Signed   By: Lajean Manes M.D.   On: 11/09/2016 17:54   Ct Cervical Spine Wo Contrast  Result Date: 11/09/2016 CLINICAL DATA:  Patient presents to the emergency department after tripping over her cat food bowl this morning and landing on her right wrist and right lateral chest. She presents with obvious deformity of her right wrist. She denies head injury or headache at this time. She denies neck pain. She denies recent illness. She is alert and oriented 4. She presents via EMS. She denies hip pain. Her only complaint is focused right wrist pain. She denies neck pain EXAM: CT HEAD WITHOUT CONTRAST CT CERVICAL SPINE WITHOUT CONTRAST TECHNIQUE: Multidetector CT imaging of the head and cervical spine was performed following the standard protocol without intravenous contrast. Multiplanar CT image reconstructions of the cervical spine were also generated. COMPARISON:  11/20/2014 FINDINGS: CT HEAD FINDINGS Brain: No evidence of acute infarction, hemorrhage, hydrocephalus, extra-axial collection or mass lesion/mass effect. Vascular: No hyperdense vessel or unexpected calcification. Skull: Normal. Negative for fracture or focal lesion. Sinuses/Orbits: No acute finding.  Other: None. CT CERVICAL SPINE FINDINGS Alignment: Hyperextended but otherwise unremarkable with no spondylolisthesis. Skull base and vertebrae: No acute fracture. No primary bone lesion or focal pathologic process. Soft tissues and spinal canal: No prevertebral fluid or swelling.  No visible canal hematoma. Disc levels: Mild to moderate loss disc height at C3-C4. Moderate loss disc height at C5-C6 and C6-C7. Endplate spurring at these levels. Bilateral facet degenerative change. Bones are demineralized. There are mild areas of disc bulging, but no convincing disc herniation. Upper chest: Apical pleuroparenchymal scarring.  No acute findings. Other: None. IMPRESSION: HEAD CT:  No acute intracranial abnormalities.  No skull fracture. CERVICAL CT:  No fracture or acute finding. Electronically Signed   By: Lajean Manes M.D.   On: 11/09/2016 17:54   Ct Angio Chest/abd/pel For Dissection W And/or Wo Contrast  Result Date: 11/09/2016 CLINICAL DATA:  Syncope with hypertension and hypoxia. Evaluate for aortic dissection. EXAM: CT ANGIOGRAPHY CHEST, ABDOMEN AND PELVIS TECHNIQUE: Multidetector CT imaging through the chest, abdomen and pelvis was performed using the standard protocol during bolus administration of intravenous contrast. Multiplanar reconstructed images and MIPs were obtained and reviewed to evaluate the vascular anatomy. CONTRAST:  100 CC Isovue 370 COMPARISON:  None. FINDINGS: CTA CHEST FINDINGS Study is markedly motion degraded. Cardiovascular: Precontrast imaging shows no hyperdense crescent in the wall of the thoracic aorta to suggest acute intramural hematoma. Ascending thoracic aorta measures approximately 3.3 cm diameter. Within the limitation of 9 gated technique and substantial patient motion, no definite dissection flap is identified in the thoracic aorta. Heart size is normal. No pericardial effusion. No large central pulmonary embolus in the main pulmonary outflow tract or either main pulmonary  artery. Mediastinum/Nodes: No mediastinal lymphadenopathy. There is no hilar lymphadenopathy. There is no axillary lymphadenopathy. Lungs/Pleura: No focal airspace consolidation. No overt pulmonary edema. No substantial pleural effusion. Given the extreme motion artifact, small pulmonary nodules could be obscured. Musculoskeletal: No gross lytic or sclerotic osseous abnormality is identified. Assessment of bony anatomy for fracture is markedly limited by the motion artifact. Review of the MIP images confirms the above findings. CTA ABDOMEN AND PELVIS FINDINGS VASCULAR Aorta: No thoracic aortic aneurysm. Celiac: Patent. Assessment for stenosis not possible due to motion artifact. SMA: Patent. Assessment for proximal stenosis not possible due to motion artifact. Renals: Patent bilaterally. Accessory left renal artery noted. Fine detail obscured by motion artifact. IMA: Not visualized but there is substantial motion artifact in this region. Veins: Portal vein and superior mesenteric vein are patent. IVC appears patent although assessment is degraded by streak artifact from spinal fixation hardware and motion artifact. Review of the MIP images confirms the above findings. NON-VASCULAR Hepatobiliary: No gross abnormality in the liver parenchyma. There is no evidence for gallstones, gallbladder wall thickening, or pericholecystic fluid. No intrahepatic or extrahepatic biliary dilation. Pancreas: Limited assessment due to motion without focal abnormality identified. No dilatation of the main pancreatic duct. Spleen: Unremarkable. Adrenals/Urinary Tract: No adrenal nodule or mass. No hydronephrosis or gross renal mass evident. No evidence for hydroureter. Bladder is obscured by streak artifact from right hip replacement. Stomach/Bowel: Stomach is nondistended. Duodenum not well evaluated. No evidence for small bowel obstruction. No evidence for colonic obstruction. Bowel extends into a right groin hernia but cannot be  clearly discerned given streak artifact from the hip replacement and patient motion. Lymphatic: No gross lymphadenopathy can be identified in the abdomen or pelvis although right pelvic sidewall not well evaluated. Reproductive: Obscured. Other: No substantial intraperitoneal free fluid. Musculoskeletal: Diffuse demineralization. Right hip replacement. Old inferior right pubic ramus fracture. Status post lower lumbar fusion. Multilevel thoracolumbar compression fractures. Review of the MIP images confirms the above findings. IMPRESSION: 1. Study is markedly degraded by patient motion throughout image  acquisition imaging through the pelvis further degraded by lumbar fusion hardware and right hip replacement. 2. Within the above-stated limitation, no definite dissection is identified in the thoraco abdominal aorta. There is no thoracoabdominal aortic aneurysm. 3. Right groin hernia contains bowel but is largely obscured by streak artifact and motion artifact. No findings to suggest bowel obstruction at this time. 4. Diffuse bony demineralization with multiple thoracolumbar compression fractures. These are age-indeterminate and cannot be further evaluated given the marked motion degradation. Electronically Signed   By: Misty Stanley M.D.   On: 11/09/2016 21:25    Assessment/Plan  unsteady gait Will have patient work with PT/OT as tolerated to regain strength and restore function.  Fall precautions are in place given her frequent falls. She is a high fall risk.  Closed fracture of distal radius right Post fall, underwent closed reduction in ED and needs to follow with orthopedic for ORIF. Will have patient work with PT/OT as tolerated to regain strength and restore function.  Fall precautions are in place. NWB to RUE for now. Make orthopedic follow up. Currently on oxyIR 5 mg q4h prn pain, tramadol 50 mg tid prn and aspirin ec 81 mg q6h prn pain. D/c aspirin with risk for bleed. Also on norco 5-325 mg q6h prn  pain, discontinue this to avoid overdose of narcotics. PMR consult.  Protein calorie malnutrition Monitor po intake, RD and SLP consult, assistance with feed. Continue medpass.   Dementia Provide supportive care. Fall precautions, aspiration precaution and pressure ulcer prophylaxis  Anemia unspecified Check cbc  Chronic diastolic CHF continue carvedilol 3.125 mg bid.   CAD On carvedilol. D/c prn aspirin order and place her on aspirin ec 81 mg daily for now.   Chronic depression Continue lexapro 20 mg bid and monitor for now  Constipation On miralax on need basis  GERD Continue omeprazole, no changes made  GAD On alprazolam 0.5 mg tid prn, get psych consult    Goals of care: short term rehabilitation   Labs/tests ordered: cbc, cmp 11/15/16  Family/ staff Communication: reviewed care plan with patient and nursing supervisor    Blanchie Serve, MD Internal Medicine [place], [place] [address] Cell Phone (Monday-Friday 8 am - 5 pm): [phone] On Call: [phone] and follow prompts after 5 pm and on weekends Office Phone: [phone] Office Fax: [phone]

## 2016-11-14 NOTE — Consult Note (Signed)
[facility] H&P   Patient Demographics:    [name], is a 82 y.o. female  MRN: [id number]   DOB - 07-13-1935  Admit Date - 11/14/2016  Outpatient Primary MD for the patient is Woody Seller, MD  Referring MD/NP/PA: Gavin Pound Outpatient Specialists:     Patient coming from: PACU  No chief complaint on file.  AMS   HPI:    [name]  is a 82 y.o. female, w Takotsubo syndrome, CAD, osteoarthritis, osteoporosis, s/p  Open treatment of right wrist intra-articular distal radius fracture 3 or more fragment apparently has been confused since her fall.  There may be some baseline memory issues per her daughter.  Pt does not have any focal deficits.  Pt is axox3 (person, place and time), seems somewhat confused in general.  Speech is fluent and the patient is very pleasant.  Daughter notes that pain medication can sometimes confuse her.     Review of systems:    In addition to the HPI above,  No Fever-chills, No Headache, No changes with Vision or hearing, No problems swallowing food or Liquids, No Chest pain, Cough or Shortness of Breath, No Abdominal pain, No Nausea or Vommitting, Bowel movements are regular, No Blood in stool or Urine, No dysuria, No new skin rashes or bruises,  No new weakness, tingling, numbness in any extremity, No recent weight gain or loss, No polyuria, polydypsia or polyphagia, No significant Mental Stressors.  A full 10 point Review of Systems was done, except as stated above, all other Review of Systems were negative.   With Past History of the following :    Past Medical History:  Diagnosis Date  . Anemia   . Anxiety    confusion  . Blood transfusion    2005 maybe  . Coronary artery disease   . Depression   . Diverticular disease   . Esophageal reflux   . Falls frequently   . Headache(784.0)    takes Prilosec  . Hypertension   .  IBS (irritable bowel syndrome)   . Osteoarthrosis, unspecified whether generalized or localized, lower leg   . Other and unspecified hyperlipidemia   . Shortness of breath    when my heart is not doing right  . Takotsubo syndrome   . Wrist fracture       Past Surgical History:  Procedure Laterality Date  . BACK SURGERY    . BREAST RECONSTRUCTION    . CATARACT EXTRACTION W/ INTRAOCULAR LENS  IMPLANT, BILATERAL    . FRACTURE SURGERY    . HIP ARTHROPLASTY Right 06/10/2013   Procedure: ARTHROPLASTY MONOPOLAR HIP CEMENTED;  Surgeon: Marybelle Killings, MD;  Location: [facility];  Service: Orthopedics;  Laterality: Right;  . HYSTEROTOMY    . IMPLANTABLE CONTACT LENS IMPLANTATION    . JOINT REPLACEMENT    . KNEE SURGERY    . KYPHOPLASTY N/A 11/05/2013  Procedure: [facility] TWO;  Surgeon: Ophelia Charter, MD;  Location: [place] [facility];  Service: Neurosurgery;  Laterality: N/A;  . TONSILLECTOMY     as child      Social History:     Social History  Substance Use Topics  . Smoking status: Never Smoker  . Smokeless tobacco: Never Used  . Alcohol use No     Lives -   Mobility - unclear      Family History :     Family History  Problem Relation Age of Onset  . Heart failure Mother   . Stroke Sister   . Lung cancer Brother   . Lung cancer Other       Home Medications:   Prior to Admission medications   Medication Sig Start Date End Date Taking? Authorizing Provider  ALPRAZolam Duanne Moron) 0.5 MG tablet Take 1 tablet (0.5 mg total) by mouth 3 (three) times daily as needed for anxiety. 11/10/16  Yes Annita Brod, MD  aspirin EC 81 MG tablet Take 81 mg by mouth every 6 (six) hours as needed for moderate pain.    Yes Historical Provider, MD  Calcium Citrate-Vitamin D (CALCIUM + D PO) Take 1 tablet by mouth daily.   Yes Historical Provider, MD  carvedilol (COREG) 3.125 MG tablet TAKE 1 TABLET BY MOUTH TWICE A DAY WITH A MEAL 05/15/16  Yes Sherren Mocha, MD  CRANBERRY PO  Take 2 capsules by mouth daily.   Yes Historical Provider, MD  diphenoxylate-atropine (LOMOTIL) 2.5-0.025 MG per tablet Take 1 tablet by mouth 2 (two) times daily.  04/27/14  Yes Historical Provider, MD  Docusate Calcium (STOOL SOFTENER PO) Take 1 tablet by mouth daily as needed (constipation).    Yes Historical Provider, MD  escitalopram (LEXAPRO) 20 MG tablet Take 20 mg by mouth 2 (two) times daily.   Yes Historical Provider, MD  nitroGLYCERIN (NITROSTAT) 0.4 MG SL tablet Place 1 tablet (0.4 mg total) under the tongue every 5 (five) minutes as needed. For chest pain. 04/22/13  Yes Sherren Mocha, MD  omeprazole (PRILOSEC) 20 MG capsule Take 20 mg by mouth daily.     Yes Historical Provider, MD  polyethylene glycol (MIRALAX / GLYCOLAX) packet Take 17 g by mouth daily as needed for mild constipation. 11/10/16  Yes Annita Brod, MD  Probiotic Product (PROBIOTIC DAILY PO) Take 1 tablet by mouth daily.   Yes Historical Provider, MD  traMADol (ULTRAM) 50 MG tablet Take 1 tablet (50 mg total) by mouth 3 (three) times daily as needed. 11/10/16  Yes Annita Brod, MD  trolamine salicylate (ASPERCREME) 10 % cream Apply 1 application topically as needed (for back pain).    Yes Historical Provider, MD  UNABLE TO FIND Med Name: Med pass 120 mL by mouth 3 times daily   Yes Historical Provider, MD  vitamin B-12 (CYANOCOBALAMIN) 1000 MCG tablet Take 1,000 mcg by mouth daily.   Yes Historical Provider, MD  HYDROcodone-acetaminophen (NORCO) 5-325 MG tablet Take 1 tablet by mouth every 6 (six) hours as needed for moderate pain. 11/09/16   Jola Schmidt, MD  Multiple Vitamin (MULTIVITAMIN) tablet Take 1 tablet by mouth daily.    Historical Provider, MD  oxyCODONE (OXY IR/ROXICODONE) 5 MG immediate release tablet Take 1 tablet (5 mg total) by mouth every 4 (four) hours as needed for moderate pain. 11/10/16   Annita Brod, MD     Allergies:     Allergies  Allergen Reactions  . Adhesive [Tape] Other (  See  Comments)    Sensitive to adhesives, must be removed with adhesive remover due to thin skin.  Octaviano Glow Hives     Physical Exam:   Vitals  Blood pressure (!) 139/52, pulse 73, temperature 98.8 F (37.1 C), resp. rate 19, weight 51.3 kg (113 lb), SpO2 100 %.   1. General lying in bed in NAD,    2. Normal affect and insight, Not Suicidal or Homicidal, Awake Alert, Oriented X 3. initially confused about place  3. No F.N deficits, ALL C.Nerves Intact, Strength 5/5 all 4 extremities, Sensation intact all 4 extremities, Plantars down going.  4. Ears and Eyes appear Normal, Conjunctivae clear, PERRLA. Moist Oral Mucosa.  5. Supple Neck, No JVD, No cervical lymphadenopathy appriciated, No Carotid Bruits.  6. Symmetrical Chest wall movement, Good air movement bilaterally, CTAB.  7. RRR, No Gallops, Rubs or Murmurs, No Parasternal Heave.  8. Positive Bowel Sounds, Abdomen Soft, No tenderness, No organomegaly appriciated,No rebound -guarding or rigidity.  9.  No Cyanosis, Normal Skin Turgor, No Skin Rash or Bruise.  10. Good muscle tone,  joints appear normal , no effusions, Normal ROM.  11. No Palpable Lymph Nodes in Neck or Axillae  Right forearm wrapped     Data Review:    CBC  Recent Labs Lab 11/09/16 1018 11/10/16 0524 11/11/16 0819 11/14/16 1401  WBC 12.2* 10.0 7.8 7.5  HGB 11.3* 10.2* 8.6* 9.5*  HCT 33.7* 29.8* 25.3* 27.1*  PLT 245 215 183 287  MCV 91.3 90.6 91.7 90.3  MCH 30.6 31.0 31.2 31.7  MCHC 33.5 34.2 34.0 35.1  RDW 12.3 12.6 12.7 13.2   ------------------------------------------------------------------------------------------------------------------  Chemistries   Recent Labs Lab 11/09/16 1018 11/09/16 2218 11/10/16 0524 11/14/16 1401  NA 136  --  135 138  K 3.3*  --  3.6 3.1*  CL 101  --  103 105  CO2 28  --  23 23  GLUCOSE 143*  --  139* 119*  BUN 13  --  11 14  CREATININE 0.85  --  0.66 0.72  CALCIUM 8.7*  --   8.5* 8.8*  MG  --  1.6*  --   --    ------------------------------------------------------------------------------------------------------------------ estimated creatinine clearance is 46.2 mL/min (by C-G formula based on SCr of 0.72 mg/dL). ------------------------------------------------------------------------------------------------------------------ No results for input(s): TSH, T4TOTAL, T3FREE, THYROIDAB in the last 72 hours.  Invalid input(s): FREET3  Coagulation profile No results for input(s): INR, PROTIME in the last 168 hours. ------------------------------------------------------------------------------------------------------------------- No results for input(s): DDIMER in the last 72 hours. -------------------------------------------------------------------------------------------------------------------  Cardiac Enzymes  Recent Labs Lab 11/09/16 2218 11/10/16 0524 11/10/16 1044  TROPONINI <0.03 0.03* 0.03*   ------------------------------------------------------------------------------------------------------------------    Component Value Date/Time   BNP 224.0 (H) 11/10/2016 1747     ---------------------------------------------------------------------------------------------------------------  Urinalysis    Component Value Date/Time   COLORURINE AMBER (A) 11/14/2016 1732   APPEARANCEUR CLEAR 11/14/2016 1732   LABSPEC 1.028 11/14/2016 1732   PHURINE 5.0 11/14/2016 1732   GLUCOSEU NEGATIVE 11/14/2016 1732   HGBUR NEGATIVE 11/14/2016 1732   BILIRUBINUR NEGATIVE 11/14/2016 1732   KETONESUR 20 (A) 11/14/2016 1732   PROTEINUR 30 (A) 11/14/2016 1732   UROBILINOGEN 0.2 08/12/2014 1901   NITRITE NEGATIVE 11/14/2016 1732   LEUKOCYTESUR NEGATIVE 11/14/2016 1732    ----------------------------------------------------------------------------------------------------------------   Imaging Results:    No results found.     Assessment & Plan:    Active  Problems:   Closed fracture of right distal radius    1. AMS  ?  Secondary to pain medication w slight baseline cognitive impairment ua negative Check CT brain Check cbc, cmp, b12, esr, tsh, ana, rpr Spoke with daughter and told her about pending w/up.   2. Fall w displaced right intraticular distal radium fracture s/p Open treatment of right wrist intra-articular distal radius fracture 3 or more fragments 4/25 Appreciate orthopedic input   3.  Osteoporosis w hx of compression fractures of the spine Bone density as outpatient if none in the past 2 years Check vitamin D 25-oh and TSH Consider Forteo after discharge if has not been on in the past  4.  Hypokalemia Check cmp Replete as needed  5. Anemia Check cbc in am    DVT Prophylaxis Lovenox - SCDs   AM Labs Ordered, also please review Full Orders  Family Communication: Admission, patients condition and plan of care including tests being ordered have been discussed with the patient and daughter who indicate understanding and agree with the plan and Code Status.  Code Status  DNR  Likely DC to  ? Home vs SNF  Condition GUARDED    Admission status: inpatient  Time spent in minutes : 45 minutes   Jani Gravel M.D on 11/14/2016 at 7:16 PM  Between 7am to 7pm - Pager - [phone]. After 7pm go to [email or web address] - password [facility]  [facility] - Office  [phone]

## 2016-11-14 NOTE — Op Note (Signed)
PREOPERATIVE DIAGNOSIS: Displaced right intra-articular distal radius fracture 3 or more fragments Osteoporosis  POSTOPERATIVE DIAGNOSIS: Same  ATTENDING SURGEON: Dr. Gavin Pound who was scrubbed and present for the entire procedure  ASSISTANT SURGEON: Gertie Fey PA-C was scrubbed and necessary for application of the internal fixation reduction closure and splinting in a timely fashion  ANESTHESIA: Axillary block with IV sedation  OPERATIVE PROCEDURE: Open treatment of right wrist intra-articular distal radius fracture 3 or more fragments Right wrist brachial radialis tendon release and lengthening Radiographs 3 views right wrist  IMPLANTS: DVR cross lock narrow plate with 2 cc of Biomet stay graft bone graft substitute  RADIOGRAPHIC INTERPRETATION: AP lateral oblique views the wrist to show the volar plate fixation in place was good restoration of the radial height inclination and tilt  SURGICAL INDICATIONS: Mrs. Barkalow is a right-hand-dominant female who sustained a fall on outstretched right wrist. Patient seen and evaluated in the office. Patient is recommended undergo the above procedure. Risks benefits and alternatives discussed in detail with the fact family in a signed informed consent was obtained. Risks include but not limited to bleeding infection damage to nearby nerves arteries or tendons nonunion malunion hardware failure loss of motion of wrists and digits incomplete relief of symptoms and need for further surgical intervention  SURGICAL TECHNIQUE: Patient probably identified in the preoperative holding area marked for marker made on the right wrist indicate the correct operative site. Patient brought back Room placed supine on anesthesia and table the IV sedation was administered. Patient tolerated this well. A well-padded tourniquet was then placed on the right brachium and sealed with a 1000 drape. The right upper extremity and prepped and draped in normal  sterile fashion. Timeout was called cracks as of proceeding then begun. Attention then turned the right wrist. A longitudinal incision made directly over the FCR sheath. The FCR sheath was then opened proximally distally. The FPL was then carefully swept out of the way. The pronator quadratus was then elevated and the fracture site was then exposed. In order to reduce the fracture site brachial radialis was then carefully radially released off the radial styloid. Careful dissection was then carried out to protect the first dorsal compartment tendons. After tendon sheath after the tendon was then released attention was then turned open reduction. Open reduction of the intra-articular fracture 3 or more fragments was then carried out. The volar plate was then applied and held distally and proximally K wires. Plate position was then confirmed. Once this carried out the oblong screw hole was then placed proximally. Distal fixation was then carried out from an ulnar to radial direction with a combination distal locking pegs and screws. The wound was then irrigated. Shaft fixation was then carried out proximally with locking and nonlocking cortical screws. Following this the pronator quadratus was then closed with 2-0 Vicryl. Final radiographs then obtained. Subcutaneous tissues closed with 4-0 Vicryl. Skin was then closed with 4-0 Vicryl Rapide. Adaptic dressing sterile compressive bandage was then applied. The patient is then placed in a well-padded sugar tong splint and taken recovery room taken recovery room in good condition.  POSTOPERATIVE PLAN: Patient be admitted overnight for I IV antibiotics and pain control. Seen back in the office in approximately 2 weeks for wound care suture removal x-rays application of a short arm cast. Begin a therapy outpatient therapy regimen at the four-week mark radiographs at each visit.

## 2016-11-14 NOTE — Discharge Instructions (Signed)
KEEP BANDAGE CLEAN AND DRY °CALL OFFICE FOR F/U APPT [phone] in 14 days °KEEP HAND ELEVATED ABOVE HEART °OK TO APPLY ICE TO OPERATIVE AREA °CONTACT OFFICE IF ANY WORSENING PAIN OR CONCERNS. °

## 2016-11-14 NOTE — Anesthesia Procedure Notes (Signed)
Procedure Name: MAC Date/Time: 11/14/2016 3:50 PM Performed by: Garrison Columbus T Pre-anesthesia Checklist: Patient identified, Emergency Drugs available, Suction available and Patient being monitored Patient Re-evaluated:Patient Re-evaluated prior to inductionOxygen Delivery Method: Simple face mask Preoxygenation: Pre-oxygenation with 100% oxygen Intubation Type: IV induction Placement Confirmation: positive ETCO2 and breath sounds checked- equal and bilateral Dental Injury: Teeth and Oropharynx as per pre-operative assessment

## 2016-11-15 ENCOUNTER — Encounter (HOSPITAL_COMMUNITY): Payer: Self-pay | Admitting: Orthopedic Surgery

## 2016-11-15 DIAGNOSIS — S52571A Other intraarticular fracture of lower end of right radius, initial encounter for closed fracture: Secondary | ICD-10-CM | POA: Diagnosis not present

## 2016-11-15 DIAGNOSIS — R410 Disorientation, unspecified: Secondary | ICD-10-CM

## 2016-11-15 LAB — CBC
HCT: 25.9 % — ABNORMAL LOW (ref 36.0–46.0)
Hemoglobin: 8.4 g/dL — ABNORMAL LOW (ref 12.0–15.0)
MCH: 30 pg (ref 26.0–34.0)
MCHC: 32.4 g/dL (ref 30.0–36.0)
MCV: 92.5 fL (ref 78.0–100.0)
PLATELETS: 287 10*3/uL (ref 150–400)
RBC: 2.8 MIL/uL — AB (ref 3.87–5.11)
RDW: 13.2 % (ref 11.5–15.5)
WBC: 9.1 10*3/uL (ref 4.0–10.5)

## 2016-11-15 LAB — RPR: RPR Ser Ql: NONREACTIVE

## 2016-11-15 LAB — COMPREHENSIVE METABOLIC PANEL
ALK PHOS: 86 U/L (ref 38–126)
ALT: 19 U/L (ref 14–54)
AST: 26 U/L (ref 15–41)
Albumin: 3.3 g/dL — ABNORMAL LOW (ref 3.5–5.0)
Anion gap: 10 (ref 5–15)
BUN: 6 mg/dL (ref 6–20)
CALCIUM: 8.6 mg/dL — AB (ref 8.9–10.3)
CHLORIDE: 99 mmol/L — AB (ref 101–111)
CO2: 26 mmol/L (ref 22–32)
CREATININE: 0.58 mg/dL (ref 0.44–1.00)
GFR calc non Af Amer: >60 mL/min (ref >60)
Glucose, Bld: 152 mg/dL — ABNORMAL HIGH (ref 65–99)
Potassium: 3.1 mmol/L — ABNORMAL LOW (ref 3.5–5.1)
SODIUM: 135 mmol/L (ref 135–145)
Total Bilirubin: 1.4 mg/dL — ABNORMAL HIGH (ref 0.3–1.2)
Total Protein: 6.5 g/dL (ref 6.5–8.1)

## 2016-11-15 MED ORDER — POTASSIUM CHLORIDE CRYS ER 20 MEQ PO TBCR
40.0000 meq | EXTENDED_RELEASE_TABLET | Freq: Once | ORAL | Status: AC
  Administered 2016-11-15: 40 meq via ORAL
  Filled 2016-11-15: qty 2

## 2016-11-15 MED ORDER — MORPHINE SULFATE (PF) 4 MG/ML IV SOLN: 1.0000 mg | INTRAVENOUS | Status: DC | PRN

## 2016-11-15 NOTE — Evaluation (Signed)
Physical Therapy Evaluation Patient Details Name: [name] MRN: [id number] DOB: 10/04/1934 Today's Date: 11/15/2016   History of Present Illness  82 y.o. female, w Takotsubo syndrome, CAD, osteoarthritis, osteoporosis, s/p Open treatment of right wrist intra-articular distal radius fracture 3 or more fragment apparently has been confused since her fall.  baseline memory issues per her daughter.    Clinical Impression  Pt admitted with above diagnosis. Pt currently with functional limitations due to the deficits listed below (see PT Problem List). Pt will benefit from skilled PT to increase their independence and safety with mobility to allow discharge to the venue listed below.  Pt required +2 TOT to MAX A for bed mobility.  Recommend returning to SNF.  Pt confused and lethargic during session and unable to attempt standing.     Follow Up Recommendations SNF;Supervision/Assistance - 24 hour    Equipment Recommendations  None recommended by PT    Recommendations for Other Services       Precautions / Restrictions Precautions Precautions: Fall Restrictions Weight Bearing Restrictions: No (no WBS in chart)      Mobility  Bed Mobility Overal bed mobility: Needs Assistance Bed Mobility: Supine to Sit;Sit to Supine     Supine to sit: Total assist;+2 for physical assistance;HOB elevated Sit to supine: Total assist;+2 for physical assistance;+2 for safety/equipment   General bed mobility comments: bed pad utilized. Pt with difficulty bringing R foot completely onto floor in seated position. Lethargic.  Transfers                 General transfer comment: unable, too lethargic.  Ambulation/Gait                Stairs            Wheelchair Mobility    Modified Rankin (Stroke Patients Only)       Balance Overall balance assessment: History of Falls;Needs assistance Sitting-balance support: Single extremity supported;Feet supported;Feet  unsupported Sitting balance-Leahy Scale: Poor Sitting balance - Comments: Pt initially needing +2 assist for safety (1 person to provide min -mod physical assist to keep pt in seated position). Improved to min A +1 then fatigued to +2. Sat EOB about 5 minutes.      Standing balance-Leahy Scale: Zero                               Pertinent Vitals/Pain Pain Assessment: Faces Faces Pain Scale: Hurts even more Pain Location: RUE and back during scooting up in bed Pain Descriptors / Indicators: Moaning;Discomfort Pain Intervention(s): Limited activity within patient's tolerance;Monitored during session;Repositioned;Ice applied    Home Living Family/patient expects to be discharged to:: Skilled nursing facility                 Additional Comments: No family present during eval and pt not able to provided home setup information. Has been receiving rehab at [facility] before surgery. Recommend d/c back to SNF to continue rehab.    Prior Function Level of Independence: Needs assistance   Gait / Transfers Assistance Needed: Per PT note 11/10/16: per daughter, she assisted with meals, shopping.  ADL's / Homemaking Assistance Needed: Per OT note 11/10/16: daughter assisted with ADL as needed. Did her mother's medication management and IADL tasks.        Hand Dominance   Dominant Hand: Right    Extremity/Trunk Assessment   Upper Extremity Assessment Upper Extremity Assessment: Defer to OT evaluation RUE Deficits /  Details: s/p Open treatment of right wrist intra-articular distal radius fracture 3 or more fragments, Right wrist brachial radialis tendon release and lengthening. R forearm and hand in soft dressing wrap which pt disturbed somewhat during overnight AMS episode. RUE: Unable to fully assess due to pain;Unable to fully assess due to immobilization    Lower Extremity Assessment Lower Extremity Assessment: Generalized weakness    Cervical / Trunk Assessment Cervical  / Trunk Assessment: Kyphotic  Communication      Cognition Arousal/Alertness: Lethargic Behavior During Therapy: Flat affect Overall Cognitive Status: Difficult to assess                                 General Comments: very lethargic. Verbalizing at low volume, eyes closed most of session.      General Comments General comments (skin integrity, edema, etc.): Pt unable to state where she is this session. Tangental and confused responses at times. "where is ([name]'s?) baby?" At end of session elevated RUE onto pillow and reapplied ice.    Exercises     Assessment/Plan    PT Assessment Patient needs continued PT services  PT Problem List Decreased mobility;Decreased balance;Decreased activity tolerance;Decreased range of motion;Decreased strength;Decreased cognition;Decreased safety awareness;Decreased knowledge of use of DME;Decreased knowledge of precautions;Pain       PT Treatment Interventions DME instruction;Gait training;Functional mobility training;Therapeutic activities;Therapeutic exercise;Balance training    PT Goals (Current goals can be found in the Care Plan section)  Acute Rehab PT Goals Patient Stated Goal: none stated PT Goal Formulation: Patient unable to participate in goal setting Time For Goal Achievement: 11/29/16 Potential to Achieve Goals: Fair    Frequency Min 2X/week   Barriers to discharge        Co-evaluation   Reason for Co-Treatment: Necessary to address cognition/behavior during functional activity;For patient/therapist safety PT goals addressed during session: Mobility/safety with mobility OT goals addressed during session: ADL's and self-care       End of Session   Activity Tolerance: Patient limited by lethargy Patient left: in bed;with call bell/phone within reach;with bed alarm set Nurse Communication: Mobility status PT Visit Diagnosis: Unsteadiness on feet (R26.81);Muscle weakness (generalized) (M62.81);History of  falling (Z91.81)    Time: 0881-1031 PT Time Calculation (min) (ACUTE ONLY): 19 min   Charges:   PT Evaluation $PT Eval Low Complexity: 1 Procedure     PT G Codes:   PT G-Codes **NOT FOR INPATIENT CLASS** Functional Assessment Tool Used: AM-PAC 6 Clicks Basic Mobility;Clinical judgement Functional Limitation: Mobility: Walking and moving around Mobility: Walking and Moving Around Current Status (R9458): 100 percent impaired, limited or restricted Mobility: Walking and Moving Around Goal Status (P9292): At least 40 percent but less than 60 percent impaired, limited or restricted    Santiago Glad L. Tamala Julian, [place] Pager [phone] 11/15/2016   Galen Manila 11/15/2016, 12:50 PM

## 2016-11-15 NOTE — Discharge Summary (Signed)
Physician Discharge Summary  Patient ID: [name] MRN: [id number] DOB/AGE: [birth year] 82 y.o.  Admit date: 11/14/2016 Discharge date: 11/15/2016  Admission Diagnoses: Right distal radius comminuted fracture  Past Medical History:  Diagnosis Date  . Anemia   . Anxiety    confusion  . Blood transfusion    2005 maybe  . Coronary artery disease   . Depression   . Diverticular disease   . Esophageal reflux   . Falls frequently   . Headache(784.0)    takes Prilosec  . Hypertension   . IBS (irritable bowel syndrome)   . Osteoarthrosis, unspecified whether generalized or localized, lower leg   . Other and unspecified hyperlipidemia   . Shortness of breath    when my heart is not doing right  . Takotsubo syndrome   . Wrist fracture     Discharge Diagnoses:  Active Problems:   GAD (generalized anxiety disorder)   Acute delirium   Essential hypertension   CAD (coronary artery disease)   Dementia   Chronic diastolic heart failure (HCC)   Closed fracture of right distal radius   Surgeries: Procedure(s): OPEN REDUCTION INTERNAL FIXATION (ORIF) DISTAL RADIAL FRACTURE, with repair as indicated on 11/14/2016    Consultants: Treatment Team:  Sharyon Medicus, MD  Discharged Condition: Improved  Hospital Course: [name] is an 82 y.o. female who was admitted 11/14/2016 with a chief complaint of No chief complaint on file. , and found to have a diagnosis of Right distal radius comminuted fracture .  They were brought to the operating room on 11/14/2016 and underwent Procedure(s): OPEN REDUCTION INTERNAL FIXATION (ORIF) DISTAL RADIAL FRACTURE, with repair as indicated.    They were given perioperative antibiotics: Anti-infectives    Start     Dose/Rate Route Frequency Ordered Stop   11/14/16 2330  ceFAZolin (ANCEF) IVPB 1 g/50 mL premix     1 g 100 mL/hr over 30 Minutes Intravenous Every 8 hours 11/14/16 2019     11/14/16 2030  ceFAZolin (ANCEF) IVPB 1 g/50 mL premix  Status:   Discontinued     1 g 100 mL/hr over 30 Minutes Intravenous NOW 11/14/16 2019 11/14/16 2022   11/14/16 1500  ceFAZolin (ANCEF) IVPB 2g/100 mL premix     2 g 200 mL/hr over 30 Minutes Intravenous On call to O.R. 11/14/16 1344 11/14/16 1555    .  They were given sequential compression devices, early ambulation, and Other (comment) for DVT prophylaxis.  Recent vital signs: Patient Vitals for the past 24 hrs:  BP Temp Temp src Pulse Resp SpO2  11/15/16 1358 126/62 99 F (37.2 C) Oral 71 16 92 %  11/15/16 0530 137/87 98.6 F (37 C) Oral 81 16 95 %  11/14/16 2000 (!) 148/78 98.3 F (36.8 C) Oral 88 16 94 %  11/14/16 1946 - 98.2 F (36.8 C) - - - -  11/14/16 1945 - - - 83 19 99 %  11/14/16 1938 (!) 158/63 - - 81 17 99 %  11/14/16 1930 - - - 81 14 100 %  11/14/16 1923 (!) 156/74 - - 80 17 99 %  11/14/16 1915 - - - 81 (!) 21 97 %  11/14/16 1907 (!) 154/75 - - 84 16 98 %  11/14/16 1900 - - - 78 16 99 %  11/14/16 1855 131/72 - - 80 16 99 %  11/14/16 1845 - - - 76 15 100 %  11/14/16 1838 140/60 - - 73 16 100 %  11/14/16 1830 -  98.8 F (37.1 C) - 73 18 100 %  11/14/16 1822 (!) 145/78 - - 69 16 99 %  11/14/16 1815 - - - 72 14 100 %  11/14/16 1808 (!) 139/52 - - 73 19 100 %  11/14/16 1800 - - - 71 16 100 %  11/14/16 1753 (!) 158/72 - - 76 19 98 %  11/14/16 1745 - - - 75 16 100 %  11/14/16 1738 (!) 140/54 - - 74 18 100 %  11/14/16 1730 - - - 75 14 100 %  11/14/16 1723 - 97.3 F (36.3 C) - 79 13 99 %  11/14/16 1722 (!) 160/99 - - 80 - 100 %  .  Recent laboratory studies: Ct Head Wo Contrast  Result Date: 11/15/2016 CLINICAL DATA:  Acute onset of altered mental status. Severe confusion. Recent fall. Initial encounter. EXAM: CT HEAD WITHOUT CONTRAST TECHNIQUE: Contiguous axial images were obtained from the base of the skull through the vertex without intravenous contrast. COMPARISON:  CT of the head performed 11/09/2016 FINDINGS: Brain: No evidence of acute infarction, hemorrhage,  hydrocephalus, extra-axial collection or mass lesion/mass effect. Evaluation is suboptimal due to motion artifact. Prominence of the ventricles and sulci reflects mild cortical volume loss. Mild periventricular white matter change likely reflects small vessel ischemic microangiopathy. The brainstem and fourth ventricle are within normal limits. The basal ganglia are unremarkable in appearance. The cerebral hemispheres demonstrate grossly normal gray-white differentiation. No mass effect or midline shift is seen. Vascular: No hyperdense vessel or unexpected calcification. Skull: There is no evidence of fracture; visualized osseous structures are unremarkable in appearance. Sinuses/Orbits: The visualized portions of the orbits are within normal limits. The paranasal sinuses and mastoid air cells are well-aerated. Other: No significant soft tissue abnormalities are seen. IMPRESSION: 1. No evidence of traumatic intracranial injury or fracture. Evaluation is suboptimal due to motion artifact. 2. Mild cortical volume loss and scattered small vessel ischemic microangiopathy. Electronically Signed   By: Garald Balding M.D.   On: 11/15/2016 01:00    Discharge Medications:     Diagnostic Studies: Dg Ribs Unilateral W/chest Right  Result Date: 11/09/2016 CLINICAL DATA:  Fall at home this morning. Right-sided rib and chest pain. Initial encounter. EXAM: RIGHT RIBS AND CHEST - 3+ VIEW COMPARISON:  Chest radiograph on 11/21/2014 FINDINGS: No fracture or other bone lesions are seen involving the ribs. There is no evidence of pneumothorax or pleural effusion. Both lungs are clear. Heart size is normal. Stable ectasia of thoracic aorta. Bilateral breast implants noted, as well as lumbar spine fusion hardware and previous upper lumbar vertebroplasty. IMPRESSION: No acute findings. Electronically Signed   By: Earle Gell M.D.   On: 11/09/2016 10:55   Dg Wrist Complete Right  Result Date: 11/09/2016 CLINICAL DATA:  Closed  fracture distal radius postreduction EXAM: RIGHT WRIST - COMPLETE 3+ VIEW COMPARISON:  11/09/2016 FINDINGS: Three views of the right wrist submitted. Limited study by casting material artifact. Persistent comminuted fracture in distal right radius with dorsal angulation. No significant change in alignment from prior exam. IMPRESSION: Persistent comminuted fracture in distal right radius with dorsal angulation. No significant change in alignment from prior exam. Electronically Signed   By: Lahoma Crocker M.D.   On: 11/09/2016 17:01   Dg Wrist Complete Right  Result Date: 11/09/2016 CLINICAL DATA:  Postreduction films. EXAM: RIGHT WRIST - COMPLETE 3+ VIEW COMPARISON:  Earlier the same day FINDINGS: Fine bony detail obscured by the overlying plaster. Fracture the distal radial metaphysis again noted.  Bony alignment similar to the pre reduction films. Bones appear demineralized. IMPRESSION: No substantial interval change status post closed reduction. Electronically Signed   By: Misty Stanley M.D.   On: 11/09/2016 14:35   Dg Wrist Complete Right  Result Date: 11/09/2016 CLINICAL DATA:  Fall at home this a.m landing onto rt side complains of rt sided chest pain, rt wrist pain with deformity EXAM: RIGHT WRIST - COMPLETE 3+ VIEW COMPARISON:  None. FINDINGS: Fracture of the distal radial metaphysis with dorsal angulation. Fracture does not appear to enter the articular surface. Radiocarpal joint is intact. Mild comminution of the fracture. Fracture of the ulnar styloid age-indeterminate. IMPRESSION: 1. Comminuted fracture of the distal radius with dorsal angulation. 2. Radiocarpal joint intact. 3. Age-indeterminate ulnar styloid fracture. Electronically Signed   By: Suzy Bouchard M.D.   On: 11/09/2016 10:55   Ct Head Wo Contrast  Result Date: 11/15/2016 CLINICAL DATA:  Acute onset of altered mental status. Severe confusion. Recent fall. Initial encounter. EXAM: CT HEAD WITHOUT CONTRAST TECHNIQUE: Contiguous axial  images were obtained from the base of the skull through the vertex without intravenous contrast. COMPARISON:  CT of the head performed 11/09/2016 FINDINGS: Brain: No evidence of acute infarction, hemorrhage, hydrocephalus, extra-axial collection or mass lesion/mass effect. Evaluation is suboptimal due to motion artifact. Prominence of the ventricles and sulci reflects mild cortical volume loss. Mild periventricular white matter change likely reflects small vessel ischemic microangiopathy. The brainstem and fourth ventricle are within normal limits. The basal ganglia are unremarkable in appearance. The cerebral hemispheres demonstrate grossly normal gray-white differentiation. No mass effect or midline shift is seen. Vascular: No hyperdense vessel or unexpected calcification. Skull: There is no evidence of fracture; visualized osseous structures are unremarkable in appearance. Sinuses/Orbits: The visualized portions of the orbits are within normal limits. The paranasal sinuses and mastoid air cells are well-aerated. Other: No significant soft tissue abnormalities are seen. IMPRESSION: 1. No evidence of traumatic intracranial injury or fracture. Evaluation is suboptimal due to motion artifact. 2. Mild cortical volume loss and scattered small vessel ischemic microangiopathy. Electronically Signed   By: Garald Balding M.D.   On: 11/15/2016 01:00   Ct Head Wo Contrast  Result Date: 11/09/2016 CLINICAL DATA:  Patient presents to the emergency department after tripping over her cat food bowl this morning and landing on her right wrist and right lateral chest. She presents with obvious deformity of her right wrist. She denies head injury or headache at this time. She denies neck pain. She denies recent illness. She is alert and oriented 4. She presents via EMS. She denies hip pain. Her only complaint is focused right wrist pain. She denies neck pain EXAM: CT HEAD WITHOUT CONTRAST CT CERVICAL SPINE WITHOUT CONTRAST  TECHNIQUE: Multidetector CT imaging of the head and cervical spine was performed following the standard protocol without intravenous contrast. Multiplanar CT image reconstructions of the cervical spine were also generated. COMPARISON:  11/20/2014 FINDINGS: CT HEAD FINDINGS Brain: No evidence of acute infarction, hemorrhage, hydrocephalus, extra-axial collection or mass lesion/mass effect. Vascular: No hyperdense vessel or unexpected calcification. Skull: Normal. Negative for fracture or focal lesion. Sinuses/Orbits: No acute finding. Other: None. CT CERVICAL SPINE FINDINGS Alignment: Hyperextended but otherwise unremarkable with no spondylolisthesis. Skull base and vertebrae: No acute fracture. No primary bone lesion or focal pathologic process. Soft tissues and spinal canal: No prevertebral fluid or swelling. No visible canal hematoma. Disc levels: Mild to moderate loss disc height at C3-C4. Moderate loss disc height at C5-C6 and  C6-C7. Endplate spurring at these levels. Bilateral facet degenerative change. Bones are demineralized. There are mild areas of disc bulging, but no convincing disc herniation. Upper chest: Apical pleuroparenchymal scarring.  No acute findings. Other: None. IMPRESSION: HEAD CT:  No acute intracranial abnormalities.  No skull fracture. CERVICAL CT:  No fracture or acute finding. Electronically Signed   By: Lajean Manes M.D.   On: 11/09/2016 17:54   Ct Cervical Spine Wo Contrast  Result Date: 11/09/2016 CLINICAL DATA:  Patient presents to the emergency department after tripping over her cat food bowl this morning and landing on her right wrist and right lateral chest. She presents with obvious deformity of her right wrist. She denies head injury or headache at this time. She denies neck pain. She denies recent illness. She is alert and oriented 4. She presents via EMS. She denies hip pain. Her only complaint is focused right wrist pain. She denies neck pain EXAM: CT HEAD WITHOUT  CONTRAST CT CERVICAL SPINE WITHOUT CONTRAST TECHNIQUE: Multidetector CT imaging of the head and cervical spine was performed following the standard protocol without intravenous contrast. Multiplanar CT image reconstructions of the cervical spine were also generated. COMPARISON:  11/20/2014 FINDINGS: CT HEAD FINDINGS Brain: No evidence of acute infarction, hemorrhage, hydrocephalus, extra-axial collection or mass lesion/mass effect. Vascular: No hyperdense vessel or unexpected calcification. Skull: Normal. Negative for fracture or focal lesion. Sinuses/Orbits: No acute finding. Other: None. CT CERVICAL SPINE FINDINGS Alignment: Hyperextended but otherwise unremarkable with no spondylolisthesis. Skull base and vertebrae: No acute fracture. No primary bone lesion or focal pathologic process. Soft tissues and spinal canal: No prevertebral fluid or swelling. No visible canal hematoma. Disc levels: Mild to moderate loss disc height at C3-C4. Moderate loss disc height at C5-C6 and C6-C7. Endplate spurring at these levels. Bilateral facet degenerative change. Bones are demineralized. There are mild areas of disc bulging, but no convincing disc herniation. Upper chest: Apical pleuroparenchymal scarring.  No acute findings. Other: None. IMPRESSION: HEAD CT:  No acute intracranial abnormalities.  No skull fracture. CERVICAL CT:  No fracture or acute finding. Electronically Signed   By: Lajean Manes M.D.   On: 11/09/2016 17:54   Ct Angio Chest/abd/pel For Dissection W And/or Wo Contrast  Result Date: 11/09/2016 CLINICAL DATA:  Syncope with hypertension and hypoxia. Evaluate for aortic dissection. EXAM: CT ANGIOGRAPHY CHEST, ABDOMEN AND PELVIS TECHNIQUE: Multidetector CT imaging through the chest, abdomen and pelvis was performed using the standard protocol during bolus administration of intravenous contrast. Multiplanar reconstructed images and MIPs were obtained and reviewed to evaluate the vascular anatomy. CONTRAST:   100 CC Isovue 370 COMPARISON:  None. FINDINGS: CTA CHEST FINDINGS Study is markedly motion degraded. Cardiovascular: Precontrast imaging shows no hyperdense crescent in the wall of the thoracic aorta to suggest acute intramural hematoma. Ascending thoracic aorta measures approximately 3.3 cm diameter. Within the limitation of 9 gated technique and substantial patient motion, no definite dissection flap is identified in the thoracic aorta. Heart size is normal. No pericardial effusion. No large central pulmonary embolus in the main pulmonary outflow tract or either main pulmonary artery. Mediastinum/Nodes: No mediastinal lymphadenopathy. There is no hilar lymphadenopathy. There is no axillary lymphadenopathy. Lungs/Pleura: No focal airspace consolidation. No overt pulmonary edema. No substantial pleural effusion. Given the extreme motion artifact, small pulmonary nodules could be obscured. Musculoskeletal: No gross lytic or sclerotic osseous abnormality is identified. Assessment of bony anatomy for fracture is markedly limited by the motion artifact. Review of the MIP images confirms the  above findings. CTA ABDOMEN AND PELVIS FINDINGS VASCULAR Aorta: No thoracic aortic aneurysm. Celiac: Patent. Assessment for stenosis not possible due to motion artifact. SMA: Patent. Assessment for proximal stenosis not possible due to motion artifact. Renals: Patent bilaterally. Accessory left renal artery noted. Fine detail obscured by motion artifact. IMA: Not visualized but there is substantial motion artifact in this region. Veins: Portal vein and superior mesenteric vein are patent. IVC appears patent although assessment is degraded by streak artifact from spinal fixation hardware and motion artifact. Review of the MIP images confirms the above findings. NON-VASCULAR Hepatobiliary: No gross abnormality in the liver parenchyma. There is no evidence for gallstones, gallbladder wall thickening, or pericholecystic fluid. No  intrahepatic or extrahepatic biliary dilation. Pancreas: Limited assessment due to motion without focal abnormality identified. No dilatation of the main pancreatic duct. Spleen: Unremarkable. Adrenals/Urinary Tract: No adrenal nodule or mass. No hydronephrosis or gross renal mass evident. No evidence for hydroureter. Bladder is obscured by streak artifact from right hip replacement. Stomach/Bowel: Stomach is nondistended. Duodenum not well evaluated. No evidence for small bowel obstruction. No evidence for colonic obstruction. Bowel extends into a right groin hernia but cannot be clearly discerned given streak artifact from the hip replacement and patient motion. Lymphatic: No gross lymphadenopathy can be identified in the abdomen or pelvis although right pelvic sidewall not well evaluated. Reproductive: Obscured. Other: No substantial intraperitoneal free fluid. Musculoskeletal: Diffuse demineralization. Right hip replacement. Old inferior right pubic ramus fracture. Status post lower lumbar fusion. Multilevel thoracolumbar compression fractures. Review of the MIP images confirms the above findings. IMPRESSION: 1. Study is markedly degraded by patient motion throughout image acquisition imaging through the pelvis further degraded by lumbar fusion hardware and right hip replacement. 2. Within the above-stated limitation, no definite dissection is identified in the thoraco abdominal aorta. There is no thoracoabdominal aortic aneurysm. 3. Right groin hernia contains bowel but is largely obscured by streak artifact and motion artifact. No findings to suggest bowel obstruction at this time. 4. Diffuse bony demineralization with multiple thoracolumbar compression fractures. These are age-indeterminate and cannot be further evaluated given the marked motion degradation. Electronically Signed   By: Misty Stanley M.D.   On: 11/09/2016 21:25    They benefited maximally from their hospital stay and there were no  complications.     Disposition: 03-Skilled [place], [place] In 2 weeks.   Specialty:  Orthopedic Surgery Contact information: [address] [place] [address] [phone]            Signed: Linna Hoff 11/15/2016, 3:47 PM

## 2016-11-15 NOTE — Progress Notes (Signed)
[facility] at bedside to transport patient to [facility]. Per Driver patient was brought in yesterday to surgery with a patients wheelchair. There is no wheel chair in room or short stay where patient was dropped off. Camera operator, Mudlogger and [place] made aware of issue. Per [facility] employee the social worker from [facility] will follow up with security tomorrow. Per Education officer, museum at [facility] see if [facility] can pick patient up. This RN called [employer]. Jody will set up transportation with [facility].

## 2016-11-15 NOTE — Progress Notes (Addendum)
Patient will discharge to [facility] Anticipated discharge date: 4/26 Family notified: pt dtr Transportation by facility transport- will arrive around 5-5:30pm Report #: [phone]  CSW signing off.  Jorge Ny, LCSW Clinical Social Worker [phone]

## 2016-11-15 NOTE — Progress Notes (Signed)
PT Cancellation Note  Patient Details Name: [name] MRN: [id number] DOB: January 04, 1935   Cancelled Treatment:    Reason Eval/Treat Not Completed: Other (comment). Per nursing, pt calmer than over night, but still confused.  Will check back later and see if cognition improves to complete evaluation.   Galen Manila 11/15/2016, 9:04 AM

## 2016-11-15 NOTE — Progress Notes (Signed)
Chart reviewed.  Await skilled nursing facility placement  Continue with her current splint and elevate nonweightbearing in the right upper extremity

## 2016-11-15 NOTE — Progress Notes (Signed)
OT Cancellation Note  Patient Details Name: Dinorah Masullo MRN: [id number] DOB: 12-02-1934   Cancelled Treatment:    Reason Eval/Treat Not Completed: Other (comment). Per conversation with nurse, pt is calmer now but very confused. Will reattempt later today hopefully when pt's cognition improves.    Tyrone Schimke OTR/L Pager: [phone]  11/15/2016, 8:59 AM

## 2016-11-15 NOTE — Evaluation (Signed)
Occupational Therapy Evaluation Patient Details Name: [name] MRN: [id number] DOB: 04-Feb-1935 Today's Date: 11/15/2016    History of Present Illness 82 y.o. female, w Takotsubo syndrome, CAD, osteoarthritis, osteoporosis, s/p Open treatment of right wrist intra-articular distal radius fracture 3 or more fragment apparently has been confused since her fall.  baseline memory issues per her daughter.     Clinical Impression   Pt admitted with the above diagnoses and presents with below problem list. Pt will benefit from continued acute OT to address the below listed deficits and maximize independence with basic ADLs prior to d/c to next venue. Pt currently max A to total A with ADLs. Level of arousal impacting assist level. Pt pleasant, confused, and very lethargic during session. Total A +2 for bed mobility. Able to achieve +1 assist to sit EOB, then quickly fatigued back to needing +2.  Recommend returning to SNF for rehab. No family present during evaluation.     Follow Up Recommendations  SNF;Supervision/Assistance - 24 hour    Equipment Recommendations  Other (comment) (TBA at next venue)    Recommendations for Other Services       Precautions / Restrictions Precautions Precautions: Fall Restrictions Weight Bearing Restrictions:  (no WBS in chart)      Mobility Bed Mobility Overal bed mobility: Needs Assistance Bed Mobility: Supine to Sit;Sit to Supine     Supine to sit: Total assist;+2 for physical assistance;HOB elevated Sit to supine: Total assist;+2 for physical assistance;+2 for safety/equipment   General bed mobility comments: bed pad utilized. Pt with difficulty bringing R foot completely onto floor in seated position. Lethargic.  Transfers                 General transfer comment: unable, too lethargic.    Balance Overall balance assessment: History of Falls;Needs assistance Sitting-balance support: Single extremity supported;Feet unsupported;Feet  supported Sitting balance-Leahy Scale: Poor Sitting balance - Comments: Pt initially needing +2 assist for safety (1 person to provide min -mod physical assist to keep pt in seated position). Improved to min A +1 then fatigued to +2. Sat EOB about 5 minutes.                                    ADL either performed or assessed with clinical judgement   ADL Overall ADL's : Needs assistance/impaired Eating/Feeding: Maximal assistance;Sitting   Grooming: Maximal assistance;Sitting Grooming Details (indicate cue type and reason): washed face with cueing provided. poor sitting balance. Lethargy impacting session.  Upper Body Bathing: Maximal assistance;Sitting   Lower Body Bathing: Maximal assistance;+2 for physical assistance;Sitting/lateral leans;Bed level   Upper Body Dressing : Maximal assistance;Sitting   Lower Body Dressing: Maximal assistance;+2 for physical assistance;Sitting/lateral leans;Bed level                 General ADL Comments: Max A, +2 at times for all ADL. EOB eval only due to level of arousal. Initially somewhat more alert EOB but fatigued quickly.      Vision         Perception     Praxis      Pertinent Vitals/Pain Pain Assessment: Faces Faces Pain Scale: Hurts even more Pain Location: RUE and back during scooting up in bed Pain Descriptors / Indicators: Moaning;Discomfort Pain Intervention(s): Limited activity within patient's tolerance;Monitored during session;Repositioned;Ice applied     Hand Dominance Right   Extremity/Trunk Assessment Upper Extremity Assessment Upper Extremity Assessment: Generalized  weakness;RUE deficits/detail;Difficult to assess due to impaired cognition RUE Deficits / Details: s/p Open treatment of right wrist intra-articular distal radius fracture 3 or more fragments, Right wrist brachial radialis tendon release and lengthening. R forearm and hand in soft dressing wrap which pt disturbed somewhat during  overnight AMS episode. RUE: Unable to fully assess due to pain;Unable to fully assess due to immobilization   Lower Extremity Assessment Lower Extremity Assessment: Defer to PT evaluation   Cervical / Trunk Assessment Cervical / Trunk Assessment: Kyphotic   Communication     Cognition Arousal/Alertness: Lethargic Behavior During Therapy: Flat affect Overall Cognitive Status: Difficult to assess                                 General Comments: very lethargic. Verbalizing at low volume, eyes closed most of session.   General Comments  Pt unable to state where she is this session. Tangental and confused responses at times. "where is (Monica's?) baby?" At end of session elevated RUE onto pillow and reapplied ice.    Exercises     Shoulder Instructions      Home Living Family/patient expects to be discharged to:: Unsure                                 Additional Comments: No family present during eval and pt not able to provided home setup information. Has been receiving rehab at [facility] before surgery. Recommend d/c back to SNF to continue rehab.      Prior Functioning/Environment Level of Independence: Needs assistance  Gait / Transfers Assistance Needed: Per PT note 11/10/16: per daughter, she assisted with meals, shopping. ADL's / Homemaking Assistance Needed: Per OT note 11/10/16: daughter assisted with ADL as needed. Did her mother's medication management and IADL tasks.            OT Problem List: Decreased strength;Decreased range of motion;Decreased activity tolerance;Impaired balance (sitting and/or standing);Decreased coordination;Decreased cognition;Decreased safety awareness;Decreased knowledge of use of DME or AE;Impaired UE functional use;Pain;Increased edema      OT Treatment/Interventions: Self-care/ADL training;Therapeutic exercise;DME and/or AE instruction;Therapeutic activities;Cognitive remediation/compensation;Patient/family  education;Balance training    OT Goals(Current goals can be found in the care plan section) Acute Rehab OT Goals OT Goal Formulation: Patient unable to participate in goal setting Time For Goal Achievement: 11/29/16 Potential to Achieve Goals: Fair ADL Goals Pt Will Perform Grooming: sitting;with mod assist Pt Will Perform Upper Body Bathing: sitting;with mod assist Pt Will Perform Lower Body Bathing: with mod assist;sit to/from stand Pt Will Transfer to Toilet: with mod assist;stand pivot transfer;bedside commode Pt Will Perform Toileting - Clothing Manipulation and hygiene: with mod assist;sit to/from stand Additional ADL Goal #1: Pt will complete bed mobility at min +2 assist level to prepare for OOB ADLs.   OT Frequency: Min 2X/week   Barriers to D/C:            Co-evaluation PT/OT/SLP Co-Evaluation/Treatment: Yes Reason for Co-Treatment: Necessary to address cognition/behavior during functional activity;For patient/therapist safety   OT goals addressed during session: ADL's and self-care      End of Session    Activity Tolerance: Patient limited by lethargy Patient left: in bed;with call bell/phone within reach;with bed alarm set;with SCD's reapplied Spoke with RN regarding disturbed dressing on R forearm. Nurse reports pt did this during AMS episode last night and has been leaving it  alone today.  OT Visit Diagnosis: Unsteadiness on feet (R26.81);Repeated falls (R29.6);Muscle weakness (generalized) (M62.81);Pain;Cognitive communication deficit (R41.841) Pain - Right/Left: Right Pain - part of body: Arm (and back)                Time: 7544-9201 OT Time Calculation (min): 19 min Charges:  OT General Charges $OT Visit: 1 Procedure OT Evaluation $OT Eval Low Complexity: 1 Procedure G-Codes: OT G-codes **NOT FOR INPATIENT CLASS** Functional Assessment Tool Used: AM-PAC 6 Clicks Daily Activity Functional Limitation: Self care Self Care Current Status (E0712): At least 80  percent but less than 100 percent impaired, limited or restricted Self Care Goal Status (R9758): At least 20 percent but less than 40 percent impaired, limited or restricted     Clover Mealy OTR/L Pager: [phone]  11/15/2016, 12:18 PM

## 2016-11-15 NOTE — Progress Notes (Signed)
[facility] transportation arranged on behalf of pt.  Creta Levin, LCSW Evening/ED CSW [phone]

## 2016-11-15 NOTE — Progress Notes (Signed)
Pt. Brought back form radiology after head CT screaming at the top of her lungs that someone is trying to kill her, this Probation officer and other nurses on the floor try to calm her down,  pt. Became combative hitting and scratching staff members, I tried to give her xanax with apple sauce she slap the medication cup from my hand and got the apple sauce all over my face and uniform, she pulled her PIV out and was trying to remove her compression dressing from her right arm, Dr. Stann Mainland on call notified, order for lorazepam 0.5mg  iv executed, pt's daughter also notified.

## 2016-11-15 NOTE — Progress Notes (Addendum)
[facility] Consultation Progress Note  Patient: [name] WNU:[id number]   PCP: Woody Seller, MD DOB: 1934/11/10   DOA: 11/14/2016   DOS: 11/15/2016   Date of Service: the patient was seen and examined on 11/15/2016 Primary service: Iran Planas, MD   Subjective: feeling better, last night was really agitated and confused, this AM no agitation and no acute events, confusion at baseline  Brief hospital course: Pt. with PMH of Takotsubo syndrome, CAD, osteoarthritis, osteoporosis, s/p  Open treatment of right wrist intra-articular distal radius fracture, was found to have acute encephalopathy due to delirium post- op. Currently further plan is continue monitoring.  Assessment and Plan: 1. Post-op delirium  Dementia with agitation Currently resolved, her confusion is at her baseline, CT head, metabolic work up unremarkable. No focal deficit. May need atypical antipsychotics for sundowning.  Minimize other psychotropic meds  2. Fall w displaced right intraticular distal radium fracture s/p Open treatment of right wrist intra-articular distal radius fracture 3 or more fragments 4/25 Appreciate orthopedic input   3.  Osteoporosis w hx of compression fractures of the spine Bone density as outpatient if none in the past 2 years Consider Forteo after discharge if has not been on in the past  4.  Hypokalemia Replaced   5. Anemia Hb relatively stable. Continue to monitor. Expected post-op blood anemia.  Bowel regimen: last BM 11/14/2016 Diet: regular diet DVT Prophylaxis: subcutaneous Heparin  Family Communication: no family was present at bedside, at the time of interview.   Disposition: We will continue to follow the patient.   Recommendation on discharge: Pt safe from medical point of view to be discharge back to SNF. Repeat Hb in 1 week  Other Consultants: none Procedures: Open treatment of right wrist intra-articular distal radius fracture 3 or more  fragments Antibiotics: Anti-infectives    Start     Dose/Rate Route Frequency Ordered Stop   11/14/16 2330  ceFAZolin (ANCEF) IVPB 1 g/50 mL premix     1 g 100 mL/hr over 30 Minutes Intravenous Every 8 hours 11/14/16 2019     11/14/16 2030  ceFAZolin (ANCEF) IVPB 1 g/50 mL premix  Status:  Discontinued     1 g 100 mL/hr over 30 Minutes Intravenous NOW 11/14/16 2019 11/14/16 2022   11/14/16 1500  ceFAZolin (ANCEF) IVPB 2g/100 mL premix     2 g 200 mL/hr over 30 Minutes Intravenous On call to O.R. 11/14/16 1344 11/14/16 1555      Objective: Physical Exam: Vitals:   11/14/16 1945 11/14/16 1946 11/14/16 2000 11/15/16 0530  BP:   (!) 148/78 137/87  Pulse: 83  88 81  Resp: 19  16 16   Temp:  98.2 F (36.8 C) 98.3 F (36.8 C) 98.6 F (37 C)  TempSrc:   Oral Oral  SpO2: 99%  94% 95%  Weight:        Intake/Output Summary (Last 24 hours) at 11/15/16 1253 Last data filed at 11/15/16 1000  Gross per 24 hour  Intake              840 ml  Output               25 ml  Net              815 ml   Filed Weights   11/14/16 1351  Weight: 51.3 kg (113 lb)    General: Alert, Awake and Oriented to Time, Place and Person. Appear in mild distress, affect appropriate Eyes: PERRL, Conjunctiva  normal ENT: Oral Mucosa clear moist. Neck: difficult to assess JVD, no Abnormal Mass Or lumps Cardiovascular: S1 and S2 Present, aortic systolic Murmur, Peripheral Pulses Present Respiratory: Bilateral Air entry equal and Decreased, no use of accessory muscle, Clear to Auscultation, no Crackles, no wheezes Abdomen: Bowel Sound present, Soft and no tenderness Skin: redness no, no Rash, no induration Extremities: no Pedal edema, no calf tenderness Neurologic: Grossly no focal neuro deficit. Bilaterally Equal motor strength  Data Reviewed: CBC:  Recent Labs Lab 11/09/16 1018 11/10/16 0524 11/11/16 0819 11/14/16 1401 11/15/16 0719  WBC 12.2* 10.0 7.8 7.5 9.1  HGB 11.3* 10.2* 8.6* 9.5* 8.4*  HCT  33.7* 29.8* 25.3* 27.1* 25.9*  MCV 91.3 90.6 91.7 90.3 92.5  PLT 245 215 183 287 185   Basic Metabolic Panel:  Recent Labs Lab 11/09/16 1018 11/09/16 2218 11/10/16 0524 11/14/16 1401 11/14/16 1910 11/15/16 0923  NA 136  --  135 138 139 135  K 3.3*  --  3.6 3.1* 3.6 3.1*  CL 101  --  103 105 104 99*  CO2 28  --  23 23 29 26   GLUCOSE 143*  --  139* 119* 109* 152*  BUN 13  --  11 14 12 6   CREATININE 0.85  --  0.66 0.72 0.67 0.58  CALCIUM 8.7*  --  8.5* 8.8* 8.5* 8.6*  MG  --  1.6*  --   --   --   --    Liver Function Tests:  Recent Labs Lab 11/14/16 1910 11/15/16 0923  AST 21 26  ALT 18 19  ALKPHOS 68 86  BILITOT 0.9 1.4*  PROT 5.6* 6.5  ALBUMIN 3.0* 3.3*   No results for input(s): LIPASE, AMYLASE in the last 168 hours. No results for input(s): AMMONIA in the last 168 hours. Cardiac Enzymes:  Recent Labs Lab 11/09/16 2218 11/10/16 0524 11/10/16 1044  TROPONINI <0.03 0.03* 0.03*   BNP (last 3 results)  Recent Labs  11/10/16 1747  BNP 224.0*   CBG:  Recent Labs Lab 11/10/16 0616 11/10/16 0737 11/11/16 0752  GLUCAP 124* 122* 100*   Recent Results (from the past 240 hour(s))  MRSA PCR Screening     Status: None   Collection Time: 11/10/16  6:03 AM  Result Value Ref Range Status   MRSA by PCR NEGATIVE NEGATIVE Final    Comment:        The GeneXpert MRSA Assay (FDA approved for NASAL specimens only), is one component of a comprehensive MRSA colonization surveillance program. It is not intended to diagnose MRSA infection nor to guide or monitor treatment for MRSA infections.   Surgical pcr screen     Status: None   Collection Time: 11/14/16  2:21 PM  Result Value Ref Range Status   MRSA, PCR NEGATIVE NEGATIVE Final   Staphylococcus aureus NEGATIVE NEGATIVE Final    Comment:        The Xpert SA Assay (FDA approved for NASAL specimens in patients over 50 years of age), is one component of a comprehensive surveillance program.  Test  performance has been validated by [facility] for patients greater than or equal to 91 year old. It is not intended to diagnose infection nor to guide or monitor treatment.     Studies: Ct Head Wo Contrast  Result Date: 11/15/2016 CLINICAL DATA:  Acute onset of altered mental status. Severe confusion. Recent fall. Initial encounter. EXAM: CT HEAD WITHOUT CONTRAST TECHNIQUE: Contiguous axial images were obtained from the base of  the skull through the vertex without intravenous contrast. COMPARISON:  CT of the head performed 11/09/2016 FINDINGS: Brain: No evidence of acute infarction, hemorrhage, hydrocephalus, extra-axial collection or mass lesion/mass effect. Evaluation is suboptimal due to motion artifact. Prominence of the ventricles and sulci reflects mild cortical volume loss. Mild periventricular white matter change likely reflects small vessel ischemic microangiopathy. The brainstem and fourth ventricle are within normal limits. The basal ganglia are unremarkable in appearance. The cerebral hemispheres demonstrate grossly normal gray-white differentiation. No mass effect or midline shift is seen. Vascular: No hyperdense vessel or unexpected calcification. Skull: There is no evidence of fracture; visualized osseous structures are unremarkable in appearance. Sinuses/Orbits: The visualized portions of the orbits are within normal limits. The paranasal sinuses and mastoid air cells are well-aerated. Other: No significant soft tissue abnormalities are seen. IMPRESSION: 1. No evidence of traumatic intracranial injury or fracture. Evaluation is suboptimal due to motion artifact. 2. Mild cortical volume loss and scattered small vessel ischemic microangiopathy. Electronically Signed   By: Garald Balding M.D.   On: 11/15/2016 01:00     Scheduled Meds: . calcium-vitamin D  1 tablet Oral Q breakfast  . carvedilol  3.125 mg Oral BID WC  . diphenoxylate-atropine  1 tablet Oral BID  . escitalopram  20 mg  Oral BID  . midazolam  2 mg Intravenous Once  . multivitamin with minerals  1 tablet Oral Daily  . pantoprazole  40 mg Oral Daily  . vitamin B-12  1,000 mcg Oral Daily  . vitamin C  1,000 mg Oral Daily   Continuous Infusions: .  ceFAZolin (ANCEF) IV 1 g (11/15/16 0559)  . dextrose 5 % and 0.45 % NaCl with KCl 20 mEq/L 50 mL/hr at 11/14/16 2051  . methocarbamol (ROBAXIN)  IV     PRN Meds: ALPRAZolam, aspirin EC, bisacodyl, diphenhydrAMINE, docusate sodium, HYDROcodone-acetaminophen, methocarbamol **OR** methocarbamol (ROBAXIN)  IV, morphine injection, nitroGLYCERIN, ondansetron **OR** ondansetron (ZOFRAN) IV, oxyCODONE-acetaminophen, polyethylene glycol, traMADol  Time spent: 30 minutes  Author: Berle Mull, MD [facility] Pager: [phone] 11/15/2016 12:53 PM  If 7PM-7AM, please contact night-coverage at [email or web address], password [email or web address]

## 2016-11-15 NOTE — Progress Notes (Signed)
[facility] arrived, patient transferred onto stretcher. Belongings in bag and sent with transporter.

## 2016-11-15 NOTE — Discharge Summary (Signed)
Physician Discharge Summary  Patient ID: [name] MRN: [id number] DOB/AGE: 24-Dec-1934 82 y.o.  Admit date: 11/14/2016 Discharge date:   Admission Diagnoses: Right distal radius comminuted fracture  Past Medical History:  Diagnosis Date  . Anemia   . Anxiety    confusion  . Blood transfusion    2005 maybe  . Coronary artery disease   . Depression   . Diverticular disease   . Esophageal reflux   . Falls frequently   . Headache(784.0)    takes Prilosec  . Hypertension   . IBS (irritable bowel syndrome)   . Osteoarthrosis, unspecified whether generalized or localized, lower leg   . Other and unspecified hyperlipidemia   . Shortness of breath    when my heart is not doing right  . Takotsubo syndrome   . Wrist fracture     Discharge Diagnoses:  Active Problems:   GAD (generalized anxiety disorder)   Acute delirium   Essential hypertension   CAD (coronary artery disease)   Dementia   Chronic diastolic heart failure (HCC)   Closed fracture of right distal radius   Surgeries: Procedure(s): OPEN REDUCTION INTERNAL FIXATION (ORIF) DISTAL RADIAL FRACTURE, with repair as indicated on 11/14/2016    Consultants: Treatment Team:  Sharyon Medicus, MD  Discharged Condition: Improved  Hospital Course: [name] is an 82 y.o. female who was admitted 11/14/2016 with a chief complaint of No chief complaint on file. , and found to have a diagnosis of Right distal radius comminuted fracture .  They were brought to the operating room on 11/14/2016 and underwent Procedure(s): OPEN REDUCTION INTERNAL FIXATION (ORIF) DISTAL RADIAL FRACTURE, with repair as indicated.    They were given perioperative antibiotics: Anti-infectives    Start     Dose/Rate Route Frequency Ordered Stop   11/14/16 2330  ceFAZolin (ANCEF) IVPB 1 g/50 mL premix     1 g 100 mL/hr over 30 Minutes Intravenous Every 8 hours 11/14/16 2019     11/14/16 2030  ceFAZolin (ANCEF) IVPB 1 g/50 mL premix  Status:  Discontinued      1 g 100 mL/hr over 30 Minutes Intravenous NOW 11/14/16 2019 11/14/16 2022   11/14/16 1500  ceFAZolin (ANCEF) IVPB 2g/100 mL premix     2 g 200 mL/hr over 30 Minutes Intravenous On call to O.R. 11/14/16 1344 11/14/16 1555    .  They were given sequential compression devices, early ambulation, and Other (comment)ambulation for DVT prophylaxis.  Recent vital signs: Patient Vitals for the past 24 hrs:  BP Temp Temp src Pulse Resp SpO2  11/15/16 1358 126/62 99 F (37.2 C) Oral 71 16 92 %  11/15/16 0530 137/87 98.6 F (37 C) Oral 81 16 95 %  11/14/16 2000 (!) 148/78 98.3 F (36.8 C) Oral 88 16 94 %  11/14/16 1946 - 98.2 F (36.8 C) - - - -  11/14/16 1945 - - - 83 19 99 %  11/14/16 1938 (!) 158/63 - - 81 17 99 %  11/14/16 1930 - - - 81 14 100 %  11/14/16 1923 (!) 156/74 - - 80 17 99 %  11/14/16 1915 - - - 81 (!) 21 97 %  11/14/16 1907 (!) 154/75 - - 84 16 98 %  11/14/16 1900 - - - 78 16 99 %  11/14/16 1855 131/72 - - 80 16 99 %  11/14/16 1845 - - - 76 15 100 %  11/14/16 1838 140/60 - - 73 16 100 %  11/14/16 1830 -  98.8 F (37.1 C) - 73 18 100 %  11/14/16 1822 (!) 145/78 - - 69 16 99 %  11/14/16 1815 - - - 72 14 100 %  11/14/16 1808 (!) 139/52 - - 73 19 100 %  11/14/16 1800 - - - 71 16 100 %  11/14/16 1753 (!) 158/72 - - 76 19 98 %  11/14/16 1745 - - - 75 16 100 %  11/14/16 1738 (!) 140/54 - - 74 18 100 %  11/14/16 1730 - - - 75 14 100 %  11/14/16 1723 - 97.3 F (36.3 C) - 79 13 99 %  11/14/16 1722 (!) 160/99 - - 80 - 100 %  .  Recent laboratory studies: Ct Head Wo Contrast  Result Date: 11/15/2016 CLINICAL DATA:  Acute onset of altered mental status. Severe confusion. Recent fall. Initial encounter. EXAM: CT HEAD WITHOUT CONTRAST TECHNIQUE: Contiguous axial images were obtained from the base of the skull through the vertex without intravenous contrast. COMPARISON:  CT of the head performed 11/09/2016 FINDINGS: Brain: No evidence of acute infarction, hemorrhage,  hydrocephalus, extra-axial collection or mass lesion/mass effect. Evaluation is suboptimal due to motion artifact. Prominence of the ventricles and sulci reflects mild cortical volume loss. Mild periventricular white matter change likely reflects small vessel ischemic microangiopathy. The brainstem and fourth ventricle are within normal limits. The basal ganglia are unremarkable in appearance. The cerebral hemispheres demonstrate grossly normal gray-white differentiation. No mass effect or midline shift is seen. Vascular: No hyperdense vessel or unexpected calcification. Skull: There is no evidence of fracture; visualized osseous structures are unremarkable in appearance. Sinuses/Orbits: The visualized portions of the orbits are within normal limits. The paranasal sinuses and mastoid air cells are well-aerated. Other: No significant soft tissue abnormalities are seen. IMPRESSION: 1. No evidence of traumatic intracranial injury or fracture. Evaluation is suboptimal due to motion artifact. 2. Mild cortical volume loss and scattered small vessel ischemic microangiopathy. Electronically Signed   By: Garald Balding M.D.   On: 11/15/2016 01:00    Discharge Medications:   Allergies as of 11/15/2016      Reactions   Adhesive [tape] Other (See Comments)   Sensitive to adhesives, must be removed with adhesive remover due to thin skin.   Sulfamethoxazole-trimethoprim Hives      Medication List    TAKE these medications   ALPRAZolam 0.5 MG tablet Commonly known as:  XANAX Take 1 tablet (0.5 mg total) by mouth 3 (three) times daily as needed for anxiety.   aspirin EC 81 MG tablet Take 81 mg by mouth every 6 (six) hours as needed for moderate pain.   CALCIUM + D PO Take 1 tablet by mouth daily.   carvedilol 3.125 MG tablet Commonly known as:  COREG TAKE 1 TABLET BY MOUTH TWICE A DAY WITH A MEAL   CRANBERRY PO Take 2 capsules by mouth daily.   diphenoxylate-atropine 2.5-0.025 MG tablet Commonly known  as:  LOMOTIL Take 1 tablet by mouth 2 (two) times daily.   escitalopram 20 MG tablet Commonly known as:  LEXAPRO Take 20 mg by mouth 2 (two) times daily.   HYDROcodone-acetaminophen 5-325 MG tablet Commonly known as:  NORCO Take 1 tablet by mouth every 6 (six) hours as needed for moderate pain.   multivitamin tablet Take 1 tablet by mouth daily.   nitroGLYCERIN 0.4 MG SL tablet Commonly known as:  NITROSTAT Place 1 tablet (0.4 mg total) under the tongue every 5 (five) minutes as needed. For chest pain.  omeprazole 20 MG capsule Commonly known as:  PRILOSEC Take 20 mg by mouth daily.   oxyCODONE 5 MG immediate release tablet Commonly known as:  Oxy IR/ROXICODONE Take 1 tablet (5 mg total) by mouth every 4 (four) hours as needed for moderate pain.   polyethylene glycol packet Commonly known as:  MIRALAX / GLYCOLAX Take 17 g by mouth daily as needed for mild constipation.   PROBIOTIC DAILY PO Take 1 tablet by mouth daily.   STOOL SOFTENER PO Take 1 tablet by mouth daily as needed (constipation).   traMADol 50 MG tablet Commonly known as:  ULTRAM Take 1 tablet (50 mg total) by mouth 3 (three) times daily as needed.   trolamine salicylate 10 % cream Commonly known as:  ASPERCREME Apply 1 application topically as needed (for back pain).   UNABLE TO FIND Med Name: Med pass 120 mL by mouth 3 times daily   vitamin B-12 1000 MCG tablet Commonly known as:  CYANOCOBALAMIN Take 1,000 mcg by mouth daily.       Diagnostic Studies: Dg Ribs Unilateral W/chest Right  Result Date: 11/09/2016 CLINICAL DATA:  Fall at home this morning. Right-sided rib and chest pain. Initial encounter. EXAM: RIGHT RIBS AND CHEST - 3+ VIEW COMPARISON:  Chest radiograph on 11/21/2014 FINDINGS: No fracture or other bone lesions are seen involving the ribs. There is no evidence of pneumothorax or pleural effusion. Both lungs are clear. Heart size is normal. Stable ectasia of thoracic aorta. Bilateral  breast implants noted, as well as lumbar spine fusion hardware and previous upper lumbar vertebroplasty. IMPRESSION: No acute findings. Electronically Signed   By: Earle Gell M.D.   On: 11/09/2016 10:55   Dg Wrist Complete Right  Result Date: 11/09/2016 CLINICAL DATA:  Closed fracture distal radius postreduction EXAM: RIGHT WRIST - COMPLETE 3+ VIEW COMPARISON:  11/09/2016 FINDINGS: Three views of the right wrist submitted. Limited study by casting material artifact. Persistent comminuted fracture in distal right radius with dorsal angulation. No significant change in alignment from prior exam. IMPRESSION: Persistent comminuted fracture in distal right radius with dorsal angulation. No significant change in alignment from prior exam. Electronically Signed   By: Lahoma Crocker M.D.   On: 11/09/2016 17:01   Dg Wrist Complete Right  Result Date: 11/09/2016 CLINICAL DATA:  Postreduction films. EXAM: RIGHT WRIST - COMPLETE 3+ VIEW COMPARISON:  Earlier the same day FINDINGS: Fine bony detail obscured by the overlying plaster. Fracture the distal radial metaphysis again noted. Bony alignment similar to the pre reduction films. Bones appear demineralized. IMPRESSION: No substantial interval change status post closed reduction. Electronically Signed   By: Misty Stanley M.D.   On: 11/09/2016 14:35   Dg Wrist Complete Right  Result Date: 11/09/2016 CLINICAL DATA:  Fall at home this a.m landing onto rt side complains of rt sided chest pain, rt wrist pain with deformity EXAM: RIGHT WRIST - COMPLETE 3+ VIEW COMPARISON:  None. FINDINGS: Fracture of the distal radial metaphysis with dorsal angulation. Fracture does not appear to enter the articular surface. Radiocarpal joint is intact. Mild comminution of the fracture. Fracture of the ulnar styloid age-indeterminate. IMPRESSION: 1. Comminuted fracture of the distal radius with dorsal angulation. 2. Radiocarpal joint intact. 3. Age-indeterminate ulnar styloid fracture.  Electronically Signed   By: Suzy Bouchard M.D.   On: 11/09/2016 10:55   Ct Head Wo Contrast  Result Date: 11/15/2016 CLINICAL DATA:  Acute onset of altered mental status. Severe confusion. Recent fall. Initial encounter. EXAM: CT HEAD  WITHOUT CONTRAST TECHNIQUE: Contiguous axial images were obtained from the base of the skull through the vertex without intravenous contrast. COMPARISON:  CT of the head performed 11/09/2016 FINDINGS: Brain: No evidence of acute infarction, hemorrhage, hydrocephalus, extra-axial collection or mass lesion/mass effect. Evaluation is suboptimal due to motion artifact. Prominence of the ventricles and sulci reflects mild cortical volume loss. Mild periventricular white matter change likely reflects small vessel ischemic microangiopathy. The brainstem and fourth ventricle are within normal limits. The basal ganglia are unremarkable in appearance. The cerebral hemispheres demonstrate grossly normal gray-white differentiation. No mass effect or midline shift is seen. Vascular: No hyperdense vessel or unexpected calcification. Skull: There is no evidence of fracture; visualized osseous structures are unremarkable in appearance. Sinuses/Orbits: The visualized portions of the orbits are within normal limits. The paranasal sinuses and mastoid air cells are well-aerated. Other: No significant soft tissue abnormalities are seen. IMPRESSION: 1. No evidence of traumatic intracranial injury or fracture. Evaluation is suboptimal due to motion artifact. 2. Mild cortical volume loss and scattered small vessel ischemic microangiopathy. Electronically Signed   By: Garald Balding M.D.   On: 11/15/2016 01:00   Ct Head Wo Contrast  Result Date: 11/09/2016 CLINICAL DATA:  Patient presents to the emergency department after tripping over her cat food bowl this morning and landing on her right wrist and right lateral chest. She presents with obvious deformity of her right wrist. She denies head injury  or headache at this time. She denies neck pain. She denies recent illness. She is alert and oriented 4. She presents via EMS. She denies hip pain. Her only complaint is focused right wrist pain. She denies neck pain EXAM: CT HEAD WITHOUT CONTRAST CT CERVICAL SPINE WITHOUT CONTRAST TECHNIQUE: Multidetector CT imaging of the head and cervical spine was performed following the standard protocol without intravenous contrast. Multiplanar CT image reconstructions of the cervical spine were also generated. COMPARISON:  11/20/2014 FINDINGS: CT HEAD FINDINGS Brain: No evidence of acute infarction, hemorrhage, hydrocephalus, extra-axial collection or mass lesion/mass effect. Vascular: No hyperdense vessel or unexpected calcification. Skull: Normal. Negative for fracture or focal lesion. Sinuses/Orbits: No acute finding. Other: None. CT CERVICAL SPINE FINDINGS Alignment: Hyperextended but otherwise unremarkable with no spondylolisthesis. Skull base and vertebrae: No acute fracture. No primary bone lesion or focal pathologic process. Soft tissues and spinal canal: No prevertebral fluid or swelling. No visible canal hematoma. Disc levels: Mild to moderate loss disc height at C3-C4. Moderate loss disc height at C5-C6 and C6-C7. Endplate spurring at these levels. Bilateral facet degenerative change. Bones are demineralized. There are mild areas of disc bulging, but no convincing disc herniation. Upper chest: Apical pleuroparenchymal scarring.  No acute findings. Other: None. IMPRESSION: HEAD CT:  No acute intracranial abnormalities.  No skull fracture. CERVICAL CT:  No fracture or acute finding. Electronically Signed   By: Lajean Manes M.D.   On: 11/09/2016 17:54   Ct Cervical Spine Wo Contrast  Result Date: 11/09/2016 CLINICAL DATA:  Patient presents to the emergency department after tripping over her cat food bowl this morning and landing on her right wrist and right lateral chest. She presents with obvious deformity of  her right wrist. She denies head injury or headache at this time. She denies neck pain. She denies recent illness. She is alert and oriented 4. She presents via EMS. She denies hip pain. Her only complaint is focused right wrist pain. She denies neck pain EXAM: CT HEAD WITHOUT CONTRAST CT CERVICAL SPINE WITHOUT CONTRAST TECHNIQUE: Multidetector  CT imaging of the head and cervical spine was performed following the standard protocol without intravenous contrast. Multiplanar CT image reconstructions of the cervical spine were also generated. COMPARISON:  11/20/2014 FINDINGS: CT HEAD FINDINGS Brain: No evidence of acute infarction, hemorrhage, hydrocephalus, extra-axial collection or mass lesion/mass effect. Vascular: No hyperdense vessel or unexpected calcification. Skull: Normal. Negative for fracture or focal lesion. Sinuses/Orbits: No acute finding. Other: None. CT CERVICAL SPINE FINDINGS Alignment: Hyperextended but otherwise unremarkable with no spondylolisthesis. Skull base and vertebrae: No acute fracture. No primary bone lesion or focal pathologic process. Soft tissues and spinal canal: No prevertebral fluid or swelling. No visible canal hematoma. Disc levels: Mild to moderate loss disc height at C3-C4. Moderate loss disc height at C5-C6 and C6-C7. Endplate spurring at these levels. Bilateral facet degenerative change. Bones are demineralized. There are mild areas of disc bulging, but no convincing disc herniation. Upper chest: Apical pleuroparenchymal scarring.  No acute findings. Other: None. IMPRESSION: HEAD CT:  No acute intracranial abnormalities.  No skull fracture. CERVICAL CT:  No fracture or acute finding. Electronically Signed   By: Lajean Manes M.D.   On: 11/09/2016 17:54   Ct Angio Chest/abd/pel For Dissection W And/or Wo Contrast  Result Date: 11/09/2016 CLINICAL DATA:  Syncope with hypertension and hypoxia. Evaluate for aortic dissection. EXAM: CT ANGIOGRAPHY CHEST, ABDOMEN AND PELVIS  TECHNIQUE: Multidetector CT imaging through the chest, abdomen and pelvis was performed using the standard protocol during bolus administration of intravenous contrast. Multiplanar reconstructed images and MIPs were obtained and reviewed to evaluate the vascular anatomy. CONTRAST:  100 CC Isovue 370 COMPARISON:  None. FINDINGS: CTA CHEST FINDINGS Study is markedly motion degraded. Cardiovascular: Precontrast imaging shows no hyperdense crescent in the wall of the thoracic aorta to suggest acute intramural hematoma. Ascending thoracic aorta measures approximately 3.3 cm diameter. Within the limitation of 9 gated technique and substantial patient motion, no definite dissection flap is identified in the thoracic aorta. Heart size is normal. No pericardial effusion. No large central pulmonary embolus in the main pulmonary outflow tract or either main pulmonary artery. Mediastinum/Nodes: No mediastinal lymphadenopathy. There is no hilar lymphadenopathy. There is no axillary lymphadenopathy. Lungs/Pleura: No focal airspace consolidation. No overt pulmonary edema. No substantial pleural effusion. Given the extreme motion artifact, small pulmonary nodules could be obscured. Musculoskeletal: No gross lytic or sclerotic osseous abnormality is identified. Assessment of bony anatomy for fracture is markedly limited by the motion artifact. Review of the MIP images confirms the above findings. CTA ABDOMEN AND PELVIS FINDINGS VASCULAR Aorta: No thoracic aortic aneurysm. Celiac: Patent. Assessment for stenosis not possible due to motion artifact. SMA: Patent. Assessment for proximal stenosis not possible due to motion artifact. Renals: Patent bilaterally. Accessory left renal artery noted. Fine detail obscured by motion artifact. IMA: Not visualized but there is substantial motion artifact in this region. Veins: Portal vein and superior mesenteric vein are patent. IVC appears patent although assessment is degraded by streak  artifact from spinal fixation hardware and motion artifact. Review of the MIP images confirms the above findings. NON-VASCULAR Hepatobiliary: No gross abnormality in the liver parenchyma. There is no evidence for gallstones, gallbladder wall thickening, or pericholecystic fluid. No intrahepatic or extrahepatic biliary dilation. Pancreas: Limited assessment due to motion without focal abnormality identified. No dilatation of the main pancreatic duct. Spleen: Unremarkable. Adrenals/Urinary Tract: No adrenal nodule or mass. No hydronephrosis or gross renal mass evident. No evidence for hydroureter. Bladder is obscured by streak artifact from right hip replacement. Stomach/Bowel: Stomach  is nondistended. Duodenum not well evaluated. No evidence for small bowel obstruction. No evidence for colonic obstruction. Bowel extends into a right groin hernia but cannot be clearly discerned given streak artifact from the hip replacement and patient motion. Lymphatic: No gross lymphadenopathy can be identified in the abdomen or pelvis although right pelvic sidewall not well evaluated. Reproductive: Obscured. Other: No substantial intraperitoneal free fluid. Musculoskeletal: Diffuse demineralization. Right hip replacement. Old inferior right pubic ramus fracture. Status post lower lumbar fusion. Multilevel thoracolumbar compression fractures. Review of the MIP images confirms the above findings. IMPRESSION: 1. Study is markedly degraded by patient motion throughout image acquisition imaging through the pelvis further degraded by lumbar fusion hardware and right hip replacement. 2. Within the above-stated limitation, no definite dissection is identified in the thoraco abdominal aorta. There is no thoracoabdominal aortic aneurysm. 3. Right groin hernia contains bowel but is largely obscured by streak artifact and motion artifact. No findings to suggest bowel obstruction at this time. 4. Diffuse bony demineralization with multiple  thoracolumbar compression fractures. These are age-indeterminate and cannot be further evaluated given the marked motion degradation. Electronically Signed   By: Misty Stanley M.D.   On: 11/09/2016 21:25    They benefited maximally from their hospital stay and there were no complications.     Disposition: 03-Skilled Nursing Facility Discharge Instructions    Discharge patient    Complete by:  As directed    Discharge disposition:  03-Skilled [facility]   Discharge patient date:  11/15/2016     Follow-up [place], [place] In 2 weeks.   Specialty:  Orthopedic Surgery Contact information: [address] [place] [address] [phone]            Signed: Linna Hoff 11/15/2016, 3:51 PM

## 2016-11-16 DIAGNOSIS — S52571A Other intraarticular fracture of lower end of right radius, initial encounter for closed fracture: Secondary | ICD-10-CM | POA: Diagnosis not present

## 2016-11-16 LAB — CBC AND DIFFERENTIAL
HCT: 27 % — AB (ref 36–46)
HEMOGLOBIN: 9.1 g/dL — AB (ref 12.0–16.0)
Platelets: 354 10*3/uL (ref 150–399)
WBC: 10.7 10^3/mL

## 2016-11-16 LAB — HEPATIC FUNCTION PANEL
ALT: 12 U/L (ref 7–35)
AST: 17 U/L (ref 13–35)
Alkaline Phosphatase: 88 U/L (ref 25–125)
Bilirubin, Total: 1.1 mg/dL

## 2016-11-16 LAB — BASIC METABOLIC PANEL
BUN: 10 mg/dL (ref 4–21)
CREATININE: 0.6 mg/dL (ref 0.5–1.1)
Glucose: 115 mg/dL
POTASSIUM: 4.1 mmol/L (ref 3.4–5.3)
SODIUM: 140 mmol/L (ref 137–147)

## 2016-11-19 ENCOUNTER — Non-Acute Institutional Stay (SKILLED_NURSING_FACILITY): Payer: Medicare Other | Admitting: Internal Medicine

## 2016-11-19 ENCOUNTER — Encounter: Payer: Self-pay | Admitting: Internal Medicine

## 2016-11-19 DIAGNOSIS — S52501S Unspecified fracture of the lower end of right radius, sequela: Secondary | ICD-10-CM

## 2016-11-19 DIAGNOSIS — M25551 Pain in right hip: Secondary | ICD-10-CM | POA: Diagnosis not present

## 2016-11-19 DIAGNOSIS — E876 Hypokalemia: Secondary | ICD-10-CM

## 2016-11-19 DIAGNOSIS — R2681 Unsteadiness on feet: Secondary | ICD-10-CM | POA: Diagnosis not present

## 2016-11-19 DIAGNOSIS — I2510 Atherosclerotic heart disease of native coronary artery without angina pectoris: Secondary | ICD-10-CM | POA: Diagnosis not present

## 2016-11-19 DIAGNOSIS — F0390 Unspecified dementia without behavioral disturbance: Secondary | ICD-10-CM | POA: Diagnosis not present

## 2016-11-19 DIAGNOSIS — D500 Iron deficiency anemia secondary to blood loss (chronic): Secondary | ICD-10-CM | POA: Diagnosis not present

## 2016-11-19 NOTE — Progress Notes (Signed)
LOCATION: [facility]  PCP: Woody Seller, MD   Code Status: DNR  Goals of care: Advanced Directive information Advanced Directives 11/19/2016  Does Patient Have a Medical Advance Directive? -  Type of Advance Directive Out of facility DNR (pink MOST or yellow form);Healthcare Power of Attorney  Does patient want to make changes to medical advance directive? No - Patient declined  Copy of [place] in Chart? Yes  Would patient like information on creating a medical advance directive? -  Pre-existing out of facility DNR order (yellow form or pink MOST form) Yellow form placed in chart (order not valid for inpatient use)       Extended Emergency Contact Information Primary Emergency Contact: Taylor,Sheila Address: [place], [place] [address] [place] Phone: [phone] Relation: Daughter Secondary Emergency Contact: Gavin Pound [place] Mobile Phone: [phone] Relation: Grandson   Allergies  Allergen Reactions  . Adhesive [Tape] Other (See Comments)    Sensitive to adhesives, must be removed with adhesive remover due to thin skin.  Octaviano Glow Hives    Chief Complaint  Patient presents with  . Readmit To SNF    readmission     HPI:  Patient is a 82 y.o. female seen today for short term rehabilitation post hospital re-admission from 11/14/16-11/15/16 with displaced distal right radius fracture. She underwent open reduction and internal fixation. She was at this facility undergoing rehabilitation prior to this with displaced right distal radius fracture and hypoxia. She was awaiting surgery. She has PMH of CAD, HTN, dementia, anxiety, HLD, IBS and Takotsubo syndrome. She is seen in her room today.   Review of Systems:  Constitutional: Negative for fever, chills  HENT: Negative for headache, congestion, nasal discharge Eyes: Negative for double vision and  discharge.  Respiratory: Negative for cough, shortness of breath and wheezing.   Cardiovascular: Negative for chest pain, palpitations, leg swelling.  Gastrointestinal: Negative for heartburn, nausea, vomiting, abdominal pain, loss of appetite. She had a bowel movement this am.  Genitourinary: Negative for dysuria Musculoskeletal: positive for right hip pain. Pain medication has been helping.   Skin: Negative for itching, rash.  Neurological: Negative for dizziness.    Past Medical History:  Diagnosis Date  . Anemia   . Anxiety    confusion  . Blood transfusion    2005 maybe  . Coronary artery disease   . Depression   . Diverticular disease   . Esophageal reflux   . Falls frequently   . Headache(784.0)    takes Prilosec  . Hypertension   . IBS (irritable bowel syndrome)   . Osteoarthrosis, unspecified whether generalized or localized, lower leg   . Other and unspecified hyperlipidemia   . Shortness of breath    when my heart is not doing right  . Takotsubo syndrome   . Wrist fracture    Past Surgical History:  Procedure Laterality Date  . BACK SURGERY    . BREAST RECONSTRUCTION    . CATARACT EXTRACTION W/ INTRAOCULAR LENS  IMPLANT, BILATERAL    . FRACTURE SURGERY    . HIP ARTHROPLASTY Right 06/10/2013   Procedure: ARTHROPLASTY MONOPOLAR HIP CEMENTED;  Surgeon: Marybelle Killings, MD;  Location: [facility];  Service: Orthopedics;  Laterality: Right;  . HYSTEROTOMY    . IMPLANTABLE CONTACT LENS IMPLANTATION    . JOINT REPLACEMENT    . KNEE SURGERY    .  KYPHOPLASTY N/A 11/05/2013   Procedure: Jone Baseman TWO;  Surgeon: Ophelia Charter, MD;  Location: [facility];  Service: Neurosurgery;  Laterality: N/A;  . OPEN REDUCTION INTERNAL FIXATION (ORIF) DISTAL RADIAL FRACTURE Right 11/14/2016   Procedure: OPEN REDUCTION INTERNAL FIXATION (ORIF) DISTAL RADIAL FRACTURE, with repair as indicated;  Surgeon: Iran Planas, MD;  Location: [facility];  Service: Orthopedics;  Laterality:  Right;  . TONSILLECTOMY     as child   Social History:   reports that she has never smoked. She has never used smokeless tobacco. She reports that she does not drink alcohol or use drugs.  Family History  Problem Relation Age of Onset  . Heart failure Mother   . Stroke Sister   . Lung cancer Brother   . Lung cancer Other     Medications: Allergies as of 11/19/2016      Reactions   Adhesive [tape] Other (See Comments)   Sensitive to adhesives, must be removed with adhesive remover due to thin skin.   Sulfamethoxazole-trimethoprim Hives      Medication List       Accurate as of 11/19/16  3:40 PM. Always use your most recent med list.          acetaminophen 500 MG tablet Commonly known as:  TYLENOL Take 1,000 mg by mouth 3 (three) times daily.   ALPRAZolam 0.5 MG tablet Commonly known as:  XANAX Take 1 tablet (0.5 mg total) by mouth 3 (three) times daily as needed for anxiety.   aspirin 81 MG EC tablet Take 81 mg by mouth daily. Swallow whole.   CALCIUM + D PO Take 1 tablet by mouth daily.   carvedilol 3.125 MG tablet Commonly known as:  COREG TAKE 1 TABLET BY MOUTH TWICE A DAY WITH A MEAL   CRANBERRY PO Take 2 capsules by mouth daily.   diphenoxylate-atropine 2.5-0.025 MG tablet Commonly known as:  LOMOTIL Take 1 tablet by mouth 2 (two) times daily.   escitalopram 20 MG tablet Commonly known as:  LEXAPRO Take 20 mg by mouth 2 (two) times daily.   multivitamin tablet Take 1 tablet by mouth daily.   nitroGLYCERIN 0.4 MG SL tablet Commonly known as:  NITROSTAT Place 1 tablet (0.4 mg total) under the tongue every 5 (five) minutes as needed. For chest pain.   omeprazole 20 MG capsule Commonly known as:  PRILOSEC Take 20 mg by mouth daily.   oxyCODONE 5 MG immediate release tablet Commonly known as:  Oxy IR/ROXICODONE Take 1 tablet (5 mg total) by mouth every 4 (four) hours as needed for moderate pain.   polyethylene glycol packet Commonly known as:   MIRALAX / GLYCOLAX Take 17 g by mouth daily as needed for mild constipation.   PROBIOTIC DAILY PO Take 1 tablet by mouth daily.   STOOL SOFTENER PO Take 1 tablet by mouth daily as needed (constipation).   traMADol 50 MG tablet Commonly known as:  ULTRAM Take 25 mg by mouth 2 (two) times daily.   trolamine salicylate 10 % cream Commonly known as:  ASPERCREME Apply 1 application topically as needed (for back pain).   UNABLE TO FIND Med Name: Med pass 120 mL by mouth 3 times daily   vitamin B-12 1000 MCG tablet Commonly known as:  CYANOCOBALAMIN Take 1,000 mcg by mouth daily.       Immunizations: Immunization History  Administered Date(s) Administered  . PPD Test 11/11/2016  . Pneumococcal Polysaccharide-23 11/22/2014     Physical Exam: Vitals:  11/19/16 1235  BP: (!) 134/91  Pulse: 94  Resp: 20  Temp: 98.2 F (36.8 C)  TempSrc: Oral  SpO2: 96%  Weight: 109 lb (49.4 kg)  Height: 5\' 3"  (1.6 m)   Body mass index is 19.31 kg/m.  General- elderly female, frail and thin built, in no acute distress Head- normocephalic, atraumatic Nose- no nasal discharge Throat- moist mucus membrane, has dentures Eyes- PERRLA, EOMI, no pallor, no icterus, no discharge, normal conjunctiva, normal sclera Neck- no cervical lymphadenopathy Cardiovascular- normal s1,s2, no murmur Respiratory- bilateral clear to auscultation, no wheeze, no rhonchi, no crackles, no use of accessory muscles Abdomen- bowel sounds present, soft, non tender, no guarding or rigidity Musculoskeletal- cast to right forearm with sling, can move her fingers, able to move other extremities, trace leg edema Neurological- alert and oriented to person, place and time today Skin- warm and dry Psychiatry- normal mood and affect     Labs reviewed: Basic Metabolic Panel:  Recent Labs  11/09/16 2218  11/14/16 1401 11/14/16 1910 11/15/16 0923  NA  --   < > 138 139 135  K  --   < > 3.1* 3.6 3.1*  CL  --    < > 105 104 99*  CO2  --   < > 23 29 26   GLUCOSE  --   < > 119* 109* 152*  BUN  --   < > 14 12 6   CREATININE  --   < > 0.72 0.67 0.58  CALCIUM  --   < > 8.8* 8.5* 8.6*  MG 1.6*  --   --   --   --   < > = values in this interval not displayed. Liver Function Tests:  Recent Labs  11/14/16 1910 11/15/16 0923  AST 21 26  ALT 18 19  ALKPHOS 68 86  BILITOT 0.9 1.4*  PROT 5.6* 6.5  ALBUMIN 3.0* 3.3*   No results for input(s): LIPASE, AMYLASE in the last 8760 hours. No results for input(s): AMMONIA in the last 8760 hours. CBC:  Recent Labs  11/11/16 0819 11/14/16 1401 11/15/16 0719  WBC 7.8 7.5 9.1  HGB 8.6* 9.5* 8.4*  HCT 25.3* 27.1* 25.9*  MCV 91.7 90.3 92.5  PLT 183 287 287   Cardiac Enzymes:  Recent Labs  11/09/16 2218 11/10/16 0524 11/10/16 1044  TROPONINI <0.03 0.03* 0.03*   BNP: Invalid input(s): POCBNP CBG:  Recent Labs  11/10/16 0616 11/10/16 0737 11/11/16 0752  GLUCAP 124* 122* 100*    Radiological Exams: Dg Ribs Unilateral W/chest Right  Result Date: 11/09/2016 CLINICAL DATA:  Fall at home this morning. Right-sided rib and chest pain. Initial encounter. EXAM: RIGHT RIBS AND CHEST - 3+ VIEW COMPARISON:  Chest radiograph on 11/21/2014 FINDINGS: No fracture or other bone lesions are seen involving the ribs. There is no evidence of pneumothorax or pleural effusion. Both lungs are clear. Heart size is normal. Stable ectasia of thoracic aorta. Bilateral breast implants noted, as well as lumbar spine fusion hardware and previous upper lumbar vertebroplasty. IMPRESSION: No acute findings. Electronically Signed   By: Earle Gell M.D.   On: 11/09/2016 10:55   Dg Wrist Complete Right  Result Date: 11/09/2016 CLINICAL DATA:  Closed fracture distal radius postreduction EXAM: RIGHT WRIST - COMPLETE 3+ VIEW COMPARISON:  11/09/2016 FINDINGS: Three views of the right wrist submitted. Limited study by casting material artifact. Persistent comminuted fracture in distal  right radius with dorsal angulation. No significant change in alignment from prior exam. IMPRESSION:  Persistent comminuted fracture in distal right radius with dorsal angulation. No significant change in alignment from prior exam. Electronically Signed   By: Lahoma Crocker M.D.   On: 11/09/2016 17:01   Dg Wrist Complete Right  Result Date: 11/09/2016 CLINICAL DATA:  Postreduction films. EXAM: RIGHT WRIST - COMPLETE 3+ VIEW COMPARISON:  Earlier the same day FINDINGS: Fine bony detail obscured by the overlying plaster. Fracture the distal radial metaphysis again noted. Bony alignment similar to the pre reduction films. Bones appear demineralized. IMPRESSION: No substantial interval change status post closed reduction. Electronically Signed   By: Misty Stanley M.D.   On: 11/09/2016 14:35   Dg Wrist Complete Right  Result Date: 11/09/2016 CLINICAL DATA:  Fall at home this a.m landing onto rt side complains of rt sided chest pain, rt wrist pain with deformity EXAM: RIGHT WRIST - COMPLETE 3+ VIEW COMPARISON:  None. FINDINGS: Fracture of the distal radial metaphysis with dorsal angulation. Fracture does not appear to enter the articular surface. Radiocarpal joint is intact. Mild comminution of the fracture. Fracture of the ulnar styloid age-indeterminate. IMPRESSION: 1. Comminuted fracture of the distal radius with dorsal angulation. 2. Radiocarpal joint intact. 3. Age-indeterminate ulnar styloid fracture. Electronically Signed   By: Suzy Bouchard M.D.   On: 11/09/2016 10:55   Ct Head Wo Contrast  Result Date: 11/15/2016 CLINICAL DATA:  Acute onset of altered mental status. Severe confusion. Recent fall. Initial encounter. EXAM: CT HEAD WITHOUT CONTRAST TECHNIQUE: Contiguous axial images were obtained from the base of the skull through the vertex without intravenous contrast. COMPARISON:  CT of the head performed 11/09/2016 FINDINGS: Brain: No evidence of acute infarction, hemorrhage, hydrocephalus, extra-axial  collection or mass lesion/mass effect. Evaluation is suboptimal due to motion artifact. Prominence of the ventricles and sulci reflects mild cortical volume loss. Mild periventricular white matter change likely reflects small vessel ischemic microangiopathy. The brainstem and fourth ventricle are within normal limits. The basal ganglia are unremarkable in appearance. The cerebral hemispheres demonstrate grossly normal gray-white differentiation. No mass effect or midline shift is seen. Vascular: No hyperdense vessel or unexpected calcification. Skull: There is no evidence of fracture; visualized osseous structures are unremarkable in appearance. Sinuses/Orbits: The visualized portions of the orbits are within normal limits. The paranasal sinuses and mastoid air cells are well-aerated. Other: No significant soft tissue abnormalities are seen. IMPRESSION: 1. No evidence of traumatic intracranial injury or fracture. Evaluation is suboptimal due to motion artifact. 2. Mild cortical volume loss and scattered small vessel ischemic microangiopathy. Electronically Signed   By: Garald Balding M.D.   On: 11/15/2016 01:00   Ct Head Wo Contrast  Result Date: 11/09/2016 CLINICAL DATA:  Patient presents to the emergency department after tripping over her cat food bowl this morning and landing on her right wrist and right lateral chest. She presents with obvious deformity of her right wrist. She denies head injury or headache at this time. She denies neck pain. She denies recent illness. She is alert and oriented 4. She presents via EMS. She denies hip pain. Her only complaint is focused right wrist pain. She denies neck pain EXAM: CT HEAD WITHOUT CONTRAST CT CERVICAL SPINE WITHOUT CONTRAST TECHNIQUE: Multidetector CT imaging of the head and cervical spine was performed following the standard protocol without intravenous contrast. Multiplanar CT image reconstructions of the cervical spine were also generated. COMPARISON:   11/20/2014 FINDINGS: CT HEAD FINDINGS Brain: No evidence of acute infarction, hemorrhage, hydrocephalus, extra-axial collection or mass lesion/mass effect. Vascular: No hyperdense  vessel or unexpected calcification. Skull: Normal. Negative for fracture or focal lesion. Sinuses/Orbits: No acute finding. Other: None. CT CERVICAL SPINE FINDINGS Alignment: Hyperextended but otherwise unremarkable with no spondylolisthesis. Skull base and vertebrae: No acute fracture. No primary bone lesion or focal pathologic process. Soft tissues and spinal canal: No prevertebral fluid or swelling. No visible canal hematoma. Disc levels: Mild to moderate loss disc height at C3-C4. Moderate loss disc height at C5-C6 and C6-C7. Endplate spurring at these levels. Bilateral facet degenerative change. Bones are demineralized. There are mild areas of disc bulging, but no convincing disc herniation. Upper chest: Apical pleuroparenchymal scarring.  No acute findings. Other: None. IMPRESSION: HEAD CT:  No acute intracranial abnormalities.  No skull fracture. CERVICAL CT:  No fracture or acute finding. Electronically Signed   By: Lajean Manes M.D.   On: 11/09/2016 17:54   Ct Cervical Spine Wo Contrast  Result Date: 11/09/2016 CLINICAL DATA:  Patient presents to the emergency department after tripping over her cat food bowl this morning and landing on her right wrist and right lateral chest. She presents with obvious deformity of her right wrist. She denies head injury or headache at this time. She denies neck pain. She denies recent illness. She is alert and oriented 4. She presents via EMS. She denies hip pain. Her only complaint is focused right wrist pain. She denies neck pain EXAM: CT HEAD WITHOUT CONTRAST CT CERVICAL SPINE WITHOUT CONTRAST TECHNIQUE: Multidetector CT imaging of the head and cervical spine was performed following the standard protocol without intravenous contrast. Multiplanar CT image reconstructions of the cervical  spine were also generated. COMPARISON:  11/20/2014 FINDINGS: CT HEAD FINDINGS Brain: No evidence of acute infarction, hemorrhage, hydrocephalus, extra-axial collection or mass lesion/mass effect. Vascular: No hyperdense vessel or unexpected calcification. Skull: Normal. Negative for fracture or focal lesion. Sinuses/Orbits: No acute finding. Other: None. CT CERVICAL SPINE FINDINGS Alignment: Hyperextended but otherwise unremarkable with no spondylolisthesis. Skull base and vertebrae: No acute fracture. No primary bone lesion or focal pathologic process. Soft tissues and spinal canal: No prevertebral fluid or swelling. No visible canal hematoma. Disc levels: Mild to moderate loss disc height at C3-C4. Moderate loss disc height at C5-C6 and C6-C7. Endplate spurring at these levels. Bilateral facet degenerative change. Bones are demineralized. There are mild areas of disc bulging, but no convincing disc herniation. Upper chest: Apical pleuroparenchymal scarring.  No acute findings. Other: None. IMPRESSION: HEAD CT:  No acute intracranial abnormalities.  No skull fracture. CERVICAL CT:  No fracture or acute finding. Electronically Signed   By: Lajean Manes M.D.   On: 11/09/2016 17:54   Ct Angio Chest/abd/pel For Dissection W And/or Wo Contrast  Result Date: 11/09/2016 CLINICAL DATA:  Syncope with hypertension and hypoxia. Evaluate for aortic dissection. EXAM: CT ANGIOGRAPHY CHEST, ABDOMEN AND PELVIS TECHNIQUE: Multidetector CT imaging through the chest, abdomen and pelvis was performed using the standard protocol during bolus administration of intravenous contrast. Multiplanar reconstructed images and MIPs were obtained and reviewed to evaluate the vascular anatomy. CONTRAST:  100 CC Isovue 370 COMPARISON:  None. FINDINGS: CTA CHEST FINDINGS Study is markedly motion degraded. Cardiovascular: Precontrast imaging shows no hyperdense crescent in the wall of the thoracic aorta to suggest acute intramural hematoma.  Ascending thoracic aorta measures approximately 3.3 cm diameter. Within the limitation of 9 gated technique and substantial patient motion, no definite dissection flap is identified in the thoracic aorta. Heart size is normal. No pericardial effusion. No large central pulmonary embolus in the main  pulmonary outflow tract or either main pulmonary artery. Mediastinum/Nodes: No mediastinal lymphadenopathy. There is no hilar lymphadenopathy. There is no axillary lymphadenopathy. Lungs/Pleura: No focal airspace consolidation. No overt pulmonary edema. No substantial pleural effusion. Given the extreme motion artifact, small pulmonary nodules could be obscured. Musculoskeletal: No gross lytic or sclerotic osseous abnormality is identified. Assessment of bony anatomy for fracture is markedly limited by the motion artifact. Review of the MIP images confirms the above findings. CTA ABDOMEN AND PELVIS FINDINGS VASCULAR Aorta: No thoracic aortic aneurysm. Celiac: Patent. Assessment for stenosis not possible due to motion artifact. SMA: Patent. Assessment for proximal stenosis not possible due to motion artifact. Renals: Patent bilaterally. Accessory left renal artery noted. Fine detail obscured by motion artifact. IMA: Not visualized but there is substantial motion artifact in this region. Veins: Portal vein and superior mesenteric vein are patent. IVC appears patent although assessment is degraded by streak artifact from spinal fixation hardware and motion artifact. Review of the MIP images confirms the above findings. NON-VASCULAR Hepatobiliary: No gross abnormality in the liver parenchyma. There is no evidence for gallstones, gallbladder wall thickening, or pericholecystic fluid. No intrahepatic or extrahepatic biliary dilation. Pancreas: Limited assessment due to motion without focal abnormality identified. No dilatation of the main pancreatic duct. Spleen: Unremarkable. Adrenals/Urinary Tract: No adrenal nodule or mass.  No hydronephrosis or gross renal mass evident. No evidence for hydroureter. Bladder is obscured by streak artifact from right hip replacement. Stomach/Bowel: Stomach is nondistended. Duodenum not well evaluated. No evidence for small bowel obstruction. No evidence for colonic obstruction. Bowel extends into a right groin hernia but cannot be clearly discerned given streak artifact from the hip replacement and patient motion. Lymphatic: No gross lymphadenopathy can be identified in the abdomen or pelvis although right pelvic sidewall not well evaluated. Reproductive: Obscured. Other: No substantial intraperitoneal free fluid. Musculoskeletal: Diffuse demineralization. Right hip replacement. Old inferior right pubic ramus fracture. Status post lower lumbar fusion. Multilevel thoracolumbar compression fractures. Review of the MIP images confirms the above findings. IMPRESSION: 1. Study is markedly degraded by patient motion throughout image acquisition imaging through the pelvis further degraded by lumbar fusion hardware and right hip replacement. 2. Within the above-stated limitation, no definite dissection is identified in the thoraco abdominal aorta. There is no thoracoabdominal aortic aneurysm. 3. Right groin hernia contains bowel but is largely obscured by streak artifact and motion artifact. No findings to suggest bowel obstruction at this time. 4. Diffuse bony demineralization with multiple thoracolumbar compression fractures. These are age-indeterminate and cannot be further evaluated given the marked motion degradation. Electronically Signed   By: Misty Stanley M.D.   On: 11/09/2016 21:25    Assessment/Plan  Unsteady gait With right leg pain. Will have patient work with PT/OT as tolerated to regain strength and restore function.  Fall precautions are in place.  Right distal radius fracture s/p ORIF. f/u with orthopedic. Currently on oxyIR 5 mg q4h prn pain and tramadol 25 mg bid with tylenol. PMR  consult. Orthopedic f/u. NWB to RUE for now. Will have patient work with PT/OT as tolerated to regain strength and restore function.  Fall precautions are in place.  Right hip pain PMR consult. Continue pain med as above. Fall precautions  Hypokalemia Check bmp  Blood loss anemia Post op, check cbc  Dementia Provide supportive care. Fall precautions, aspiration precaution and pressure ulcer prophylaxis  CAD On carvedilol. continue aspirin ec 81 mg daily for now.    Goals of care: short term rehabilitation  Labs/tests ordered: cbc, bmp 11/20/16  Family/ staff Communication: reviewed care plan with patient and nursing supervisor    Blanchie Serve, MD Internal Medicine [place], [place] [address] Cell Phone (Monday-Friday 8 am - 5 pm): [phone] On Call: [phone] and follow prompts after 5 pm and on weekends Office Phone: [phone] Office Fax: [phone]

## 2016-11-20 ENCOUNTER — Encounter: Payer: Self-pay | Admitting: Family

## 2016-11-20 ENCOUNTER — Non-Acute Institutional Stay (SKILLED_NURSING_FACILITY): Payer: Medicare Other | Admitting: Family

## 2016-11-20 DIAGNOSIS — E8809 Other disorders of plasma-protein metabolism, not elsewhere classified: Secondary | ICD-10-CM | POA: Diagnosis not present

## 2016-11-20 DIAGNOSIS — E440 Moderate protein-calorie malnutrition: Secondary | ICD-10-CM

## 2016-11-20 DIAGNOSIS — D509 Iron deficiency anemia, unspecified: Secondary | ICD-10-CM

## 2016-11-20 DIAGNOSIS — R410 Disorientation, unspecified: Secondary | ICD-10-CM

## 2016-11-20 NOTE — Anesthesia Postprocedure Evaluation (Signed)
Anesthesia Post Note  Patient: [name]  Procedure(s) Performed: Procedure(s) (LRB): OPEN REDUCTION INTERNAL FIXATION (ORIF) DISTAL RADIAL FRACTURE, with repair as indicated (Right)  Patient location during evaluation: PACU Anesthesia Type: MAC Level of consciousness: awake and alert Pain management: pain level controlled Vital Signs Assessment: post-procedure vital signs reviewed and stable Respiratory status: spontaneous breathing, nonlabored ventilation, respiratory function stable and patient connected to nasal cannula oxygen Cardiovascular status: stable and blood pressure returned to baseline Anesthetic complications: no        Last Vitals:  Vitals:   11/15/16 1358 11/15/16 2100  BP: 126/62 138/78  Pulse: 71 79  Resp: 16 18  Temp: 37.2 C 37.2 C    Last Pain:  Vitals:   11/15/16 2100  TempSrc: Oral  PainSc:    Pain Goal: Patients Stated Pain Goal: 3 (11/15/16 2104)               Lyndle Herrlich EDWARD

## 2016-11-20 NOTE — Progress Notes (Signed)
Location:  [place] Room Number: [place] of Service:  SNF [phone]) Provider: Ayo Guarino FNP-C  Woody Seller, MD  Patient Care Team: Christain Sacramento, MD as PCP - General (Family Medicine) Sherren Mocha, MD as Consulting Physician (Cardiology)  Extended Emergency Contact Information Primary Emergency Contact: Taylor,Sheila Address: [place], [place] [address] [address] Phone: [phone] Relation: Daughter Secondary Emergency Contact: Gavin Pound [place] of [place] Mobile Phone: [phone] Relation: Grandson  Code Status:  DNR  Goals of care: Advanced Directive information Advanced Directives 11/20/2016  Does Patient Have a Medical Advance Directive? Yes  Type of Advance Directive Out of facility DNR (pink MOST or yellow form);Healthcare Power of Attorney  Does patient want to make changes to medical advance directive? No - Patient declined  Copy of [place] in Chart? Yes  Would patient like information on creating a medical advance directive? -  Pre-existing out of facility DNR order (yellow form or pink MOST form) Yellow form placed in chart (order not valid for inpatient use)     Chief Complaint  Patient presents with  . Acute Visit    Abnormal Labs    HPI:  Pt is a 82 y.o. female seen today at [facility] for an acute visit for evaluation of abnormal lab results. She is seen in her room today. She denies any acute issues this visit. Facility Nurse reports patient has been refusing to take her  Medication. Patient states " they're out to kill me and my son is working with them". She further states they're forcing her to undergo surgery " so  They can kill me". Her recent lab results showed WBC 10.7,Hgb 9.1, HCT 26.5, TP 5.7, ALB 3.32 ( 11/16/2016). Of note she is status post right ORIF distal radial fracture.    Past Medical History:    Diagnosis Date  . Anemia   . Anxiety    confusion  . Blood transfusion    2005 maybe  . Coronary artery disease   . Depression   . Diverticular disease   . Esophageal reflux   . Falls frequently   . Headache(784.0)    takes Prilosec  . Hypertension   . IBS (irritable bowel syndrome)   . Osteoarthrosis, unspecified whether generalized or localized, lower leg   . Other and unspecified hyperlipidemia   . Shortness of breath    when my heart is not doing right  . Takotsubo syndrome   . Wrist fracture    Past Surgical History:  Procedure Laterality Date  . BACK SURGERY    . BREAST RECONSTRUCTION    . CATARACT EXTRACTION W/ INTRAOCULAR LENS  IMPLANT, BILATERAL    . FRACTURE SURGERY    . HIP ARTHROPLASTY Right 06/10/2013   Procedure: ARTHROPLASTY MONOPOLAR HIP CEMENTED;  Surgeon: Marybelle Killings, MD;  Location: [facility]S;  Service: Orthopedics;  Laterality: Right;  . HYSTEROTOMY    . IMPLANTABLE CONTACT LENS IMPLANTATION    . JOINT REPLACEMENT    . KNEE SURGERY    . KYPHOPLASTY N/A 11/05/2013   Procedure: Jone Baseman TWO;  Surgeon: Ophelia Charter, MD;  Location: [place] [facility];  Service: Neurosurgery;  Laterality: N/A;  . OPEN REDUCTION INTERNAL FIXATION (ORIF) DISTAL RADIAL FRACTURE Right 11/14/2016   Procedure: OPEN REDUCTION INTERNAL FIXATION (ORIF) DISTAL RADIAL FRACTURE, with repair as indicated;  Surgeon: Iran Planas, MD;  Location: [place];  Service: Orthopedics;  Laterality: Right;  . TONSILLECTOMY     as child    Allergies  Allergen Reactions  . Adhesive [Tape] Other (See Comments)    Sensitive to adhesives, must be removed with adhesive remover due to thin skin.  . Sulfamethoxazole-Trimethoprim Hives    Allergies as of 11/20/2016      Reactions   Adhesive [tape] Other (See Comments)   Sensitive to adhesives, must be removed with adhesive remover due to thin skin.   Sulfamethoxazole-trimethoprim Hives      Medication List       Accurate as of 11/20/16  2:24  PM. Always use your most recent med list.          acetaminophen 500 MG tablet Commonly known as:  TYLENOL Take 1,000 mg by mouth 3 (three) times daily.   ALPRAZolam 0.5 MG tablet Commonly known as:  XANAX Take 1 tablet (0.5 mg total) by mouth 3 (three) times daily as needed for anxiety.   aspirin 81 MG EC tablet Take 81 mg by mouth daily. Swallow whole.   CALCIUM + D PO Take 1 tablet by mouth daily.   carvedilol 3.125 MG tablet Commonly known as:  COREG TAKE 1 TABLET BY MOUTH TWICE A DAY WITH A MEAL   CRANBERRY PO Take 2 capsules by mouth daily.   diphenoxylate-atropine 2.5-0.025 MG tablet Commonly known as:  LOMOTIL Take 1 tablet by mouth 2 (two) times daily.   escitalopram 20 MG tablet Commonly known as:  LEXAPRO Take 20 mg by mouth 2 (two) times daily.   multivitamin tablet Take 1 tablet by mouth daily.   nitroGLYCERIN 0.4 MG SL tablet Commonly known as:  NITROSTAT Place 1 tablet (0.4 mg total) under the tongue every 5 (five) minutes as needed. For chest pain.   omeprazole 20 MG capsule Commonly known as:  PRILOSEC Take 20 mg by mouth daily.   oxyCODONE 5 MG immediate release tablet Commonly known as:  Oxy IR/ROXICODONE Take 1 tablet (5 mg total) by mouth every 4 (four) hours as needed for moderate pain.   polyethylene glycol packet Commonly known as:  MIRALAX / GLYCOLAX Take 17 g by mouth daily as needed for mild constipation.   PROBIOTIC DAILY PO Take 1 tablet by mouth daily.   STOOL SOFTENER PO Take 1 tablet by mouth daily as needed (constipation).   traMADol 50 MG tablet Commonly known as:  ULTRAM Take 25 mg by mouth 2 (two) times daily.   trolamine salicylate 10 % cream Commonly known as:  ASPERCREME Apply 1 application topically as needed (for back pain).   UNABLE TO FIND Med Name: Med pass 120 mL by mouth 3 times daily   vitamin B-12 1000 MCG tablet Commonly known as:  CYANOCOBALAMIN Take 1,000 mcg by mouth daily.       Review of  Systems  Constitutional: Negative for activity change, appetite change, chills, fatigue and fever.  HENT: Negative for congestion, rhinorrhea, sinus pain, sinus pressure, sneezing and sore throat.   Eyes: Negative.   Respiratory: Negative for cough, chest tightness, shortness of breath and wheezing.   Cardiovascular: Negative for chest pain, palpitations and leg swelling.  Gastrointestinal: Negative for abdominal distention, abdominal pain, constipation, diarrhea, nausea and vomiting.  Genitourinary: Negative for dysuria, flank pain and urgency.  Musculoskeletal: Positive for gait problem.       Right arm pain under control   Skin: Negative for color change, pallor and rash.  Neurological: Negative for  dizziness, seizures, light-headedness and headaches.  Psychiatric/Behavioral: Positive for confusion. Negative for agitation, hallucinations and sleep disturbance. The patient is not nervous/anxious.     Immunization History  Administered Date(s) Administered  . PPD Test 11/11/2016  . Pneumococcal Polysaccharide-23 11/22/2014   Pertinent  Health Maintenance Due  Topic Date Due  . PNA vac Low Risk Adult (2 of 2 - PCV13) 11/22/2015  . INFLUENZA VACCINE  02/20/2017  . DEXA SCAN  Completed    Vitals:   11/20/16 1141  BP: 134/86  Pulse: 98  Resp: 18  Temp: (!) 96.8 F (36 C)  TempSrc: Oral  SpO2: 92%  Weight: 109 lb (49.4 kg)  Height: 5\' 3"  (1.6 m)   Body mass index is 19.31 kg/m. Physical Exam  Constitutional:  Thin frail elderly in no acute distress   HENT:  Head: Normocephalic.  Mouth/Throat: Oropharynx is clear and moist. No oropharyngeal exudate.  Eyes: Conjunctivae and EOM are normal. Pupils are equal, round, and reactive to light. Right eye exhibits no discharge. Left eye exhibits no discharge. No scleral icterus.  Neck: Normal range of motion. No JVD present. No thyromegaly present.  Cardiovascular: Normal rate, regular rhythm and intact distal pulses.  Exam reveals  no gallop and no friction rub.   No murmur heard. Pulmonary/Chest: Effort normal and breath sounds normal. No respiratory distress. She has no wheezes. She has no rales.  Abdominal: Soft. Bowel sounds are normal. She exhibits no distension. There is no tenderness. There is no rebound and no guarding.  Musculoskeletal: She exhibits no edema or tenderness.  Unsteady gait. RUE cast in place.   Lymphadenopathy:    She has no cervical adenopathy.  Neurological:  Alert with intermittent confusion   Skin: Skin is warm and dry. No rash noted. No erythema. No pallor.  Right forearm cast dry, clean and intact able to move fingers without any difficulties.   Psychiatric: She has a normal mood and affect.    Labs reviewed:  Recent Labs  11/09/16 2218  11/14/16 1401 11/14/16 1910 11/15/16 0923 11/16/16  NA  --   < > 138 139 135 140  K  --   < > 3.1* 3.6 3.1* 4.1  CL  --   < > 105 104 99*  --   CO2  --   < > 23 29 26   --   GLUCOSE  --   < > 119* 109* 152*  --   BUN  --   < > 14 12 6 10   CREATININE  --   < > 0.72 0.67 0.58 0.6  CALCIUM  --   < > 8.8* 8.5* 8.6*  --   MG 1.6*  --   --   --   --   --   < > = values in this interval not displayed.  Recent Labs  11/14/16 1910 11/15/16 0923 11/16/16  AST 21 26 17   ALT 18 19 12   ALKPHOS 68 86 88  BILITOT 0.9 1.4*  --   PROT 5.6* 6.5  --   ALBUMIN 3.0* 3.3*  --     Recent Labs  11/11/16 0819 11/14/16 1401 11/15/16 0719 11/16/16  WBC 7.8 7.5 9.1 10.7  HGB 8.6* 9.5* 8.4* 9.1*  HCT 25.3* 27.1* 25.9* 27*  MCV 91.7 90.3 92.5  --   PLT 183 287 287 354   Lab Results  Component Value Date   TSH 2.539 11/14/2016   Lab Results  Component Value Date   HGBA1C 5.2  11/10/2016   Lab Results  Component Value Date   CHOL 167 02/15/2011   HDL 59 02/15/2011   LDLCALC 92 02/15/2011   TRIG 82 02/15/2011   CHOLHDL 2.8 02/15/2011    Significant Diagnostic Results in last 30 days:  Dg Ribs Unilateral W/chest Right  Result Date:  11/09/2016 CLINICAL DATA:  Fall at home this morning. Right-sided rib and chest pain. Initial encounter. EXAM: RIGHT RIBS AND CHEST - 3+ VIEW COMPARISON:  Chest radiograph on 11/21/2014 FINDINGS: No fracture or other bone lesions are seen involving the ribs. There is no evidence of pneumothorax or pleural effusion. Both lungs are clear. Heart size is normal. Stable ectasia of thoracic aorta. Bilateral breast implants noted, as well as lumbar spine fusion hardware and previous upper lumbar vertebroplasty. IMPRESSION: No acute findings. Electronically Signed   By: Earle Gell M.D.   On: 11/09/2016 10:55   Dg Wrist Complete Right  Result Date: 11/09/2016 CLINICAL DATA:  Closed fracture distal radius postreduction EXAM: RIGHT WRIST - COMPLETE 3+ VIEW COMPARISON:  11/09/2016 FINDINGS: Three views of the right wrist submitted. Limited study by casting material artifact. Persistent comminuted fracture in distal right radius with dorsal angulation. No significant change in alignment from prior exam. IMPRESSION: Persistent comminuted fracture in distal right radius with dorsal angulation. No significant change in alignment from prior exam. Electronically Signed   By: Lahoma Crocker M.D.   On: 11/09/2016 17:01   Dg Wrist Complete Right  Result Date: 11/09/2016 CLINICAL DATA:  Postreduction films. EXAM: RIGHT WRIST - COMPLETE 3+ VIEW COMPARISON:  Earlier the same day FINDINGS: Fine bony detail obscured by the overlying plaster. Fracture the distal radial metaphysis again noted. Bony alignment similar to the pre reduction films. Bones appear demineralized. IMPRESSION: No substantial interval change status post closed reduction. Electronically Signed   By: Misty Stanley M.D.   On: 11/09/2016 14:35   Dg Wrist Complete Right  Result Date: 11/09/2016 CLINICAL DATA:  Fall at home this a.m landing onto rt side complains of rt sided chest pain, rt wrist pain with deformity EXAM: RIGHT WRIST - COMPLETE 3+ VIEW COMPARISON:   None. FINDINGS: Fracture of the distal radial metaphysis with dorsal angulation. Fracture does not appear to enter the articular surface. Radiocarpal joint is intact. Mild comminution of the fracture. Fracture of the ulnar styloid age-indeterminate. IMPRESSION: 1. Comminuted fracture of the distal radius with dorsal angulation. 2. Radiocarpal joint intact. 3. Age-indeterminate ulnar styloid fracture. Electronically Signed   By: Suzy Bouchard M.D.   On: 11/09/2016 10:55   Ct Head Wo Contrast  Result Date: 11/15/2016 CLINICAL DATA:  Acute onset of altered mental status. Severe confusion. Recent fall. Initial encounter. EXAM: CT HEAD WITHOUT CONTRAST TECHNIQUE: Contiguous axial images were obtained from the base of the skull through the vertex without intravenous contrast. COMPARISON:  CT of the head performed 11/09/2016 FINDINGS: Brain: No evidence of acute infarction, hemorrhage, hydrocephalus, extra-axial collection or mass lesion/mass effect. Evaluation is suboptimal due to motion artifact. Prominence of the ventricles and sulci reflects mild cortical volume loss. Mild periventricular white matter change likely reflects small vessel ischemic microangiopathy. The brainstem and fourth ventricle are within normal limits. The basal ganglia are unremarkable in appearance. The cerebral hemispheres demonstrate grossly normal gray-white differentiation. No mass effect or midline shift is seen. Vascular: No hyperdense vessel or unexpected calcification. Skull: There is no evidence of fracture; visualized osseous structures are unremarkable in appearance. Sinuses/Orbits: The visualized portions of the orbits are within normal limits. The  paranasal sinuses and mastoid air cells are well-aerated. Other: No significant soft tissue abnormalities are seen. IMPRESSION: 1. No evidence of traumatic intracranial injury or fracture. Evaluation is suboptimal due to motion artifact. 2. Mild cortical volume loss and scattered  small vessel ischemic microangiopathy. Electronically Signed   By: Garald Balding M.D.   On: 11/15/2016 01:00   Ct Head Wo Contrast  Result Date: 11/09/2016 CLINICAL DATA:  Patient presents to the emergency department after tripping over her cat food bowl this morning and landing on her right wrist and right lateral chest. She presents with obvious deformity of her right wrist. She denies head injury or headache at this time. She denies neck pain. She denies recent illness. She is alert and oriented 4. She presents via EMS. She denies hip pain. Her only complaint is focused right wrist pain. She denies neck pain EXAM: CT HEAD WITHOUT CONTRAST CT CERVICAL SPINE WITHOUT CONTRAST TECHNIQUE: Multidetector CT imaging of the head and cervical spine was performed following the standard protocol without intravenous contrast. Multiplanar CT image reconstructions of the cervical spine were also generated. COMPARISON:  11/20/2014 FINDINGS: CT HEAD FINDINGS Brain: No evidence of acute infarction, hemorrhage, hydrocephalus, extra-axial collection or mass lesion/mass effect. Vascular: No hyperdense vessel or unexpected calcification. Skull: Normal. Negative for fracture or focal lesion. Sinuses/Orbits: No acute finding. Other: None. CT CERVICAL SPINE FINDINGS Alignment: Hyperextended but otherwise unremarkable with no spondylolisthesis. Skull base and vertebrae: No acute fracture. No primary bone lesion or focal pathologic process. Soft tissues and spinal canal: No prevertebral fluid or swelling. No visible canal hematoma. Disc levels: Mild to moderate loss disc height at C3-C4. Moderate loss disc height at C5-C6 and C6-C7. Endplate spurring at these levels. Bilateral facet degenerative change. Bones are demineralized. There are mild areas of disc bulging, but no convincing disc herniation. Upper chest: Apical pleuroparenchymal scarring.  No acute findings. Other: None. IMPRESSION: HEAD CT:  No acute intracranial  abnormalities.  No skull fracture. CERVICAL CT:  No fracture or acute finding. Electronically Signed   By: Lajean Manes M.D.   On: 11/09/2016 17:54   Ct Cervical Spine Wo Contrast  Result Date: 11/09/2016 CLINICAL DATA:  Patient presents to the emergency department after tripping over her cat food bowl this morning and landing on her right wrist and right lateral chest. She presents with obvious deformity of her right wrist. She denies head injury or headache at this time. She denies neck pain. She denies recent illness. She is alert and oriented 4. She presents via EMS. She denies hip pain. Her only complaint is focused right wrist pain. She denies neck pain EXAM: CT HEAD WITHOUT CONTRAST CT CERVICAL SPINE WITHOUT CONTRAST TECHNIQUE: Multidetector CT imaging of the head and cervical spine was performed following the standard protocol without intravenous contrast. Multiplanar CT image reconstructions of the cervical spine were also generated. COMPARISON:  11/20/2014 FINDINGS: CT HEAD FINDINGS Brain: No evidence of acute infarction, hemorrhage, hydrocephalus, extra-axial collection or mass lesion/mass effect. Vascular: No hyperdense vessel or unexpected calcification. Skull: Normal. Negative for fracture or focal lesion. Sinuses/Orbits: No acute finding. Other: None. CT CERVICAL SPINE FINDINGS Alignment: Hyperextended but otherwise unremarkable with no spondylolisthesis. Skull base and vertebrae: No acute fracture. No primary bone lesion or focal pathologic process. Soft tissues and spinal canal: No prevertebral fluid or swelling. No visible canal hematoma. Disc levels: Mild to moderate loss disc height at C3-C4. Moderate loss disc height at C5-C6 and C6-C7. Endplate spurring at these levels. Bilateral facet degenerative  change. Bones are demineralized. There are mild areas of disc bulging, but no convincing disc herniation. Upper chest: Apical pleuroparenchymal scarring.  No acute findings. Other: None.  IMPRESSION: HEAD CT:  No acute intracranial abnormalities.  No skull fracture. CERVICAL CT:  No fracture or acute finding. Electronically Signed   By: Lajean Manes M.D.   On: 11/09/2016 17:54   Ct Angio Chest/abd/pel For Dissection W And/or Wo Contrast  Result Date: 11/09/2016 CLINICAL DATA:  Syncope with hypertension and hypoxia. Evaluate for aortic dissection. EXAM: CT ANGIOGRAPHY CHEST, ABDOMEN AND PELVIS TECHNIQUE: Multidetector CT imaging through the chest, abdomen and pelvis was performed using the standard protocol during bolus administration of intravenous contrast. Multiplanar reconstructed images and MIPs were obtained and reviewed to evaluate the vascular anatomy. CONTRAST:  100 CC Isovue 370 COMPARISON:  None. FINDINGS: CTA CHEST FINDINGS Study is markedly motion degraded. Cardiovascular: Precontrast imaging shows no hyperdense crescent in the wall of the thoracic aorta to suggest acute intramural hematoma. Ascending thoracic aorta measures approximately 3.3 cm diameter. Within the limitation of 9 gated technique and substantial patient motion, no definite dissection flap is identified in the thoracic aorta. Heart size is normal. No pericardial effusion. No large central pulmonary embolus in the main pulmonary outflow tract or either main pulmonary artery. Mediastinum/Nodes: No mediastinal lymphadenopathy. There is no hilar lymphadenopathy. There is no axillary lymphadenopathy. Lungs/Pleura: No focal airspace consolidation. No overt pulmonary edema. No substantial pleural effusion. Given the extreme motion artifact, small pulmonary nodules could be obscured. Musculoskeletal: No gross lytic or sclerotic osseous abnormality is identified. Assessment of bony anatomy for fracture is markedly limited by the motion artifact. Review of the MIP images confirms the above findings. CTA ABDOMEN AND PELVIS FINDINGS VASCULAR Aorta: No thoracic aortic aneurysm. Celiac: Patent. Assessment for stenosis not possible  due to motion artifact. SMA: Patent. Assessment for proximal stenosis not possible due to motion artifact. Renals: Patent bilaterally. Accessory left renal artery noted. Fine detail obscured by motion artifact. IMA: Not visualized but there is substantial motion artifact in this region. Veins: Portal vein and superior mesenteric vein are patent. IVC appears patent although assessment is degraded by streak artifact from spinal fixation hardware and motion artifact. Review of the MIP images confirms the above findings. NON-VASCULAR Hepatobiliary: No gross abnormality in the liver parenchyma. There is no evidence for gallstones, gallbladder wall thickening, or pericholecystic fluid. No intrahepatic or extrahepatic biliary dilation. Pancreas: Limited assessment due to motion without focal abnormality identified. No dilatation of the main pancreatic duct. Spleen: Unremarkable. Adrenals/Urinary Tract: No adrenal nodule or mass. No hydronephrosis or gross renal mass evident. No evidence for hydroureter. Bladder is obscured by streak artifact from right hip replacement. Stomach/Bowel: Stomach is nondistended. Duodenum not well evaluated. No evidence for small bowel obstruction. No evidence for colonic obstruction. Bowel extends into a right groin hernia but cannot be clearly discerned given streak artifact from the hip replacement and patient motion. Lymphatic: No gross lymphadenopathy can be identified in the abdomen or pelvis although right pelvic sidewall not well evaluated. Reproductive: Obscured. Other: No substantial intraperitoneal free fluid. Musculoskeletal: Diffuse demineralization. Right hip replacement. Old inferior right pubic ramus fracture. Status post lower lumbar fusion. Multilevel thoracolumbar compression fractures. Review of the MIP images confirms the above findings. IMPRESSION: 1. Study is markedly degraded by patient motion throughout image acquisition imaging through the pelvis further degraded by  lumbar fusion hardware and right hip replacement. 2. Within the above-stated limitation, no definite dissection is identified in the thoraco abdominal  aorta. There is no thoracoabdominal aortic aneurysm. 3. Right groin hernia contains bowel but is largely obscured by streak artifact and motion artifact. No findings to suggest bowel obstruction at this time. 4. Diffuse bony demineralization with multiple thoracolumbar compression fractures. These are age-indeterminate and cannot be further evaluated given the marked motion degradation. Electronically Signed   By: Misty Stanley M.D.   On: 11/09/2016 21:25   Assessment/Plan 1. Moderate protein-calorie malnutrition (HCC) TP 5.7 ( 11/16/2016). Will consult registered Dietician for supplements.continue to monitor.    2. Hypoalbuminemia  ALB 3.32 ( 11/16/2016). RD consults for supplements.    3. Iron deficiency anemia Hgb 9.1, HCT 26.5 ( 11/16/2016). Has improved previous Hgb 8.4. She is   status post right ORIF distal radial fracture.continue to monitor CBC.    4. Intermittent confusion Afebrile.has been refusing her medications thinks Nurse and son wants to kill her.Obtain urine specimen for U/A and C/S rule out UTI.   Family/ staff Communication: Reviewed plan of care with patient and facility Nurse supervisor  Labs/tests ordered: urine specimen for U/A and C/S rule out UTI.   Sandrea Hughs, NP

## 2016-11-21 LAB — CBC AND DIFFERENTIAL
HEMATOCRIT: 28 % — AB (ref 36–46)
Hemoglobin: 9.1 g/dL — AB (ref 12.0–16.0)
PLATELETS: 518 10*3/uL — AB (ref 150–399)
WBC: 9.3 10*3/mL

## 2016-11-21 LAB — BASIC METABOLIC PANEL
BUN: 10 mg/dL (ref 4–21)
Creatinine: 0.6 mg/dL (ref 0.5–1.1)
Glucose: 116 mg/dL
Potassium: 3.1 mmol/L — AB (ref 3.4–5.3)
Sodium: 141 mmol/L (ref 137–147)

## 2016-11-22 ENCOUNTER — Non-Acute Institutional Stay (SKILLED_NURSING_FACILITY): Payer: Medicare Other | Admitting: Internal Medicine

## 2016-11-22 ENCOUNTER — Encounter: Payer: Self-pay | Admitting: Internal Medicine

## 2016-11-22 DIAGNOSIS — F0391 Unspecified dementia with behavioral disturbance: Secondary | ICD-10-CM | POA: Diagnosis not present

## 2016-11-22 DIAGNOSIS — F411 Generalized anxiety disorder: Secondary | ICD-10-CM | POA: Diagnosis not present

## 2016-11-22 DIAGNOSIS — R451 Restlessness and agitation: Secondary | ICD-10-CM

## 2016-11-22 DIAGNOSIS — S52521S Torus fracture of lower end of right radius, sequela: Secondary | ICD-10-CM

## 2016-11-22 DIAGNOSIS — E876 Hypokalemia: Secondary | ICD-10-CM

## 2016-11-22 NOTE — Progress Notes (Signed)
LOCATION: [facility]  PCP: Woody Seller, MD   Code Status: DNR  Goals of care: Advanced Directive information Advanced Directives 11/20/2016  Does Patient Have a Medical Advance Directive? Yes  Type of Advance Directive Out of facility DNR (pink MOST or yellow form);Healthcare Power of Attorney  Does patient want to make changes to medical advance directive? No - Patient declined  Copy of [place] in Chart? Yes  Would patient like information on creating a medical advance directive? -  Pre-existing out of facility DNR order (yellow form or pink MOST form) Yellow form placed in chart (order not valid for inpatient use)       Extended Emergency Contact Information Primary Emergency Contact: Taylor,Sheila Address: [place], [place] [address] [place] Phone: [phone] Relation: Daughter Secondary Emergency Contact: Gavin Pound [place] Mobile Phone: [phone] Relation: Grandson   Allergies  Allergen Reactions  . Adhesive [Tape] Other (See Comments)    Sensitive to adhesives, must be removed with adhesive remover due to thin skin.  Octaviano Glow Hives    Chief Complaint  Patient presents with  . Acute Visit    Anxiety and agitation     HPI:  Patient is a 82 y.o. female seen today for acute visit. Staff have noticed increased anxiety and occasional agitation. She is seen in her room today. She mentions that people here are trying to get her. She wants to get out of here and get better. She denies any pain at present. She is here for rehabilitation post ORIF for  displaced distal right radius fracture.   Review of Systems:  Constitutional: Negative for fever, chills  HENT: Negative for headache Respiratory: Negative for shortness of breath and wheezing.   Cardiovascular: Negative for chest pain.  Gastrointestinal: Negative for heartburn, nausea,  vomiting, abdominal pain Genitourinary: Negative for dysuria Musculoskeletal: negative for pain this visit.    Skin: Negative for itching, rash.  Neurological: Negative for dizziness.    Past Medical History:  Diagnosis Date  . Anemia   . Anxiety    confusion  . Blood transfusion    2005 maybe  . Coronary artery disease   . Depression   . Diverticular disease   . Esophageal reflux   . Falls frequently   . Headache(784.0)    takes Prilosec  . Hypertension   . IBS (irritable bowel syndrome)   . Osteoarthrosis, unspecified whether generalized or localized, lower leg   . Other and unspecified hyperlipidemia   . Shortness of breath    when my heart is not doing right  . Takotsubo syndrome   . Wrist fracture    Past Surgical History:  Procedure Laterality Date  . BACK SURGERY    . BREAST RECONSTRUCTION    . CATARACT EXTRACTION W/ INTRAOCULAR LENS  IMPLANT, BILATERAL    . FRACTURE SURGERY    . HIP ARTHROPLASTY Right 06/10/2013   Procedure: ARTHROPLASTY MONOPOLAR HIP CEMENTED;  Surgeon: Marybelle Killings, MD;  Location: [facility];  Service: Orthopedics;  Laterality: Right;  . HYSTEROTOMY    . IMPLANTABLE CONTACT LENS IMPLANTATION    . JOINT REPLACEMENT    . KNEE SURGERY    . KYPHOPLASTY N/A 11/05/2013   Procedure: Jone Baseman TWO;  Surgeon: Ophelia Charter, MD;  Location: [facility];  Service: Neurosurgery;  Laterality: N/A;  . OPEN REDUCTION INTERNAL FIXATION (ORIF) DISTAL RADIAL  FRACTURE Right 11/14/2016   Procedure: OPEN REDUCTION INTERNAL FIXATION (ORIF) DISTAL RADIAL FRACTURE, with repair as indicated;  Surgeon: Iran Planas, MD;  Location: [facility];  Service: Orthopedics;  Laterality: Right;  . TONSILLECTOMY     as child   Social History:   reports that she has never smoked. She has never used smokeless tobacco. She reports that she does not drink alcohol or use drugs.  Family History  Problem Relation Age of Onset  . Heart failure Mother   . Stroke Sister   .  Lung cancer Brother   . Lung cancer Other     Medications: Allergies as of 11/22/2016      Reactions   Adhesive [tape] Other (See Comments)   Sensitive to adhesives, must be removed with adhesive remover due to thin skin.   Sulfamethoxazole-trimethoprim Hives      Medication List       Accurate as of 11/22/16 10:36 AM. Always use your most recent med list.          acetaminophen 500 MG tablet Commonly known as:  TYLENOL Take 1,000 mg by mouth 3 (three) times daily.   ALPRAZolam 0.5 MG tablet Commonly known as:  XANAX Take 1 tablet (0.5 mg total) by mouth 3 (three) times daily as needed for anxiety.   aspirin 81 MG EC tablet Take 81 mg by mouth daily. Swallow whole.   CALCIUM + D PO Take 1 tablet by mouth daily.   carvedilol 3.125 MG tablet Commonly known as:  COREG TAKE 1 TABLET BY MOUTH TWICE A DAY WITH A MEAL   CRANBERRY PO Take 2 capsules by mouth daily.   diphenoxylate-atropine 2.5-0.025 MG tablet Commonly known as:  LOMOTIL Take 1 tablet by mouth 2 (two) times daily.   escitalopram 20 MG tablet Commonly known as:  LEXAPRO Take 20 mg by mouth 2 (two) times daily.   multivitamin tablet Take 1 tablet by mouth daily.   nitroGLYCERIN 0.4 MG SL tablet Commonly known as:  NITROSTAT Place 1 tablet (0.4 mg total) under the tongue every 5 (five) minutes as needed. For chest pain.   omeprazole 20 MG capsule Commonly known as:  PRILOSEC Take 20 mg by mouth daily.   polyethylene glycol packet Commonly known as:  MIRALAX / GLYCOLAX Take 17 g by mouth daily as needed for mild constipation.   PROBIOTIC DAILY PO Take 1 tablet by mouth daily.   STOOL SOFTENER PO Take 1 tablet by mouth daily as needed (constipation).   traMADol 50 MG tablet Commonly known as:  ULTRAM Take 25 mg by mouth 2 (two) times daily.   trolamine salicylate 10 % cream Commonly known as:  ASPERCREME Apply 1 application topically as needed (for back pain).   UNABLE TO FIND Med Name:  Med pass 120 mL by mouth 3 times daily   vitamin B-12 1000 MCG tablet Commonly known as:  CYANOCOBALAMIN Take 1,000 mcg by mouth daily.       Immunizations: Immunization History  Administered Date(s) Administered  . PPD Test 11/11/2016  . Pneumococcal Polysaccharide-23 11/22/2014     Physical Exam: Vitals:   11/22/16 1028  BP: 134/83  Pulse: 74  Resp: 18  Temp: 99.2 F (37.3 C)  TempSrc: Oral  SpO2: 96%  Weight: 109 lb (49.4 kg)  Height: 5\' 3"  (1.6 m)   Body mass index is 19.31 kg/m.  General- elderly female, frail and thin built, in no acute distress Head- normocephalic, atraumatic Throat- moist mucus membrane, has dentures  Eyes- PERRLA, EOMI, no pallor, no icterus, no discharge Neck- no cervical lymphadenopathy Cardiovascular- normal s1,s2, no murmur Respiratory- bilateral clear to auscultation, no wheeze, no rhonchi, no crackles, no use of accessory muscles Abdomen- bowel sounds present, soft, non tender Musculoskeletal- cast to right forearm with sling, can move her fingers, able to move other extremities Neurological- alert and oriented to person and place today Skin- warm and dry Psychiatry- appears anxious and is paranoid     Labs reviewed: Basic Metabolic Panel:  Recent Labs  11/09/16 2218  11/14/16 1401 11/14/16 1910 11/15/16 0923 11/16/16 11/21/16  NA  --   < > 138 139 135 140 141  K  --   < > 3.1* 3.6 3.1* 4.1 3.1*  CL  --   < > 105 104 99*  --   --   CO2  --   < > 23 29 26   --   --   GLUCOSE  --   < > 119* 109* 152*  --   --   BUN  --   < > 14 12 6 10 10   CREATININE  --   < > 0.72 0.67 0.58 0.6 0.6  CALCIUM  --   < > 8.8* 8.5* 8.6*  --   --   MG 1.6*  --   --   --   --   --   --   < > = values in this interval not displayed. Liver Function Tests:  Recent Labs  11/14/16 1910 11/15/16 0923 11/16/16  AST 21 26 17   ALT 18 19 12   ALKPHOS 68 86 88  BILITOT 0.9 1.4*  --   PROT 5.6* 6.5  --   ALBUMIN 3.0* 3.3*  --    No results for  input(s): LIPASE, AMYLASE in the last 8760 hours. No results for input(s): AMMONIA in the last 8760 hours. CBC:  Recent Labs  11/11/16 0819 11/14/16 1401 11/15/16 0719 11/16/16 11/21/16  WBC 7.8 7.5 9.1 10.7 9.3  HGB 8.6* 9.5* 8.4* 9.1* 9.1*  HCT 25.3* 27.1* 25.9* 27* 28*  MCV 91.7 90.3 92.5  --   --   PLT 183 287 287 354 518*   Cardiac Enzymes:  Recent Labs  11/09/16 2218 11/10/16 0524 11/10/16 1044  TROPONINI <0.03 0.03* 0.03*   BNP: Invalid input(s): POCBNP CBG:  Recent Labs  11/10/16 0616 11/10/16 0737 11/11/16 0752  GLUCAP 124* 122* 100*    Radiological Exams: Dg Ribs Unilateral W/chest Right  Result Date: 11/09/2016 CLINICAL DATA:  Fall at home this morning. Right-sided rib and chest pain. Initial encounter. EXAM: RIGHT RIBS AND CHEST - 3+ VIEW COMPARISON:  Chest radiograph on 11/21/2014 FINDINGS: No fracture or other bone lesions are seen involving the ribs. There is no evidence of pneumothorax or pleural effusion. Both lungs are clear. Heart size is normal. Stable ectasia of thoracic aorta. Bilateral breast implants noted, as well as lumbar spine fusion hardware and previous upper lumbar vertebroplasty. IMPRESSION: No acute findings. Electronically Signed   By: Earle Gell M.D.   On: 11/09/2016 10:55   Dg Wrist Complete Right  Result Date: 11/09/2016 CLINICAL DATA:  Closed fracture distal radius postreduction EXAM: RIGHT WRIST - COMPLETE 3+ VIEW COMPARISON:  11/09/2016 FINDINGS: Three views of the right wrist submitted. Limited study by casting material artifact. Persistent comminuted fracture in distal right radius with dorsal angulation. No significant change in alignment from prior exam. IMPRESSION: Persistent comminuted fracture in distal right radius with dorsal angulation. No significant  change in alignment from prior exam. Electronically Signed   By: Lahoma Crocker M.D.   On: 11/09/2016 17:01   Dg Wrist Complete Right  Result Date: 11/09/2016 CLINICAL DATA:   Postreduction films. EXAM: RIGHT WRIST - COMPLETE 3+ VIEW COMPARISON:  Earlier the same day FINDINGS: Fine bony detail obscured by the overlying plaster. Fracture the distal radial metaphysis again noted. Bony alignment similar to the pre reduction films. Bones appear demineralized. IMPRESSION: No substantial interval change status post closed reduction. Electronically Signed   By: Misty Stanley M.D.   On: 11/09/2016 14:35   Dg Wrist Complete Right  Result Date: 11/09/2016 CLINICAL DATA:  Fall at home this a.m landing onto rt side complains of rt sided chest pain, rt wrist pain with deformity EXAM: RIGHT WRIST - COMPLETE 3+ VIEW COMPARISON:  None. FINDINGS: Fracture of the distal radial metaphysis with dorsal angulation. Fracture does not appear to enter the articular surface. Radiocarpal joint is intact. Mild comminution of the fracture. Fracture of the ulnar styloid age-indeterminate. IMPRESSION: 1. Comminuted fracture of the distal radius with dorsal angulation. 2. Radiocarpal joint intact. 3. Age-indeterminate ulnar styloid fracture. Electronically Signed   By: Suzy Bouchard M.D.   On: 11/09/2016 10:55   Ct Head Wo Contrast  Result Date: 11/15/2016 CLINICAL DATA:  Acute onset of altered mental status. Severe confusion. Recent fall. Initial encounter. EXAM: CT HEAD WITHOUT CONTRAST TECHNIQUE: Contiguous axial images were obtained from the base of the skull through the vertex without intravenous contrast. COMPARISON:  CT of the head performed 11/09/2016 FINDINGS: Brain: No evidence of acute infarction, hemorrhage, hydrocephalus, extra-axial collection or mass lesion/mass effect. Evaluation is suboptimal due to motion artifact. Prominence of the ventricles and sulci reflects mild cortical volume loss. Mild periventricular white matter change likely reflects small vessel ischemic microangiopathy. The brainstem and fourth ventricle are within normal limits. The basal ganglia are unremarkable in appearance.  The cerebral hemispheres demonstrate grossly normal gray-white differentiation. No mass effect or midline shift is seen. Vascular: No hyperdense vessel or unexpected calcification. Skull: There is no evidence of fracture; visualized osseous structures are unremarkable in appearance. Sinuses/Orbits: The visualized portions of the orbits are within normal limits. The paranasal sinuses and mastoid air cells are well-aerated. Other: No significant soft tissue abnormalities are seen. IMPRESSION: 1. No evidence of traumatic intracranial injury or fracture. Evaluation is suboptimal due to motion artifact. 2. Mild cortical volume loss and scattered small vessel ischemic microangiopathy. Electronically Signed   By: Garald Balding M.D.   On: 11/15/2016 01:00   Ct Head Wo Contrast  Result Date: 11/09/2016 CLINICAL DATA:  Patient presents to the emergency department after tripping over her cat food bowl this morning and landing on her right wrist and right lateral chest. She presents with obvious deformity of her right wrist. She denies head injury or headache at this time. She denies neck pain. She denies recent illness. She is alert and oriented 4. She presents via EMS. She denies hip pain. Her only complaint is focused right wrist pain. She denies neck pain EXAM: CT HEAD WITHOUT CONTRAST CT CERVICAL SPINE WITHOUT CONTRAST TECHNIQUE: Multidetector CT imaging of the head and cervical spine was performed following the standard protocol without intravenous contrast. Multiplanar CT image reconstructions of the cervical spine were also generated. COMPARISON:  11/20/2014 FINDINGS: CT HEAD FINDINGS Brain: No evidence of acute infarction, hemorrhage, hydrocephalus, extra-axial collection or mass lesion/mass effect. Vascular: No hyperdense vessel or unexpected calcification. Skull: Normal. Negative for fracture or focal lesion.  Sinuses/Orbits: No acute finding. Other: None. CT CERVICAL SPINE FINDINGS Alignment: Hyperextended but  otherwise unremarkable with no spondylolisthesis. Skull base and vertebrae: No acute fracture. No primary bone lesion or focal pathologic process. Soft tissues and spinal canal: No prevertebral fluid or swelling. No visible canal hematoma. Disc levels: Mild to moderate loss disc height at C3-C4. Moderate loss disc height at C5-C6 and C6-C7. Endplate spurring at these levels. Bilateral facet degenerative change. Bones are demineralized. There are mild areas of disc bulging, but no convincing disc herniation. Upper chest: Apical pleuroparenchymal scarring.  No acute findings. Other: None. IMPRESSION: HEAD CT:  No acute intracranial abnormalities.  No skull fracture. CERVICAL CT:  No fracture or acute finding. Electronically Signed   By: Lajean Manes M.D.   On: 11/09/2016 17:54   Ct Cervical Spine Wo Contrast  Result Date: 11/09/2016 CLINICAL DATA:  Patient presents to the emergency department after tripping over her cat food bowl this morning and landing on her right wrist and right lateral chest. She presents with obvious deformity of her right wrist. She denies head injury or headache at this time. She denies neck pain. She denies recent illness. She is alert and oriented 4. She presents via EMS. She denies hip pain. Her only complaint is focused right wrist pain. She denies neck pain EXAM: CT HEAD WITHOUT CONTRAST CT CERVICAL SPINE WITHOUT CONTRAST TECHNIQUE: Multidetector CT imaging of the head and cervical spine was performed following the standard protocol without intravenous contrast. Multiplanar CT image reconstructions of the cervical spine were also generated. COMPARISON:  11/20/2014 FINDINGS: CT HEAD FINDINGS Brain: No evidence of acute infarction, hemorrhage, hydrocephalus, extra-axial collection or mass lesion/mass effect. Vascular: No hyperdense vessel or unexpected calcification. Skull: Normal. Negative for fracture or focal lesion. Sinuses/Orbits: No acute finding. Other: None. CT CERVICAL SPINE  FINDINGS Alignment: Hyperextended but otherwise unremarkable with no spondylolisthesis. Skull base and vertebrae: No acute fracture. No primary bone lesion or focal pathologic process. Soft tissues and spinal canal: No prevertebral fluid or swelling. No visible canal hematoma. Disc levels: Mild to moderate loss disc height at C3-C4. Moderate loss disc height at C5-C6 and C6-C7. Endplate spurring at these levels. Bilateral facet degenerative change. Bones are demineralized. There are mild areas of disc bulging, but no convincing disc herniation. Upper chest: Apical pleuroparenchymal scarring.  No acute findings. Other: None. IMPRESSION: HEAD CT:  No acute intracranial abnormalities.  No skull fracture. CERVICAL CT:  No fracture or acute finding. Electronically Signed   By: Lajean Manes M.D.   On: 11/09/2016 17:54   Ct Angio Chest/abd/pel For Dissection W And/or Wo Contrast  Result Date: 11/09/2016 CLINICAL DATA:  Syncope with hypertension and hypoxia. Evaluate for aortic dissection. EXAM: CT ANGIOGRAPHY CHEST, ABDOMEN AND PELVIS TECHNIQUE: Multidetector CT imaging through the chest, abdomen and pelvis was performed using the standard protocol during bolus administration of intravenous contrast. Multiplanar reconstructed images and MIPs were obtained and reviewed to evaluate the vascular anatomy. CONTRAST:  100 CC Isovue 370 COMPARISON:  None. FINDINGS: CTA CHEST FINDINGS Study is markedly motion degraded. Cardiovascular: Precontrast imaging shows no hyperdense crescent in the wall of the thoracic aorta to suggest acute intramural hematoma. Ascending thoracic aorta measures approximately 3.3 cm diameter. Within the limitation of 9 gated technique and substantial patient motion, no definite dissection flap is identified in the thoracic aorta. Heart size is normal. No pericardial effusion. No large central pulmonary embolus in the main pulmonary outflow tract or either main pulmonary artery. Mediastinum/Nodes: No  mediastinal  lymphadenopathy. There is no hilar lymphadenopathy. There is no axillary lymphadenopathy. Lungs/Pleura: No focal airspace consolidation. No overt pulmonary edema. No substantial pleural effusion. Given the extreme motion artifact, small pulmonary nodules could be obscured. Musculoskeletal: No gross lytic or sclerotic osseous abnormality is identified. Assessment of bony anatomy for fracture is markedly limited by the motion artifact. Review of the MIP images confirms the above findings. CTA ABDOMEN AND PELVIS FINDINGS VASCULAR Aorta: No thoracic aortic aneurysm. Celiac: Patent. Assessment for stenosis not possible due to motion artifact. SMA: Patent. Assessment for proximal stenosis not possible due to motion artifact. Renals: Patent bilaterally. Accessory left renal artery noted. Fine detail obscured by motion artifact. IMA: Not visualized but there is substantial motion artifact in this region. Veins: Portal vein and superior mesenteric vein are patent. IVC appears patent although assessment is degraded by streak artifact from spinal fixation hardware and motion artifact. Review of the MIP images confirms the above findings. NON-VASCULAR Hepatobiliary: No gross abnormality in the liver parenchyma. There is no evidence for gallstones, gallbladder wall thickening, or pericholecystic fluid. No intrahepatic or extrahepatic biliary dilation. Pancreas: Limited assessment due to motion without focal abnormality identified. No dilatation of the main pancreatic duct. Spleen: Unremarkable. Adrenals/Urinary Tract: No adrenal nodule or mass. No hydronephrosis or gross renal mass evident. No evidence for hydroureter. Bladder is obscured by streak artifact from right hip replacement. Stomach/Bowel: Stomach is nondistended. Duodenum not well evaluated. No evidence for small bowel obstruction. No evidence for colonic obstruction. Bowel extends into a right groin hernia but cannot be clearly discerned given streak  artifact from the hip replacement and patient motion. Lymphatic: No gross lymphadenopathy can be identified in the abdomen or pelvis although right pelvic sidewall not well evaluated. Reproductive: Obscured. Other: No substantial intraperitoneal free fluid. Musculoskeletal: Diffuse demineralization. Right hip replacement. Old inferior right pubic ramus fracture. Status post lower lumbar fusion. Multilevel thoracolumbar compression fractures. Review of the MIP images confirms the above findings. IMPRESSION: 1. Study is markedly degraded by patient motion throughout image acquisition imaging through the pelvis further degraded by lumbar fusion hardware and right hip replacement. 2. Within the above-stated limitation, no definite dissection is identified in the thoraco abdominal aorta. There is no thoracoabdominal aortic aneurysm. 3. Right groin hernia contains bowel but is largely obscured by streak artifact and motion artifact. No findings to suggest bowel obstruction at this time. 4. Diffuse bony demineralization with multiple thoracolumbar compression fractures. These are age-indeterminate and cannot be further evaluated given the marked motion degradation. Electronically Signed   By: Misty Stanley M.D.   On: 11/09/2016 21:25    Assessment/Plan  Agitation Likely related to dementia and being in a new environment. Wbc normal. Has history of anxiety and dementia. Make changes to her medication as below. Consider small dose seroquel if needed.   GAD Currently on alprazolam 0.5 mg tid prn. Start sertraline 25 mg daily for now x 1 week, then 50 mg daily and assess further. Wean off lexapro as below. Psych consult.  Dementia with behavior disturbance On lexapro 20 mg bid, given initiation of sertraline, decrease lexapro to 20 mg daily x 1 week and then discontinue. Start sertraline as above. Consider low dose seroquel if behavior worsens.   Hypokalemia Start kcl 20 meq bid x 3 days, then 20 meq daily for  now. No diarrhea reported. No drugs noted in med list that would contribute to hypokalemia. Check bmp and mg 11/26/16  Right distal radius fracture s/p ORIF. Currently on tramadol 25 mg  bid with tylenol 1000 mg tid. PMR to follow.  Working with PT and OT    Goals of care: short term rehabilitation   Labs/tests ordered: cbc, bmp 11/20/16  Family/ staff Communication: reviewed care plan with patient and nursing supervisor    Blanchie Serve, MD Internal Medicine [place], [place] [address] Cell Phone (Monday-Friday 8 am - 5 pm): [phone] On Call: [phone] and follow prompts after 5 pm and on weekends Office Phone: [phone] Office Fax: [phone]

## 2016-11-26 LAB — BASIC METABOLIC PANEL
BUN: 9 mg/dL (ref 4–21)
CREATININE: 0.55
GLUCOSE: 105
POTASSIUM: 4.3 mmol/L
Sodium: 137

## 2016-11-26 LAB — MAGNESIUM: MAGNESIUM: 2.1

## 2016-11-27 ENCOUNTER — Encounter: Payer: Self-pay | Admitting: *Deleted

## 2016-12-11 ENCOUNTER — Non-Acute Institutional Stay (SKILLED_NURSING_FACILITY): Payer: Medicare Other | Admitting: Family

## 2016-12-11 DIAGNOSIS — F32A Depression, unspecified: Secondary | ICD-10-CM

## 2016-12-11 DIAGNOSIS — K219 Gastro-esophageal reflux disease without esophagitis: Secondary | ICD-10-CM | POA: Diagnosis not present

## 2016-12-11 DIAGNOSIS — I5032 Chronic diastolic (congestive) heart failure: Secondary | ICD-10-CM

## 2016-12-11 DIAGNOSIS — K529 Noninfective gastroenteritis and colitis, unspecified: Secondary | ICD-10-CM

## 2016-12-11 DIAGNOSIS — B372 Candidiasis of skin and nail: Secondary | ICD-10-CM

## 2016-12-11 DIAGNOSIS — F0391 Unspecified dementia with behavioral disturbance: Secondary | ICD-10-CM | POA: Diagnosis not present

## 2016-12-11 DIAGNOSIS — I2510 Atherosclerotic heart disease of native coronary artery without angina pectoris: Secondary | ICD-10-CM | POA: Diagnosis not present

## 2016-12-11 DIAGNOSIS — R2681 Unsteadiness on feet: Secondary | ICD-10-CM | POA: Diagnosis not present

## 2016-12-11 DIAGNOSIS — F329 Major depressive disorder, single episode, unspecified: Secondary | ICD-10-CM | POA: Diagnosis not present

## 2016-12-11 DIAGNOSIS — F411 Generalized anxiety disorder: Secondary | ICD-10-CM | POA: Diagnosis not present

## 2016-12-11 NOTE — Progress Notes (Signed)
Location:  [facility] Room Number: [phone]  Place of Service:  SNF [phone])  Provider: Marlowe Sax FNP-C   PCP: Christain Sacramento, MD Patient Care Team: Christain Sacramento, MD as PCP - General (Family Medicine) Sherren Mocha, MD as Consulting Physician (Cardiology)  Extended Emergency Contact Information Primary Emergency Contact: Taylor,Sheila Address: [place], [place] [address] [place] of [place] Phone: [phone] Relation: Daughter Secondary Emergency Contact: Gavin Pound [place] of [place] Mobile Phone: [phone] Relation: Grandson  Code Status: DNR   Goals of care:  Advanced Directive information Advanced Directives 11/20/2016  Does Patient Have a Medical Advance Directive? Yes  Type of Advance Directive Out of facility DNR (pink MOST or yellow form);Healthcare Power of Attorney  Does patient want to make changes to medical advance directive? No - Patient declined  Copy of [place] in Chart? Yes  Would patient like information on creating a medical advance directive? -  Pre-existing out of facility DNR order (yellow form or pink MOST form) Yellow form placed in chart (order not valid for inpatient use)     Allergies  Allergen Reactions  . Adhesive [Tape] Other (See Comments)    Sensitive to adhesives, must be removed with adhesive remover due to thin skin.  Octaviano Glow Hives    Chief Complaint  Patient presents with  . Discharge Note    Discharge home from SNF     HPI:  82 y.o. female seen today at [facility] for discharge home. She was here for short term rehabilitation for post hospital re-admission from 11/14/16-11/15/16 with displaced distal right radius fracture. She underwent open reduction and internal fixation. She was at this facility undergoing rehabilitation prior to this with displaced right distal radius fracture and  hypoxia. She was awaiting surgery.She  Has a medical history of HTN, CHF,Anxiety, depression, GERD,IBS, Dementia among other conditions. She is seen in her room today.she denies any acute issues this visit. She remains a high risk for falls.While here in rehab she was treated for increased agitation, anxiety and malnutrition.She was seen by Psychiatry service. Sertraline initiated for anxiety and Seroquel for psychosis with much improvement.she was also seen by PMR specialist for pain management during rehab stay.  She has worked well with PT/OT now stable for discharge home.She will be discharged home with Home health PT/OT to continue with ROM, Exercise, Gait stability and muscle strengthening.She will require a  right arm platform walker to allow her to maintain current level of independence with ADL's. Home health services will be arranged by facility social worker prior to discharge.She will be discharged with her medication from the facility. Prescription medication will be written x 1 month then patient to follow up with PCP in 1-2 weeks.Facility staff report no new concerns.  Past Medical History:  Diagnosis Date  . Anemia   . Anxiety    confusion  . Blood transfusion    2005 maybe  . Coronary artery disease   . Depression   . Diverticular disease   . Esophageal reflux   . Falls frequently   . Headache(784.0)    takes Prilosec  . Hypertension   . IBS (irritable bowel syndrome)   . Osteoarthrosis, unspecified whether generalized or localized, lower leg   . Other and unspecified hyperlipidemia   . Shortness of breath    when my heart is not doing right  .  Takotsubo syndrome   . Wrist fracture     Past Surgical History:  Procedure Laterality Date  . BACK SURGERY    . BREAST RECONSTRUCTION    . CATARACT EXTRACTION W/ INTRAOCULAR LENS  IMPLANT, BILATERAL    . FRACTURE SURGERY    . HIP ARTHROPLASTY Right 06/10/2013   Procedure: ARTHROPLASTY MONOPOLAR HIP CEMENTED;  Surgeon: Marybelle Killings, MD;  Location: [facility];  Service: Orthopedics;  Laterality: Right;  . HYSTEROTOMY    . IMPLANTABLE CONTACT LENS IMPLANTATION    . JOINT REPLACEMENT    . KNEE SURGERY    . KYPHOPLASTY N/A 11/05/2013   Procedure: Jone Baseman TWO;  Surgeon: Ophelia Charter, MD;  Location: [facility];  Service: Neurosurgery;  Laterality: N/A;  . OPEN REDUCTION INTERNAL FIXATION (ORIF) DISTAL RADIAL FRACTURE Right 11/14/2016   Procedure: OPEN REDUCTION INTERNAL FIXATION (ORIF) DISTAL RADIAL FRACTURE, with repair as indicated;  Surgeon: Iran Planas, MD;  Location: [facility];  Service: Orthopedics;  Laterality: Right;  . TONSILLECTOMY     as child      reports that she has never smoked. She has never used smokeless tobacco. She reports that she does not drink alcohol or use drugs. Social History   Social History  . Marital status: Widowed    Spouse name: N/A  . Number of children: N/A  . Years of education: N/A   Occupational History  . Not on file.   Social History Main Topics  . Smoking status: Never Smoker  . Smokeless tobacco: Never Used  . Alcohol use No  . Drug use: No  . Sexual activity: No   Other Topics Concern  . Not on file   Social History Narrative    Lives locally with her husband.  While she herself does not    smoke, she reports she had substantial exposure to second hand smoke during her adult life.    Allergies  Allergen Reactions  . Adhesive [Tape] Other (See Comments)    Sensitive to adhesives, must be removed with adhesive remover due to thin skin.  Octaviano Glow Hives    Pertinent  Health Maintenance Due  Topic Date Due  . PNA vac Low Risk Adult (2 of 2 - PCV13) 11/22/2015  . INFLUENZA VACCINE  02/20/2017  . DEXA SCAN  Completed    Medications: Allergies as of 12/11/2016      Reactions   Adhesive [tape] Other (See Comments)   Sensitive to adhesives, must be removed with adhesive remover due to thin skin.    Sulfamethoxazole-trimethoprim Hives      Medication List       Accurate as of 12/11/16  3:21 PM. Always use your most recent med list.          acetaminophen 500 MG tablet Commonly known as:  TYLENOL Take 1,000 mg by mouth 3 (three) times daily.   ALPRAZolam 0.5 MG tablet Commonly known as:  XANAX Take 1 tablet (0.5 mg total) by mouth 3 (three) times daily as needed for anxiety.   aspirin 81 MG EC tablet Take 81 mg by mouth daily. Swallow whole.   CALCIUM + D PO Take 1 tablet by mouth daily.   carvedilol 3.125 MG tablet Commonly known as:  COREG TAKE 1 TABLET BY MOUTH TWICE A DAY WITH A MEAL   CRANBERRY PO Take 2 capsules by mouth daily.   diphenoxylate-atropine 2.5-0.025 MG tablet Commonly known as:  LOMOTIL Take 1 tablet by mouth 2 (two) times daily.  feeding supplement (PRO-STAT SUGAR FREE 64) Liqd Take 30 mLs by mouth daily.   multivitamin tablet Take 1 tablet by mouth daily.   nitroGLYCERIN 0.4 MG SL tablet Commonly known as:  NITROSTAT Place 1 tablet (0.4 mg total) under the tongue every 5 (five) minutes as needed. For chest pain.   omeprazole 20 MG capsule Commonly known as:  PRILOSEC Take 20 mg by mouth daily.   polyethylene glycol packet Commonly known as:  MIRALAX / GLYCOLAX Take 17 g by mouth daily as needed for mild constipation.   potassium chloride SA 20 MEQ tablet Commonly known as:  K-DUR,KLOR-CON Take 20 mEq by mouth 2 (two) times daily.   PROBIOTIC DAILY PO Take 1 tablet by mouth daily.   QUEtiapine 50 MG tablet Commonly known as:  SEROQUEL Take 50 mg by mouth at bedtime.   sertraline 50 MG tablet Commonly known as:  ZOLOFT Take 50 mg by mouth daily.   STOOL SOFTENER PO Take 1 tablet by mouth daily as needed (constipation).   traMADol 50 MG tablet Commonly known as:  ULTRAM Take 25 mg by mouth 2 (two) times daily.   trolamine salicylate 10 % cream Commonly known as:  ASPERCREME Apply 1 application topically as needed (for  back pain).   UNABLE TO FIND Med Name: Med pass 120 mL by mouth 3 times daily   vitamin B-12 1000 MCG tablet Commonly known as:  CYANOCOBALAMIN Take 1,000 mcg by mouth daily.       Review of Systems  Constitutional: Negative for activity change, appetite change, chills, fatigue and fever.  HENT: Negative for congestion, rhinorrhea, sinus pain, sinus pressure, sneezing and sore throat.   Eyes: Negative.   Respiratory: Negative for cough, chest tightness, shortness of breath and wheezing.   Cardiovascular: Negative for chest pain, palpitations and leg swelling.  Gastrointestinal: Negative for abdominal distention, abdominal pain, constipation, diarrhea, nausea and vomiting.  Endocrine: Negative.   Genitourinary: Negative for dysuria, flank pain, frequency and urgency.  Musculoskeletal: Positive for gait problem.       Right arm and hip pain under control with current regimen   Skin: Negative for color change, pallor and rash.  Neurological: Negative for dizziness, seizures, light-headedness and headaches.  Hematological: Does not bruise/bleed easily.  Psychiatric/Behavioral: Negative for agitation, hallucinations and sleep disturbance. The patient is not nervous/anxious.     Vitals:   12/11/16 1030  BP: 123/80  Pulse: 84  Resp: 18  Temp: (!) 96.9 F (36.1 C)  SpO2: 95%  Weight: 105 lb 12.8 oz (48 kg)  Height: 5\' 3"  (1.6 m)   Body mass index is 18.74 kg/m. Physical Exam  Constitutional:  Thin frail elderly in no acute distress   HENT:  Head: Normocephalic.  Mouth/Throat: Oropharynx is clear and moist. No oropharyngeal exudate.  Eyes: Conjunctivae and EOM are normal. Pupils are equal, round, and reactive to light. Right eye exhibits no discharge. Left eye exhibits no discharge. No scleral icterus.  Neck: Normal range of motion. No JVD present. No thyromegaly present.  Cardiovascular: Normal rate, regular rhythm and intact distal pulses.  Exam reveals no gallop and no  friction rub.   No murmur heard. Pulmonary/Chest: Effort normal and breath sounds normal. No respiratory distress. She has no wheezes. She has no rales.  Abdominal: Soft. Bowel sounds are normal. She exhibits no distension. There is no tenderness. There is no rebound and no guarding.  Musculoskeletal: She exhibits no edema, tenderness or deformity.  Unsteady gait. RUE cast in  place.   Lymphadenopathy:    She has no cervical adenopathy.  Neurological:  Confused at times   Skin: Skin is warm and dry. No rash noted. No erythema. No pallor.  1. Right forearm cast dry, clean and intact able to move fingers without any difficulties.  2. Right hip; purple bruise along thigh area slight tender to touch though much improvement compared to previous visit.surrounding tissue without signs of infections.  3. Sacral /gluteal; skin beefy redness noted.   Psychiatric: She has a normal mood and affect.    Labs reviewed: Basic Metabolic Panel:  Recent Labs  11/09/16 2218  11/14/16 1401 11/14/16 1910 11/15/16 0923 11/16/16 11/21/16 11/26/16  NA  --   < > 138 139 135 140 141 137  K  --   < > 3.1* 3.6 3.1* 4.1 3.1* 4.3  CL  --   < > 105 104 99*  --   --   --   CO2  --   < > 23 29 26   --   --   --   GLUCOSE  --   < > 119* 109* 152*  --   --   --   BUN  --   < > 14 12 6 10 10 9   CREATININE  --   < > 0.72 0.67 0.58 0.6 0.6 0.55  CALCIUM  --   < > 8.8* 8.5* 8.6*  --   --   --   MG 1.6*  --   --   --   --   --   --  2.1  < > = values in this interval not displayed. Liver Function Tests:  Recent Labs  11/14/16 1910 11/15/16 0923 11/16/16  AST 21 26 17   ALT 18 19 12   ALKPHOS 68 86 88  BILITOT 0.9 1.4*  --   PROT 5.6* 6.5  --   ALBUMIN 3.0* 3.3*  --    CBC:  Recent Labs  11/11/16 0819 11/14/16 1401 11/15/16 0719 11/16/16 11/21/16  WBC 7.8 7.5 9.1 10.7 9.3  HGB 8.6* 9.5* 8.4* 9.1* 9.1*  HCT 25.3* 27.1* 25.9* 27* 28*  MCV 91.7 90.3 92.5  --   --   PLT 183 287 287 354 518*   Cardiac  Enzymes:  Recent Labs  11/09/16 2218 11/10/16 0524 11/10/16 1044  TROPONINI <0.03 0.03* 0.03*    Recent Labs  11/10/16 0616 11/10/16 0737 11/11/16 0752  GLUCAP 124* 122* 100*    Assessment/Plan:   1. Unsteady gait   Has worked well with PT/ OT. Will discharge home PT/OT to continue with ROM, Exercise, Gait stability and muscle strengthening. Right arm platform walker to allow her to maintain current level of independence with ADL's. Fall and safety precautions.   2. Chronic diastolic heart failure  Stable.continue on coreg 3.125 mg tablet twice daily. Continue to fluid restrictions 1.5 Liters per day.NAS diet.monitor weight.      3. Coronary artery disease Chest pain free.continue on ASA EC tablet and coreg.    4. Gastroesophageal reflux disease without esophagitis Stable. Continue on omeprazole.   5. Chronic diarrhea Hx of IBS. Afebrile. Continue her lomotil twice daily.   6. GAD (generalized anxiety disorder) Sertraline 50 mg Tablet initiated during rehab with much improvement.continue to monitor.   7. Depression Seen by psychiatry service during rehab.Continue sertraline 50 mg Tablet daily. Monitor for mood changes.   8. Dementia with behavioral disturbance Followed by Psychiatry during rehab stay. Seroquel initiated for psychosis with  much improvement.continue on MVI and protein supplement. Fall and safety precautions. Skin care.     9. Right distal radius fracture s/p ORIF.Cast in place.Seen by PMR during rehab.will discharge home with PT/OT. Continue to follow up Ortho.continue on tramadol 25 mg bid with tylenol 1000 mg tid.     10. Candidiasis  Sacral and gluteal areas beefy redness noted this visit. Will start on nystatin 100,000 units cream apply twice daily until redness resolved.   Patient is being discharged with the following home health services:   -PT/OT for ROM, exercise, gait stability and muscle strengthening -  HH RN for wound care management    Patient is being discharged with the following durable medical equipment:   - Right arm platform walker to allow her to maintain current level of independence with ADL's.   Patient has been advised to f/u with their PCP in 1-2 weeks to for a transitions of care visit.Social services at their facility was responsible for arranging this appointment.  Pt was provided with adequate prescriptions of noncontrolled medications to reach the scheduled appointment.For controlled substances, a limited supply was provided as appropriate for the individual patient. If the pt normally receives these medications from a pain clinic or has a contract with another physician, these medications should be received from that clinic or physician only).    Future labs/tests needed:  CBC, BMP in 1-2 weeks PCP

## 2016-12-18 ENCOUNTER — Encounter (HOSPITAL_COMMUNITY): Payer: Self-pay | Admitting: Orthopedic Surgery

## 2016-12-18 ENCOUNTER — Emergency Department (HOSPITAL_COMMUNITY): Payer: Medicare Other

## 2016-12-18 ENCOUNTER — Emergency Department (HOSPITAL_COMMUNITY)
Admission: EM | Admit: 2016-12-18 | Discharge: 2016-12-19 | Disposition: A | Discharge status: Home / Self Care | Payer: Medicare Other | Attending: Emergency Medicine | Admitting: Emergency Medicine

## 2016-12-18 DIAGNOSIS — Z79899 Other long term (current) drug therapy: Secondary | ICD-10-CM | POA: Diagnosis not present

## 2016-12-18 DIAGNOSIS — Z96641 Presence of right artificial hip joint: Secondary | ICD-10-CM | POA: Insufficient documentation

## 2016-12-18 DIAGNOSIS — I5032 Chronic diastolic (congestive) heart failure: Secondary | ICD-10-CM | POA: Diagnosis present

## 2016-12-18 DIAGNOSIS — K219 Gastro-esophageal reflux disease without esophagitis: Secondary | ICD-10-CM | POA: Diagnosis present

## 2016-12-18 DIAGNOSIS — K529 Noninfective gastroenteritis and colitis, unspecified: Secondary | ICD-10-CM | POA: Diagnosis present

## 2016-12-18 DIAGNOSIS — Z7982 Long term (current) use of aspirin: Secondary | ICD-10-CM | POA: Insufficient documentation

## 2016-12-18 DIAGNOSIS — R109 Unspecified abdominal pain: Secondary | ICD-10-CM | POA: Diagnosis present

## 2016-12-18 DIAGNOSIS — I10 Essential (primary) hypertension: Secondary | ICD-10-CM | POA: Diagnosis present

## 2016-12-18 DIAGNOSIS — I2510 Atherosclerotic heart disease of native coronary artery without angina pectoris: Secondary | ICD-10-CM | POA: Diagnosis present

## 2016-12-18 DIAGNOSIS — F411 Generalized anxiety disorder: Secondary | ICD-10-CM | POA: Diagnosis present

## 2016-12-18 DIAGNOSIS — F0390 Unspecified dementia without behavioral disturbance: Secondary | ICD-10-CM | POA: Diagnosis present

## 2016-12-18 LAB — COMPREHENSIVE METABOLIC PANEL
ALT: 13 U/L — ABNORMAL LOW (ref 14–54)
AST: 23 U/L (ref 15–41)
Albumin: 3.5 g/dL (ref 3.5–5.0)
Alkaline Phosphatase: 184 U/L — ABNORMAL HIGH (ref 38–126)
Anion gap: 12 (ref 5–15)
BILIRUBIN TOTAL: 0.7 mg/dL (ref 0.3–1.2)
BUN: 6 mg/dL (ref 6–20)
CO2: 22 mmol/L (ref 22–32)
Calcium: 9 mg/dL (ref 8.9–10.3)
Chloride: 104 mmol/L (ref 101–111)
Creatinine, Ser: 0.69 mg/dL (ref 0.44–1.00)
GFR calc Af Amer: >60 mL/min (ref >60)
Glucose, Bld: 118 mg/dL — ABNORMAL HIGH (ref 65–99)
POTASSIUM: 3.8 mmol/L (ref 3.5–5.1)
Sodium: 138 mmol/L (ref 135–145)
TOTAL PROTEIN: 6.8 g/dL (ref 6.5–8.1)

## 2016-12-18 LAB — URINALYSIS, ROUTINE W REFLEX MICROSCOPIC
Bilirubin Urine: NEGATIVE
Glucose, UA: 50 mg/dL — AB
KETONES UR: 5 mg/dL — AB
LEUKOCYTES UA: NEGATIVE
NITRITE: NEGATIVE
PROTEIN: NEGATIVE mg/dL
Specific Gravity, Urine: 1.005 (ref 1.005–1.030)
Squamous Epithelial / HPF: NONE SEEN
WBC UA: NONE SEEN WBC/hpf (ref 0–5)
pH: 6 (ref 5.0–8.0)

## 2016-12-18 LAB — CBC
HEMATOCRIT: 33.8 % — AB (ref 36.0–46.0)
HEMOGLOBIN: 11.5 g/dL — AB (ref 12.0–15.0)
MCH: 31.2 pg (ref 26.0–34.0)
MCHC: 34 g/dL (ref 30.0–36.0)
MCV: 91.6 fL (ref 78.0–100.0)
Platelets: 315 10*3/uL (ref 150–400)
RBC: 3.69 MIL/uL — ABNORMAL LOW (ref 3.87–5.11)
RDW: 13.3 % (ref 11.5–15.5)
WBC: 7.7 10*3/uL (ref 4.0–10.5)

## 2016-12-18 LAB — LIPASE, BLOOD: LIPASE: 19 U/L (ref 11–51)

## 2016-12-18 MED ORDER — CIPROFLOXACIN HCL 500 MG PO TABS: 500.0000 mg | ORAL_TABLET | Freq: Two times a day (BID) | ORAL | 0 refills | Status: DC

## 2016-12-18 MED ORDER — PANTOPRAZOLE SODIUM 40 MG IV SOLR
40.0000 mg | Freq: Once | INTRAVENOUS | Status: AC
  Administered 2016-12-18: 40 mg via INTRAVENOUS
  Filled 2016-12-18: qty 40

## 2016-12-18 MED ORDER — TRAMADOL HCL 50 MG PO TABS
50.0000 mg | ORAL_TABLET | Freq: Once | ORAL | Status: AC
  Administered 2016-12-18: 50 mg via ORAL
  Filled 2016-12-18: qty 1

## 2016-12-18 MED ORDER — SODIUM CHLORIDE 0.9 % IV SOLN
Freq: Once | INTRAVENOUS | Status: AC
  Administered 2016-12-18: 20:00:00 via INTRAVENOUS

## 2016-12-18 MED ORDER — IOPAMIDOL (ISOVUE-300) INJECTION 61%
30.0000 mL | Freq: Once | INTRAVENOUS | Status: AC | PRN
  Administered 2016-12-18: 30 mL via ORAL

## 2016-12-18 MED ORDER — METRONIDAZOLE 500 MG PO TABS: 500.0000 mg | ORAL_TABLET | Freq: Two times a day (BID) | ORAL | 0 refills | Status: DC

## 2016-12-18 MED ORDER — LEVOFLOXACIN IN D5W 500 MG/100ML IV SOLN
500.0000 mg | Freq: Once | INTRAVENOUS | Status: DC
  Filled 2016-12-18: qty 100

## 2016-12-18 MED ORDER — IOPAMIDOL (ISOVUE-300) INJECTION 61%
INTRAVENOUS | Status: AC
  Filled 2016-12-18: qty 30

## 2016-12-18 MED ORDER — METRONIDAZOLE IN NACL 5-0.79 MG/ML-% IV SOLN: 500.0000 mg | Freq: Three times a day (TID) | INTRAVENOUS | Status: DC

## 2016-12-18 MED ORDER — TRAMADOL HCL 50 MG PO TABS: 50.0000 mg | ORAL_TABLET | Freq: Four times a day (QID) | ORAL | 0 refills | Status: DC | PRN

## 2016-12-18 MED ORDER — CIPROFLOXACIN HCL 500 MG PO TABS
500.0000 mg | ORAL_TABLET | Freq: Once | ORAL | Status: AC
  Administered 2016-12-18: 500 mg via ORAL
  Filled 2016-12-18: qty 1

## 2016-12-18 MED ORDER — IOPAMIDOL (ISOVUE-300) INJECTION 61%
100.0000 mL | Freq: Once | INTRAVENOUS | Status: AC | PRN
  Administered 2016-12-18: 80 mL via INTRAVENOUS

## 2016-12-18 MED ORDER — METRONIDAZOLE IN NACL 5-0.79 MG/ML-% IV SOLN
500.0000 mg | Freq: Once | INTRAVENOUS | Status: DC
  Filled 2016-12-18: qty 100

## 2016-12-18 MED ORDER — IOPAMIDOL (ISOVUE-300) INJECTION 61%
INTRAVENOUS | Status: AC
  Filled 2016-12-18: qty 100

## 2016-12-18 MED ORDER — METRONIDAZOLE 500 MG PO TABS
500.0000 mg | ORAL_TABLET | Freq: Once | ORAL | Status: AC
  Administered 2016-12-18: 500 mg via ORAL
  Filled 2016-12-18: qty 1

## 2016-12-18 NOTE — ED Provider Notes (Signed)
[facility] DEPT Provider Note   CSN: [id number] Arrival date & time: 12/18/16  1808     History   Chief Complaint Chief Complaint  Patient presents with  . Abdominal Pain    HPI [name] is a 82 y.o. female.  HPI Several weeks of wax waning abdominal pain that gets severe and cramping. Also wax waning yellow diarrhea not always associated with pain episodes. Decreased PO intake. No vomiting/ no nausea. Before 2 weeks ago occasional pain that resolved. No urinary symptoms. No radiation to back. Tried prune juice with no improvement. Past Medical History:  Diagnosis Date  . Anemia   . Anxiety    confusion  . Blood transfusion    2005 maybe  . Coronary artery disease   . Depression   . Diverticular disease   . Esophageal reflux   . Falls frequently   . Headache(784.0)    takes Prilosec  . Hypertension   . IBS (irritable bowel syndrome)   . Osteoarthrosis, unspecified whether generalized or localized, lower leg   . Other and unspecified hyperlipidemia   . Shortness of breath    when my heart is not doing right  . Takotsubo syndrome   . Wrist fracture     Patient Active Problem List   Diagnosis Date Noted  . Colitis 12/18/2016  . Closed fracture of right distal radius 11/14/2016  . Altered mental status   . Leucocytosis 11/10/2016  . Traumatic closed nondisp torus fracture of distal radial metaphysis, right, sequela 11/10/2016  . Chronic diastolic heart failure ([place]) 11/10/2016  . Hypoxia 11/09/2016  . Dementia 11/09/2016  . Debility 11/09/2016  . Anxiety state 11/23/2014  . Generalized anxiety disorder 11/22/2014  . Chronic diarrhea 11/22/2014  . Positive D dimer   . Essential hypertension 11/21/2014  . CAD (coronary artery disease) 11/21/2014  . Fall   . Acute respiratory failure with hypoxia and hypercapnia (HCC)   . Syncope 11/20/2014  . Lumbar compression fracture ([place]) 11/05/2013  . Thrombocytosis ([place]) 06/21/2013  . Acute delirium 06/15/2013    . Hypokalemia 06/14/2013  . Acute blood loss anemia 06/11/2013  . Protein-calorie malnutrition, severe ([place]) 06/10/2013  . Dehydration 06/09/2013  . GAD (generalized anxiety disorder) 06/09/2013  . HYPERLIPIDEMIA 05/25/2009  . TAKOTSUBO SYNDROME 05/25/2009  . GERD 05/25/2009  . DIVERTICULAR DISEASE 05/25/2009  . OSTEOARTHRITIS, KNEE, RIGHT 05/25/2009  . IRRITABLE BOWEL SYNDROME, HX OF 05/25/2009    Past Surgical History:  Procedure Laterality Date  . BACK SURGERY    . BREAST RECONSTRUCTION    . CATARACT EXTRACTION W/ INTRAOCULAR LENS  IMPLANT, BILATERAL    . FRACTURE SURGERY    . HIP ARTHROPLASTY Right 06/10/2013   Procedure: ARTHROPLASTY MONOPOLAR HIP CEMENTED;  Surgeon: Marybelle Killings, MD;  Location: [facility];  Service: Orthopedics;  Laterality: Right;  . HYSTEROTOMY    . IMPLANTABLE CONTACT LENS IMPLANTATION    . JOINT REPLACEMENT    . KNEE SURGERY    . KYPHOPLASTY N/A 11/05/2013   Procedure: Jone Baseman TWO;  Surgeon: Ophelia Charter, MD;  Location: [facility];  Service: Neurosurgery;  Laterality: N/A;  . OPEN REDUCTION INTERNAL FIXATION (ORIF) DISTAL RADIAL FRACTURE Right 11/14/2016   Procedure: OPEN REDUCTION INTERNAL FIXATION (ORIF) DISTAL RADIAL FRACTURE, with repair as indicated;  Surgeon: Iran Planas, MD;  Location: [facility];  Service: Orthopedics;  Laterality: Right;  . TONSILLECTOMY     as child    OB History    No data available  Home Medications    Prior to Admission medications   Medication Sig Start Date End Date Taking? Authorizing Provider  acetaminophen (TYLENOL) 500 MG tablet Take 1,000 mg by mouth 3 (three) times daily.   Yes [provider]  ALPRAZolam (XANAX) 0.5 MG tablet Take 1 tablet (0.5 mg total) by mouth 3 (three) times daily as needed for anxiety. Patient taking differently: Take 0.5 mg by mouth 2 (two) times daily.  11/10/16  Yes Annita Brod, MD  Amino Acids-Protein Hydrolys (FEEDING SUPPLEMENT, PRO-STAT SUGAR FREE  64,) LIQD Take 30 mLs by mouth daily.   Yes [provider]  aspirin 81 MG EC tablet Take 81 mg by mouth daily. Swallow whole.   Yes [provider]  Calcium Citrate-Vitamin D (CALCIUM + D PO) Take 1 tablet by mouth daily.   Yes [provider]  carvedilol (COREG) 3.125 MG tablet TAKE 1 TABLET BY MOUTH TWICE A DAY WITH A MEAL 05/15/16  Yes Sherren Mocha, MD  CRANBERRY PO Take 2 capsules by mouth daily.   Yes [provider]  diphenoxylate-atropine (LOMOTIL) 2.5-0.025 MG per tablet Take 1 tablet by mouth 2 (two) times daily.  04/27/14  Yes [provider]  Docusate Calcium (STOOL SOFTENER PO) Take 1 tablet by mouth daily as needed (constipation).    Yes [provider]  Multiple Vitamin (MULTIVITAMIN) tablet Take 1 tablet by mouth daily.   Yes [provider]  nitroGLYCERIN (NITROSTAT) 0.4 MG SL tablet Place 1 tablet (0.4 mg total) under the tongue every 5 (five) minutes as needed. For chest pain. 04/22/13  Yes Sherren Mocha, MD  omeprazole (PRILOSEC) 20 MG capsule Take 20 mg by mouth daily.     Yes [provider]  polyethylene glycol (MIRALAX / GLYCOLAX) packet Take 17 g by mouth daily as needed for mild constipation. 11/10/16  Yes Annita Brod, MD  potassium chloride SA (K-DUR,KLOR-CON) 20 MEQ tablet Take 20 mEq by mouth 2 (two) times daily.   Yes [provider]  Probiotic Product (PROBIOTIC DAILY PO) Take 1 tablet by mouth daily.   Yes [provider]  QUEtiapine (SEROQUEL) 50 MG tablet Take 50 mg by mouth 2 (two) times daily.    Yes [provider]  sertraline (ZOLOFT) 50 MG tablet Take 50 mg by mouth daily.   Yes [provider]  traMADol (ULTRAM) 50 MG tablet Take 25 mg by mouth 2 (two) times daily.   Yes [provider]  trolamine salicylate (ASPERCREME) 10 % cream Apply 1 application topically as needed (for back pain).    Yes [provider]  UNABLE TO FIND  Med Name: Med pass 120 mL by mouth 3 times daily   Yes [provider]  vitamin B-12 (CYANOCOBALAMIN) 1000 MCG tablet Take 1,000 mcg by mouth daily.   Yes [provider]  ciprofloxacin (CIPRO) 500 MG tablet Take 1 tablet (500 mg total) by mouth 2 (two) times daily. One po bid x 7 days 12/18/16   Charlesetta Shanks, MD  metroNIDAZOLE (FLAGYL) 500 MG tablet Take 1 tablet (500 mg total) by mouth 2 (two) times daily. One po bid x 7 days 12/18/16   Charlesetta Shanks, MD  traMADol (ULTRAM) 50 MG tablet Take 1 tablet (50 mg total) by mouth every 6 (six) hours as needed. 12/18/16   Charlesetta Shanks, MD    Family History Family History  Problem Relation Age of Onset  . Heart failure Mother   . Stroke Sister   .  Lung cancer Brother   . Lung cancer Other     Social History Social History  Substance Use Topics  . Smoking status: Never Smoker  . Smokeless tobacco: Never Used  . Alcohol use No     Allergies   Adhesive [tape] and Sulfamethoxazole-trimethoprim   Review of Systems Review of Systems 10 Systems reviewed and are negative for acute change except as noted in the HPI.   Physical Exam Updated Vital Signs BP 128/76 (BP Location: Right Arm)   Pulse 75   Temp 97.6 F (36.4 C) (Oral)   Resp 18   SpO2 100%   Physical Exam  Constitutional: She is oriented to person, place, and time. She appears well-developed and well-nourished. No distress.  HENT:  Head: Normocephalic and atraumatic.  Mouth/Throat: Oropharynx is clear and moist.  Eyes: Conjunctivae and EOM are normal.  Neck: Neck supple.  Cardiovascular: Normal rate and regular rhythm.   No murmur heard. Pulmonary/Chest: Effort normal and breath sounds normal. No respiratory distress.  Abdominal: Soft. There is tenderness.  Moderate to severe mid and epigastric pain to palpation. No guarding.  Musculoskeletal: She exhibits no edema.  Neurological: She is alert and oriented to person, place, and time. She exhibits  normal muscle tone. Coordination normal.  Skin: Skin is warm and dry. There is pallor.  Psychiatric: She has a normal mood and affect.  Nursing note and vitals reviewed.    ED Treatments / Results  Labs (all labs ordered are listed, but only abnormal results are displayed) Labs Reviewed  COMPREHENSIVE METABOLIC PANEL - Abnormal; Notable for the following:       Result Value   Glucose, Bld 118 (*)    ALT 13 (*)    Alkaline Phosphatase 184 (*)    All other components within normal limits  CBC - Abnormal; Notable for the following:    RBC 3.69 (*)    Hemoglobin 11.5 (*)    HCT 33.8 (*)    All other components within normal limits  URINALYSIS, ROUTINE W REFLEX MICROSCOPIC - Abnormal; Notable for the following:    Color, Urine STRAW (*)    Glucose, UA 50 (*)    Hgb urine dipstick SMALL (*)    Ketones, ur 5 (*)    Bacteria, UA RARE (*)    All other components within normal limits  CULTURE, BLOOD (ROUTINE X 2)  CULTURE, BLOOD (ROUTINE X 2)  GASTROINTESTINAL PANEL BY PCR, STOOL (REPLACES STOOL CULTURE)  C DIFFICILE QUICK SCREEN W PCR REFLEX  LIPASE, BLOOD  LACTIC ACID, PLASMA  LACTIC ACID, PLASMA    EKG  EKG Interpretation None       Radiology Ct Abdomen Pelvis W Contrast  Result Date: 12/18/2016 CLINICAL DATA:  82 year old female with abdominal pain. EXAM: CT ABDOMEN AND PELVIS WITH CONTRAST TECHNIQUE: Multidetector CT imaging of the abdomen and pelvis was performed using the standard protocol following bolus administration of intravenous contrast. CONTRAST:  44mL ISOVUE-300 IOPAMIDOL (ISOVUE-300) INJECTION 61%, 59mL ISOVUE-300 IOPAMIDOL (ISOVUE-300) INJECTION 61% COMPARISON:  CT of the pelvis dated 11/20/2014. FINDINGS: Evaluation is limited due to streak artifact caused by metallic spinal and right hip hardware. Lower chest: Minimal bibasilar linear atelectasis/ scarring. The visualized lung bases are otherwise clear. There is no intra-abdominal free air or free fluid.  Hepatobiliary: The liver is unremarkable. No intrahepatic biliary ductal dilatation. The gallbladder is unremarkable. Pancreas: Unremarkable. No pancreatic ductal dilatation or surrounding inflammatory changes. Spleen: Normal in size without focal abnormality. Adrenals/Urinary Tract: The adrenal glands,  kidneys, and the urinary bladder appear unremarkable. Probable 5 mm right renal cyst versus dilated calyx. Stomach/Bowel: There is no evidence of bowel obstruction. There is thickened appearance of the colon with mild haziness of the fat surrounding the sigmoid colon concerning for colitis. Correlation with clinical exam and stool cultures recommended. The appendix is not visualized with certainty. No inflammatory changes identified in the right lower quadrant. Vascular/Lymphatic: There is advanced aortoiliac atherosclerotic disease. The IVC is grossly unremarkable. No portal venous gas identified. There is no adenopathy. Reproductive: Hysterectomy.  No pelvic mass. Other: Bilateral breast implants. Musculoskeletal: There is osteopenia with degenerative changes of the spine. Multilevel old-appearing compression deformities. L2 vertebroplasty changes. L4-L5 disc spacer and posterior fusion hardware. There is a right hip arthroplasty. There is fracture of the right inferior pubic ramus, likely subacute or chronic with nonunion. There is heterogeneity of the sacral bone and age indeterminate fractures of the bilateral sacral alum, likely subacute or chronic. IMPRESSION: 1. Diffuse thickened appearance of the colon concerning for colitis. Correlation with clinical exam and stool cultures recommended. No bowel obstruction. 2. Advanced osteopenia with old appearing fractures of the lumbar spine, right inferior pubic ramus, and bilateral sacral alum. Clinical correlation is recommended. 3. Atherosclerotic aorta. Electronically Signed   By: Anner Crete M.D.   On: 12/18/2016 22:21    Procedures Procedures (including  critical care time)  Medications Ordered in ED Medications  iopamidol (ISOVUE-300) 61 % injection (not administered)  pantoprazole (PROTONIX) injection 40 mg (40 mg Intravenous Given 12/18/16 1947)  0.9 %  sodium chloride infusion ( Intravenous Stopped 12/18/16 2340)  iopamidol (ISOVUE-300) 61 % injection 100 mL (80 mLs Intravenous Contrast Given 12/18/16 2101)  iopamidol (ISOVUE-300) 61 % injection 30 mL (30 mLs Oral Contrast Given 12/18/16 2000)  ciprofloxacin (CIPRO) tablet 500 mg (500 mg Oral Given 12/18/16 2340)  metroNIDAZOLE (FLAGYL) tablet 500 mg (500 mg Oral Given 12/18/16 2340)  traMADol (ULTRAM) tablet 50 mg (50 mg Oral Given 12/18/16 2340)     Initial Impression / Assessment and Plan / ED Course  I have reviewed the triage vital signs and the nursing notes.  Pertinent labs & imaging results that were available during my care of the patient were reviewed by me and considered in my medical decision making (see chart for details).      Final Clinical Impressions(s) / ED Diagnoses   Final diagnoses:  Colitis   CT shows some diffuse wall thickening consistent with colitis. Patient does report intermittent diarrhea. She is alert and appropriate. We discussed admission for pain control and initiating treatment with night observation. Patient reports that she did not want to stay in the hospital if she could possibly take her medications at home. Patient does not have leukocytosis, vital signs are stable, she has not had active vomiting. At this time she will be allowed to return home but has been given strict return precautions. The patient's daughter will come to get her. New Prescriptions New Prescriptions   CIPROFLOXACIN (CIPRO) 500 MG TABLET    Take 1 tablet (500 mg total) by mouth 2 (two) times daily. One po bid x 7 days   METRONIDAZOLE (FLAGYL) 500 MG TABLET    Take 1 tablet (500 mg total) by mouth 2 (two) times daily. One po bid x 7 days   TRAMADOL (ULTRAM) 50 MG TABLET    Take  1 tablet (50 mg total) by mouth every 6 (six) hours as needed.     Charlesetta Shanks, MD 12/18/16 2350

## 2016-12-18 NOTE — ED Notes (Signed)
Pt on the commode.

## 2016-12-18 NOTE — ED Notes (Signed)
Daughter called voicing concerns that pt took meds without food and could be cause of pain.

## 2016-12-18 NOTE — ED Notes (Signed)
Pt has had two spells of diarrhea in the last 30 mins.

## 2016-12-18 NOTE — ED Notes (Signed)
Patient transported to CT 

## 2016-12-18 NOTE — ED Notes (Signed)
Bladder scan read 63mLs.

## 2016-12-18 NOTE — ED Notes (Signed)
Patient voided 150 ml.

## 2016-12-18 NOTE — ED Triage Notes (Signed)
Per [facility] pt from home, called out due to abdominal pain for past week worsening today. Pt had diarrhea for past week, but none today. Denies any emesis. EMS adds pt lost husband about 7 months ago and is still very upset from that.

## 2016-12-18 NOTE — ED Notes (Signed)
Pt assisted to restroom with small amount loose stool in commode.

## 2016-12-19 NOTE — ED Notes (Signed)
Pt unable to provide stool sample.

## 2017-02-01 NOTE — Anesthesia Postprocedure Evaluation (Signed)
Anesthesia Post Note  Patient: [name]  Procedure(s) Performed: Procedure(s) (LRB): OPEN REDUCTION INTERNAL FIXATION (ORIF) DISTAL RADIAL FRACTURE, with repair as indicated (Right)     Anesthesia Post Evaluation  Last Vitals:  Vitals:   11/15/16 1358 11/15/16 2100  BP: 126/62 138/78  Pulse: 71 79  Resp: 16 18  Temp: 37.2 C 37.2 C    Last Pain:  Vitals:   11/15/16 2100  TempSrc: Oral  PainSc:                  Riccardo Dubin

## 2017-02-01 NOTE — Addendum Note (Signed)
Addendum  created 02/01/17 1202 by Lyndle Herrlich, MD   Sign clinical note

## 2017-12-12 ENCOUNTER — Telehealth: Payer: Self-pay | Admitting: Cardiovascular Disease

## 2017-12-12 NOTE — Telephone Encounter (Signed)
   [facility] Pre-operative Risk Assessment    Request for surgical clearance:  1. What type of surgery is being performed? Left thumb mass excision   2. When is this surgery scheduled?  01/01/18   3. What type of clearance is required (medical clearance vs. Pharmacy clearance to hold med vs. Both)?  Cardiac  4. Are there any medications that need to be held prior to surgery and how long? None listed   5. Practice name and name of physician performing surgery?  [facility]- Dr. Caralyn Guile   6. What is your office phone number? [phone]    7.   What is your office fax number? [place]  8.   Anesthesia type (None, local, MAC, general) ?  Local with IV sedation    _________________________________________________________________   (provider comments below)

## 2017-12-12 NOTE — Telephone Encounter (Signed)
   Primary [place], MD  Chart reviewed as part of pre-operative protocol coverage. Because of [name]'s past medical history and time since last visit, he/she will require a follow-up visit in order to better assess preoperative cardiovascular risk.  Pre-op covering staff: - Please schedule appointment and call patient to inform them. - Please contact requesting surgeon's office via preferred method (i.e, phone, fax) to inform them of need for appointment prior to surgery.  [place], [place]  12/12/2017, 5:42 PM

## 2017-12-13 NOTE — Telephone Encounter (Signed)
SPOKE WITH DAUGHTER WHO WAS AGREEABLE TO [facility] APPT  12-31-17 WITH GERHARDT

## 2017-12-23 ENCOUNTER — Telehealth: Payer: Self-pay

## 2017-12-23 NOTE — Telephone Encounter (Signed)
This patient needs an office visit before cardiac clearance. This is scheduled for 12/31/17.   Kerin Ransom PA-C 12/23/2017 3:31 PM

## 2017-12-23 NOTE — Telephone Encounter (Signed)
   [facility] Pre-operative Risk Assessment    Request for surgical clearance:  1. What type of surgery is being performed? Left Thumb Mass Excision   2. When is this surgery scheduled? 01/01/18   3. What type of clearance is required (medical clearance vs. Pharmacy clearance to hold med vs. Both)? Medical and Pharmacy  4. Are there any medications that need to be held prior to surgery and how long? Aspirin    5. Practice name and name of physician performing surgery? [facility]/ Dr. Iran Planas    6. What is your office phone number [phone]    7.   What is your office fax number          [phone]  8.   Anesthesia type (None, local, MAC, general) ? Local with IV sedation

## 2017-12-31 ENCOUNTER — Ambulatory Visit: Payer: Medicare Other | Admitting: Nurse Practitioner

## 2017-12-31 ENCOUNTER — Encounter: Payer: Self-pay | Admitting: Nurse Practitioner

## 2017-12-31 VITALS — BP 126/70 | HR 71 | Ht <= 58 in | Wt 116.1 lb

## 2017-12-31 DIAGNOSIS — Z01810 Encounter for preprocedural cardiovascular examination: Secondary | ICD-10-CM

## 2017-12-31 LAB — CBC
Hematocrit: 29 % — ABNORMAL LOW (ref 34.0–46.6)
Hemoglobin: 9.7 g/dL — ABNORMAL LOW (ref 11.1–15.9)
MCH: 28.7 pg (ref 26.6–33.0)
MCHC: 33.4 g/dL (ref 31.5–35.7)
MCV: 86 fL (ref 79–97)
Platelets: 317 10*3/uL (ref 150–450)
RBC: 3.38 x10E6/uL — ABNORMAL LOW (ref 3.77–5.28)
RDW: 13.2 % (ref 12.3–15.4)
WBC: 4.7 10*3/uL (ref 3.4–10.8)

## 2017-12-31 LAB — BASIC METABOLIC PANEL
BUN/Creatinine Ratio: 17 (ref 12–28)
BUN: 14 mg/dL (ref 8–27)
CO2: 25 mmol/L (ref 20–29)
Calcium: 9 mg/dL (ref 8.7–10.3)
Chloride: 92 mmol/L — ABNORMAL LOW (ref 96–106)
Creatinine, Ser: 0.84 mg/dL (ref 0.57–1.00)
GFR calc Af Amer: 76 mL/min/{1.73_m2} (ref >59)
GFR calc non Af Amer: 66 mL/min/{1.73_m2} (ref >59)
Glucose: 88 mg/dL (ref 65–99)
Potassium: 4.5 mmol/L (ref 3.5–5.2)
Sodium: 132 mmol/L — ABNORMAL LOW (ref 134–144)

## 2017-12-31 NOTE — Progress Notes (Signed)
CARDIOLOGY OFFICE NOTE  Date:  12/31/2017    [name] Date of Birth: 21-Jul-1935 Medical Record #[id number]  PCP:  Lajean Manes, MD  Cardiologist:  Burt Knack    Chief Complaint  Patient presents with  . Pre-op Exam    Seen for Dr. Burt Knack    History of Present Illness: [name] is a 83 y.o. female who presents today for a pre op visit. Seen for Dr. Burt Knack.  She has a history of remote Takotsubo's cardiomyopathy. Her acute event occurred in 2007. Cardiac catheterization demonstrated mild nonobstructive CAD. A followup echocardiogram in December 2009 demonstrated normal left ventricular systolic function with an ejection fraction of 55-60% and no regional wall motion abnormalities. A nuclear scan in 2012 showed no ischemia. The gated LVEF was 82%. Other issues as noted below.   The patient was hospitalized for an episode of syncope in May 2016. At the time she was trying to wean herself off of alprazolam and there is some question of whether she may have had a withdrawal seizure.  She was last seen here in November of 2016 - cardiac status was felt to be ok. Noted that she has had no symptoms of coronary ischemia over the prior 8 to 9 years. Gait unsteady. On chronic narcotics and anxiolytics.   Hospitalized back in April of 2018 with right wrist fracture - complicated by acute encephalopathy due to delirium post op. Echo was obtained - EF remains normal.   Now needing clearance for excision of a thumb mass as well as instructions on holding aspirin. Due to last visit being in 2016 - visit was required here.   Comes in today. Here with her daughter. She now lives with her daughter. This appointment is too early of an appointment for her. She is sleepy.  Lots of depression - hospitalized at [facility] in [place] back in February - her medicines were changed.  Her daughter already stopped her aspirin as of last Friday - surgery is actually tomorrow. No cardiac symptoms -  denies chest pain, shortness of breath or syncope. Seems more limited by depression. Sits most of the day. Not active at all.   Past Medical History:  Diagnosis Date  . Anemia   . Anxiety    confusion  . Blood transfusion    2005 maybe  . Coronary artery disease   . Depression   . Diverticular disease   . Esophageal reflux   . Falls frequently   . Headache(784.0)    takes Prilosec  . Hypertension   . IBS (irritable bowel syndrome)   . Osteoarthrosis, unspecified whether generalized or localized, lower leg   . Other and unspecified hyperlipidemia   . Shortness of breath    when my heart is not doing right  . Takotsubo syndrome   . Wrist fracture     Past Surgical History:  Procedure Laterality Date  . BACK SURGERY    . BREAST RECONSTRUCTION    . CATARACT EXTRACTION W/ INTRAOCULAR LENS  IMPLANT, BILATERAL    . FRACTURE SURGERY    . HIP ARTHROPLASTY Right 06/10/2013   Procedure: ARTHROPLASTY MONOPOLAR HIP CEMENTED;  Surgeon: Marybelle Killings, MD;  Location: [facility];  Service: Orthopedics;  Laterality: Right;  . HYSTEROTOMY    . IMPLANTABLE CONTACT LENS IMPLANTATION    . JOINT REPLACEMENT    . KNEE SURGERY    . KYPHOPLASTY N/A 11/05/2013   Procedure: Jone Baseman TWO;  Surgeon: Ophelia Charter, MD;  Location: [facility]  NEURO ORS;  Service: Neurosurgery;  Laterality: N/A;  . OPEN REDUCTION INTERNAL FIXATION (ORIF) DISTAL RADIAL FRACTURE Right 11/14/2016   Procedure: OPEN REDUCTION INTERNAL FIXATION (ORIF) DISTAL RADIAL FRACTURE, with repair as indicated;  Surgeon: Iran Planas, MD;  Location: [facility];  Service: Orthopedics;  Laterality: Right;  . TONSILLECTOMY     as child     Medications: Current Meds  Medication Sig  . acetaminophen (TYLENOL) 500 MG tablet Take 1,000 mg by mouth 3 (three) times daily.  . Amino Acids-Protein Hydrolys (FEEDING SUPPLEMENT, PRO-STAT SUGAR FREE 64,) LIQD Take 30 mLs by mouth daily.  Marland Kitchen aspirin 81 MG EC tablet Take 81 mg by mouth daily. Swallow  whole.  Marland Kitchen atorvastatin (LIPITOR) 20 MG tablet Take 20 mg by mouth at bedtime.  . Calcium Citrate-Vitamin D (CALCIUM + D PO) Take 1 tablet by mouth daily.  . carvedilol (COREG) 3.125 MG tablet TAKE 1 TABLET BY MOUTH TWICE A DAY WITH A MEAL  . CRANBERRY PO Take 2 capsules by mouth daily.  . divalproex (DEPAKOTE) 125 MG DR tablet Take 125 mg by mouth 2 (two) times daily.  Mariane Baumgarten Calcium (STOOL SOFTENER PO) Take 1 tablet by mouth daily as needed (constipation).   . Lido-Capsaicin-Men-Methyl Sal (MEDI-PATCH-LIDOCAINE EX) Apply 1 patch topically every 12 (twelve) hours.  Marland Kitchen loperamide (IMODIUM) 2 MG capsule Take 2 mg by mouth as needed for diarrhea or loose stools.  . mirtazapine (REMERON) 15 MG tablet Take 15 mg by mouth at bedtime.  . Multiple Vitamin (MULTIVITAMIN) tablet Take 1 tablet by mouth daily.  . nitroGLYCERIN (NITROSTAT) 0.4 MG SL tablet Place 1 tablet (0.4 mg total) under the tongue every 5 (five) minutes as needed. For chest pain.  Marland Kitchen omeprazole (PRILOSEC) 20 MG capsule Take 20 mg by mouth daily.    . polyethylene glycol (MIRALAX / GLYCOLAX) packet Take 17 g by mouth daily as needed for mild constipation. (Patient taking differently: Take 17 g by mouth daily as needed for mild constipation.)  . potassium chloride SA (K-DUR,KLOR-CON) 20 MEQ tablet Take 20 mEq by mouth 2 (two) times daily.   . QUEtiapine (SEROQUEL) 50 MG tablet Take 50 mg by mouth 2 (two) times daily.   . sertraline (ZOLOFT) 50 MG tablet Take 50 mg by mouth daily.   Marland Kitchen trolamine salicylate (ASPERCREME) 10 % cream Apply 1 application topically as needed (for back pain).   Marland Kitchen UNABLE TO FIND Med Name: Med pass 120 mL by mouth 3 times daily  . [DISCONTINUED] Probiotic Product (PROBIOTIC DAILY PO) Take 1 tablet by mouth daily.   . [DISCONTINUED] traMADol (ULTRAM) 50 MG tablet Take 25 mg by mouth 2 (two) times daily.   . [DISCONTINUED] vitamin B-12 (CYANOCOBALAMIN) 1000 MCG tablet Take 1,000 mcg by mouth daily.      Allergies: Allergies  Allergen Reactions  . Adhesive [Tape] Other (See Comments)    Sensitive to adhesives, must be removed with adhesive remover due to thin skin.  Octaviano Glow Hives    Social History: The patient  reports that she has never smoked. She has never used smokeless tobacco. She reports that she does not drink alcohol or use drugs.   Family History: The patient's family history includes Heart failure in her mother; Lung cancer in her brother and other; Stroke in her sister.   Review of Systems: Please see the history of present illness.   Otherwise, the review of systems is positive for none.   All other systems are reviewed and negative.  Physical Exam: VS:  BP 126/70 (BP Location: Left Arm, Patient Position: Sitting, Cuff Size: Normal)   Pulse 71   Ht 4\' 8"  (1.422 m)   Wt 116 lb 1.9 oz (52.7 kg)   BMI 26.03 kg/m  .  BMI Body mass index is 26.03 kg/m.  Wt Readings from Last 3 Encounters:  12/31/17 116 lb 1.9 oz (52.7 kg)  12/11/16 105 lb 12.8 oz (48 kg)  11/22/16 109 lb (49.4 kg)    General: Elderly. Quite thin. Chronically ill appearing and frail but alert and in no acute distress. Using a walker. She is quite pale.  HEENT: Normal.  Neck: Supple, no JVD, carotid bruits, or masses noted.  Cardiac: Regular rate and rhythm. No murmurs, rubs, or gallops. No edema.  Respiratory:  Lungs are clear to auscultation bilaterally with normal work of breathing.  GI: Soft and nontender.  MS: No deformity or atrophy. Gait and ROM intact. Using a walker. Moving slow. Quite kyphotic.  Skin: Warm and dry. Color is pale.  Neuro:  Strength and sensation are intact and no gross focal deficits noted.  Psych: Alert, appropriate and with normal affect.   LABORATORY DATA:  EKG:  EKG is ordered today. This demonstrates NSR.  Her labs are noted in [facility] from February - she is noted to be anemic. HGB 9.7  Lab Results  Component Value Date    WBC 7.7 12/18/2016   HGB 11.5 (L) 12/18/2016   HCT 33.8 (L) 12/18/2016   PLT 315 12/18/2016   GLUCOSE 118 (H) 12/18/2016   CHOL 167 02/15/2011   TRIG 82 02/15/2011   HDL 59 02/15/2011   LDLCALC 92 02/15/2011   ALT 13 (L) 12/18/2016   AST 23 12/18/2016   NA 138 12/18/2016   K 3.8 12/18/2016   CL 104 12/18/2016   CREATININE 0.69 12/18/2016   BUN 6 12/18/2016   CO2 22 12/18/2016   TSH 2.539 11/14/2016   INR 1.27 06/17/2013   HGBA1C 5.2 11/10/2016     BNP (last 3 results) No results for input(s): BNP in the last 8760 hours.  ProBNP (last 3 results) No results for input(s): PROBNP in the last 8760 hours.   Other Studies Reviewed Today:  Echo Study Conclusions 10/2016  - Left ventricle: The cavity size was normal. There was mild focal   basal hypertrophy of the septum. Systolic function was normal.   The estimated ejection fraction was in the range of 60% to 65%.   Wall motion was normal; there were no regional wall motion   abnormalities. Doppler parameters are consistent with abnormal   left ventricular relaxation (grade 1 diastolic dysfunction). - Aortic valve: Mildly calcified annulus. Trileaflet; mildly   calcified leaflets. - Mitral valve: Calcified annulus. There was trivial regurgitation. - Right atrium: Central venous pressure (est): 3 mm Hg. - Tricuspid valve: There was mild regurgitation. - Pulmonary arteries: PA peak pressure: 25 mm Hg (S). - Pericardium, extracardiac: A small pericardial effusion was   identified anterior to the heart.  Impressions:  - Mild basal septal LV hypertrophy with LVEF 60-65% and grade 1   diastolic dysfunction. Mildly calcified mitral and aortic   annulus. Trivial mitral and mild tricuspid regurgitation. Small   anterior pericardial effusion.    ASSESSMENT AND PLAN:  1. Pre op clearance - low risk surgery of the left thumb - to be under local anesthesia. She would be at increased risk due to marked sedentary lifestyle.  Has had post op delirium in  the past. Aspirin already held by daughter. Will be available as needed. No current cardiac issues/symptoms noted. Seems more limited by her depression/psyche issues.   2. Remote history of Takotsubo's Syndrome - last EF from 2018 still well preserved.   3. Remote syncope - polypharmacy suspected - this has not recurred.   4. Anemia - needs labs rechecked.   5. Significant depression.   Current medicines are reviewed with the patient today.  The patient does not have concerns regarding medicines other than what has been noted above.  The following changes have been made:  See above.  Labs/ tests ordered today include:    Orders Placed This Encounter  Procedures  . EKG 12-Lead     Disposition:   FU with Dr. Burt Knack in one year.  Patient is agreeable to this plan and will call if any problems develop in the interim.   SignedTruitt Merle, NP  12/31/2017 8:47 AM  [facility] [address] [place], [place]  [address] Phone: [phone] Fax: [phone]

## 2017-12-31 NOTE — Patient Instructions (Addendum)
We will be checking the following labs today - BMET & CBC   Medication Instructions:    Continue with your current medicines.     Testing/Procedures To Be Arranged:  N/A  Follow-Up:   See Dr. Burt Knack in one year    Other Special Instructions:   I will send a note to Dr. Caralyn Guile    If you need a refill on your cardiac medications before your next appointment, please call your pharmacy.   Call the [place] office at [phone] if you have any questions, problems or concerns.

## 2018-10-04 IMAGING — CT CT ANGIO CHEST-ABD-PELV FOR DISSECTION W/ AND WO/W CM
2 of 7 series · 11 of 36 positions shown, 15 images · IV contrast (ISOVUE 370)
Comparison: None.

CLINICAL DATA: Syncope with hypertension and hypoxia. Evaluate for
aortic dissection.

EXAM:
CT ANGIOGRAPHY CHEST, ABDOMEN AND PELVIS
TECHNIQUE: Multidetector CT imaging through the chest, abdomen and pelvis was
performed using the standard protocol during bolus administration of
intravenous contrast. Multiplanar reconstructed images and MIPs were
obtained and reviewed to evaluate the vascular anatomy.
CONTRAST:  100 CC Isovue 370

[Series 6: arterial 3.0 b30f · axial · arterial · 0.65mm/px · z∈[+1145,+1598]mm · 10 of 175 slices shown, 13 images]
[im 12/175  mediastinal]
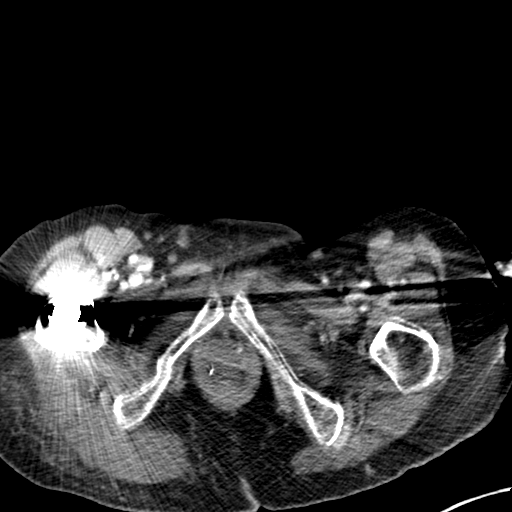
[im 12/175  bone]
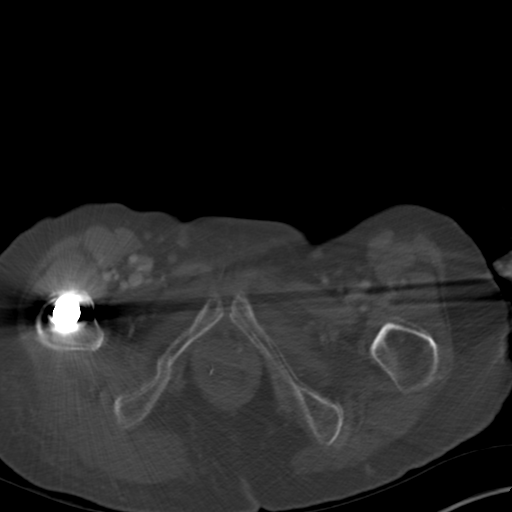
[im 35/175  mediastinal]
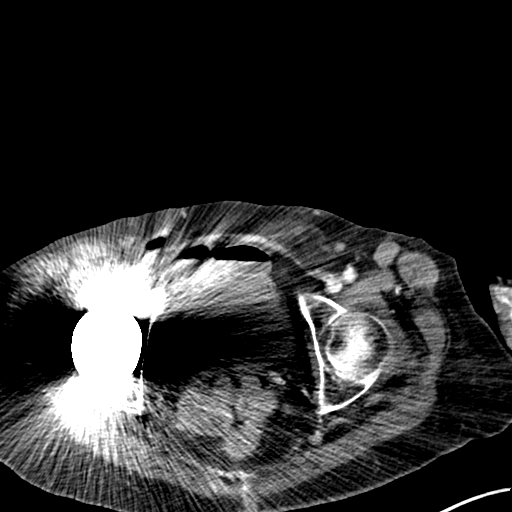
[im 59/175  mediastinal]
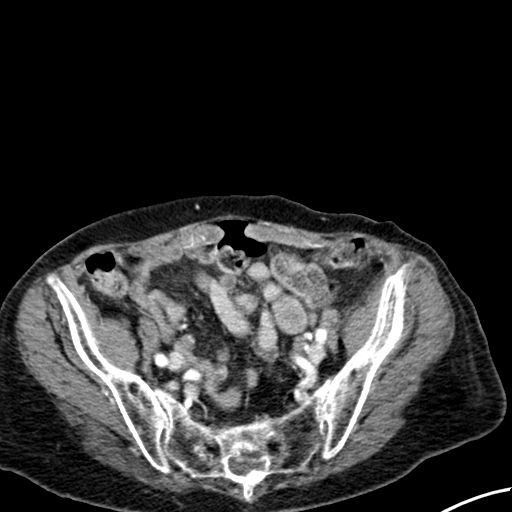
[im 82/175  mediastinal]
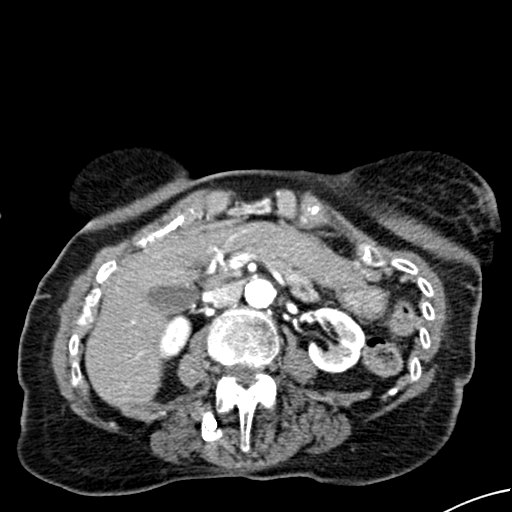
[im 93/175  mediastinal]
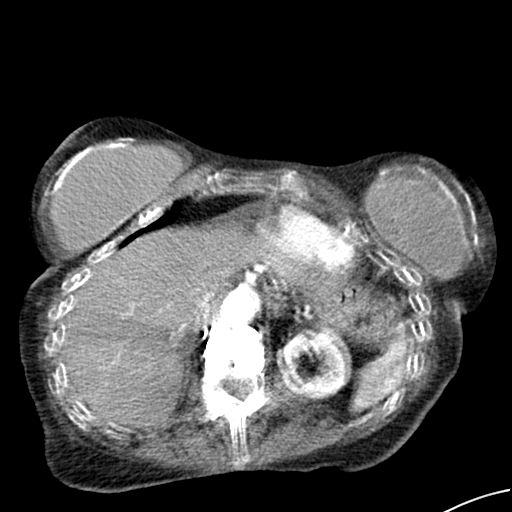
[im 117/175  mediastinal]
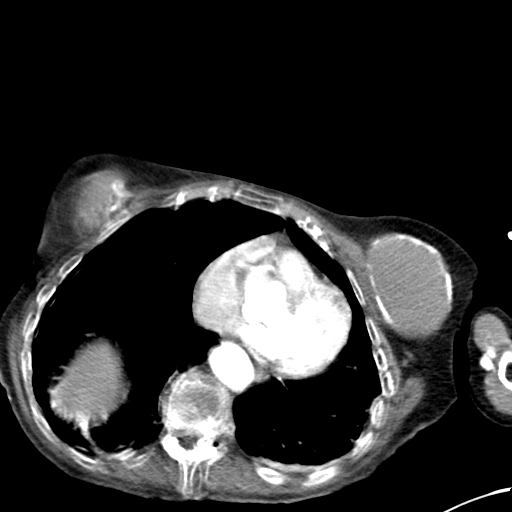
[im 128/175  lung]
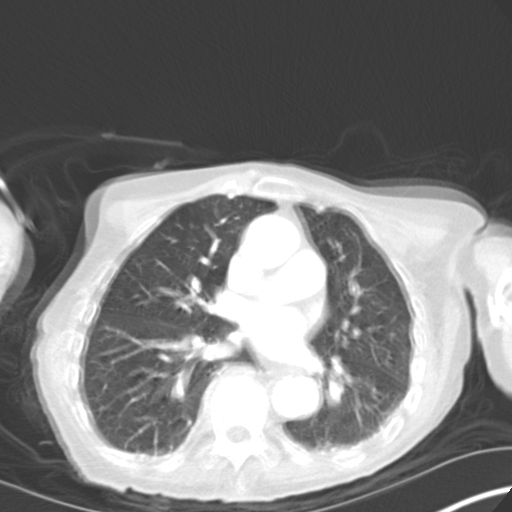
[im 140/175  mediastinal]
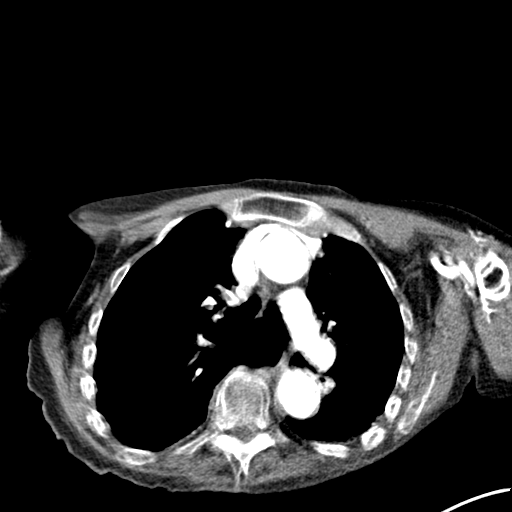
[im 140/175  lung]
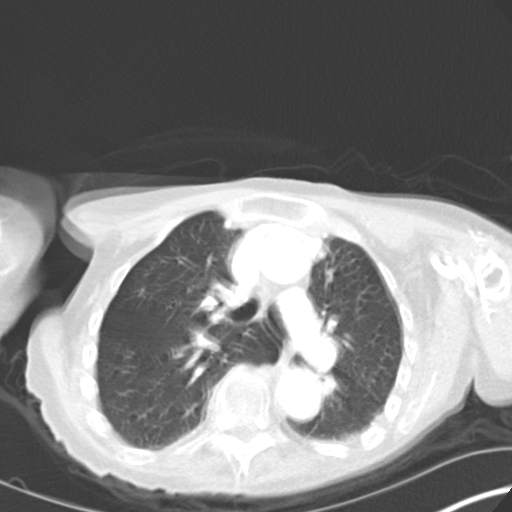
[im 151/175  lung]
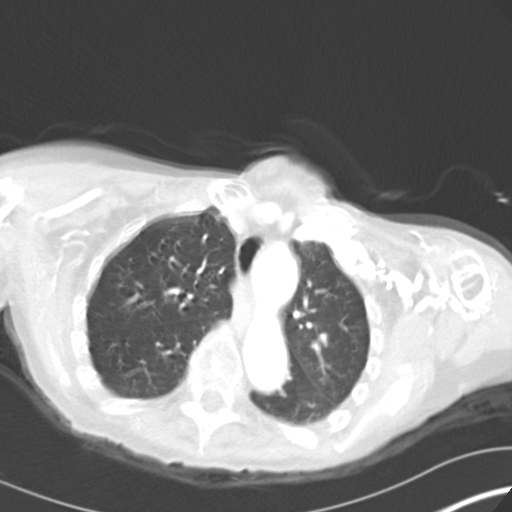
[im 163/175  mediastinal]
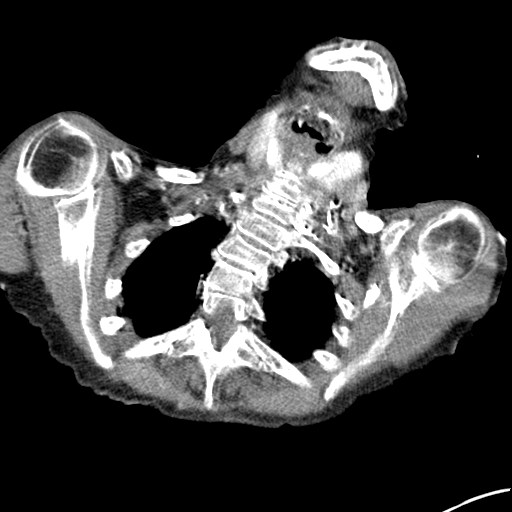
[im 163/175  lung]
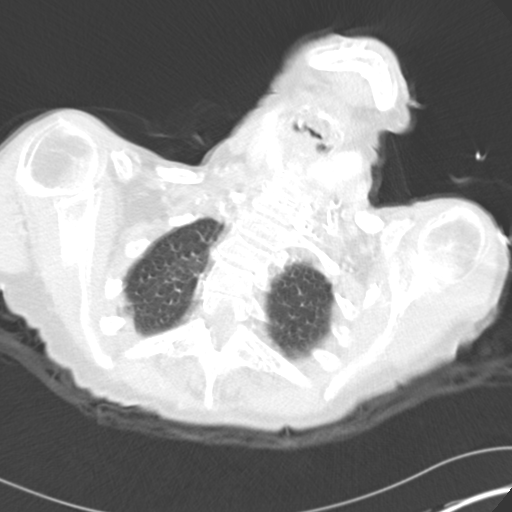

[Series 9: coronal mpr · coronal · 0.67mm/px · 1 of 123 slices shown, 2 images]
[im 62/123  mediastinal]
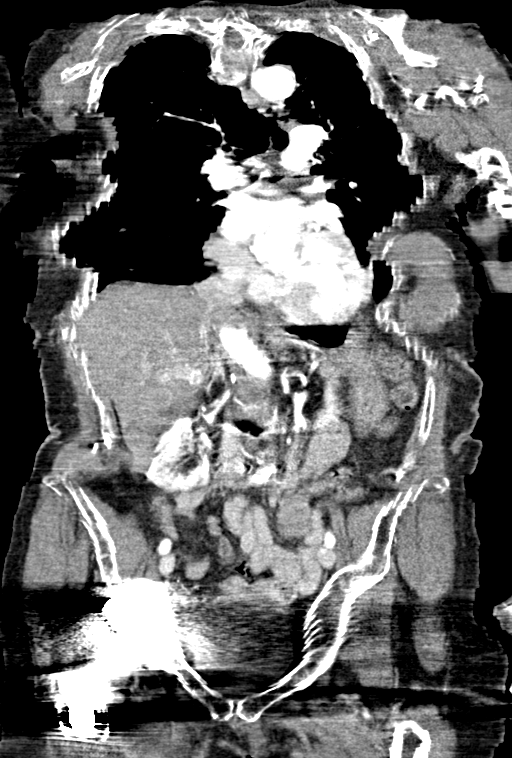
[im 62/123  bone]
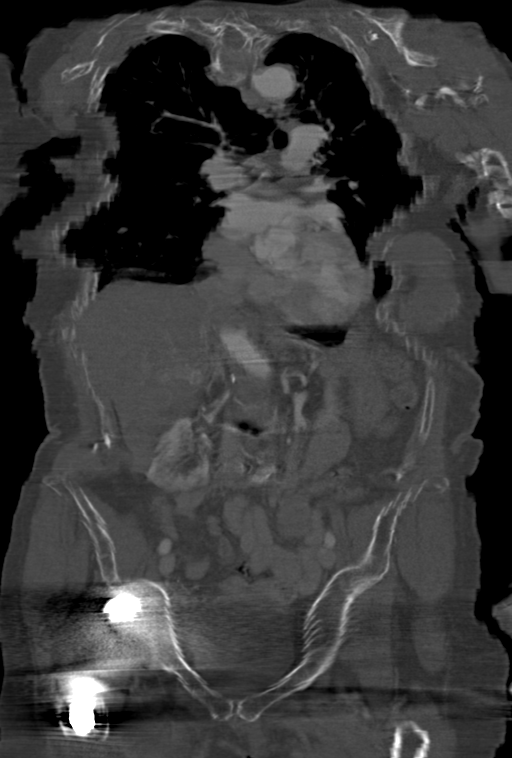

[11 of 36 positions shown; findings below may reference images not displayed]

FINDINGS: CTA CHEST FINDINGS

Study is markedly motion degraded.

Cardiovascular: Precontrast imaging shows no hyperdense crescent in
the wall of the thoracic aorta to suggest acute intramural hematoma.
Ascending thoracic aorta measures approximately 3.3 cm diameter.
Within the limitation of 9 gated technique and substantial patient
motion, no definite dissection flap is identified in the thoracic
aorta. Heart size is normal. No pericardial effusion. No large
central pulmonary embolus in the main pulmonary outflow tract or
either main pulmonary artery.

Mediastinum/Nodes: No mediastinal lymphadenopathy. There is no hilar
lymphadenopathy. There is no axillary lymphadenopathy.

Lungs/Pleura: No focal airspace consolidation. No overt pulmonary
edema. No substantial pleural effusion. Given the extreme motion
artifact, small pulmonary nodules could be obscured.

Musculoskeletal: No gross lytic or sclerotic osseous abnormality is
identified. Assessment of bony anatomy for fracture is markedly
limited by the motion artifact.

Review of the MIP images confirms the above findings.

CTA ABDOMEN AND PELVIS FINDINGS

VASCULAR

Aorta: No thoracic aortic aneurysm.

Celiac: Patent. Assessment for stenosis not possible due to motion
artifact.

SMA: Patent. Assessment for proximal stenosis not possible due to
motion artifact.

Renals: Patent bilaterally. Accessory left renal artery noted. Fine
detail obscured by motion artifact.

IMA: Not visualized but there is substantial motion artifact in this
region.

Veins: Portal vein and superior mesenteric vein are patent. IVC
appears patent although assessment is degraded by streak artifact
from spinal fixation hardware and motion artifact.

Review of the MIP images confirms the above findings.

NON-VASCULAR

Hepatobiliary: No gross abnormality in the liver parenchyma. There
is no evidence for gallstones, gallbladder wall thickening, or
pericholecystic fluid. No intrahepatic or extrahepatic biliary
dilation.

Pancreas: Limited assessment due to motion without focal abnormality
identified. No dilatation of the main pancreatic duct.

Spleen: Unremarkable.

Adrenals/Urinary Tract: No adrenal nodule or mass. No hydronephrosis
or gross renal mass evident. No evidence for hydroureter. Bladder is
obscured by streak artifact from right hip replacement.

Stomach/Bowel: Stomach is nondistended. Duodenum not well evaluated.
No evidence for small bowel obstruction. No evidence for colonic
obstruction. Bowel extends into a right groin hernia but cannot be
clearly discerned given streak artifact from the hip replacement and
patient motion.

Lymphatic: No gross lymphadenopathy can be identified in the abdomen
or pelvis although right pelvic sidewall not well evaluated.

Reproductive: Obscured.

Other: No substantial intraperitoneal free fluid.

Musculoskeletal: Diffuse demineralization. Right hip replacement.
Old inferior right pubic ramus fracture. Status post lower lumbar
fusion. Multilevel thoracolumbar compression fractures.

Review of the MIP images confirms the above findings.
IMPRESSION: 1. Study is markedly degraded by patient motion throughout image
acquisition imaging through the pelvis further degraded by lumbar
fusion hardware and right hip replacement.
2. Within the above-stated limitation, no definite dissection is
identified in the thoraco abdominal aorta. There is no
thoracoabdominal aortic aneurysm.
3. Right groin hernia contains bowel but is largely obscured by
streak artifact and motion artifact. No findings to suggest bowel
obstruction at this time.
4. Diffuse bony demineralization with multiple thoracolumbar
compression fractures. These are age-indeterminate and cannot be
further evaluated given the marked motion degradation.

## 2018-12-08 ENCOUNTER — Telehealth: Payer: Self-pay

## 2018-12-08 NOTE — Telephone Encounter (Signed)
Spoke with [name], the patient's POA, and scheduled her for e-visit 5/20 for routine follow-up. Will request Dr. Carlyle Lipa records for the visit.   Consent obtained for video call.      Virtual Visit Pre-Appointment Phone Call  Confirm consent - "In the setting of the current Covid19 crisis, you are scheduled for a (phone or video) visit with your provider on (date) at (time).  Just as we do with many in-office visits, in order for you to participate in this visit, we must obtain consent.  If you'd like, I can send this to your mychart (if signed up) or email for you to review.  Otherwise, I can obtain your verbal consent now.  All virtual visits are billed to your insurance company just like a normal visit would be.  By agreeing to a virtual visit, we'd like you to understand that the technology does not allow for your provider to perform an examination, and thus may limit your provider's ability to fully assess your condition. If your provider identifies any concerns that need to be evaluated in person, we will make arrangements to do so.  Finally, though the technology is pretty good, we cannot assure that it will always work on either your or our end, and in the setting of a video visit, we may have to convert it to a phone-only visit.  In either situation, we cannot ensure that we have a secure connection.  Are you willing to proceed?" STAFF: Did the patient verbally acknowledge consent to telehealth visit? Document YES/NO here: YES  TELEPHONE CALL NOTE  [name] has been deemed a candidate for a follow-up tele-health visit to limit community exposure during the Covid-19 pandemic. I spoke with the patient via phone to ensure availability of phone/video source, confirm preferred email & phone number, and discuss instructions and expectations.  I reminded [name] to be prepared with any vital sign and/or heart rhythm information that could potentially be obtained via home monitoring, at the  time of her visit. I reminded [name] to expect a phone call prior to her visit.   IF USING DOXIMITY or [email or web address].ME - The patient will receive a link just prior to their visit by text.

## 2018-12-10 ENCOUNTER — Telehealth (INDEPENDENT_AMBULATORY_CARE_PROVIDER_SITE_OTHER): Payer: Medicare Other | Admitting: Cardiovascular Disease

## 2018-12-10 ENCOUNTER — Other Ambulatory Visit: Payer: Self-pay

## 2018-12-10 ENCOUNTER — Encounter: Payer: Self-pay | Admitting: Cardiovascular Disease

## 2018-12-10 VITALS — BP 118/80 | HR 76 | Ht <= 58 in | Wt 132.2 lb

## 2018-12-10 DIAGNOSIS — I5181 Takotsubo syndrome: Secondary | ICD-10-CM

## 2018-12-10 NOTE — Progress Notes (Signed)
Virtual Visit via Video Note   This visit type was conducted due to national recommendations for restrictions regarding the COVID-19 Pandemic (e.g. social distancing) in an effort to limit this patient's exposure and mitigate transmission in our community.  Due to her co-morbid illnesses, this patient is at least at moderate risk for complications without adequate follow up.  This format is felt to be most appropriate for this patient at this time.  All issues noted in this document were discussed and addressed.  A limited physical exam was performed with this format.  Please refer to the patient's chart for her consent to telehealth for [facility].   Date:  12/10/2018   ID:  [name], DOB 06/18/1935, MRN [id number]  Patient Location: Home Provider Location: Home  PCP:  Lajean Manes, MD  Cardiologist:  Sherren Mocha, MD  Electrophysiologist:  None   Evaluation Performed:  Follow-Up Visit  Chief Complaint:  Cardiomyopathy follow-up  History of Present Illness:    [name] is a 84 y.o. female with hx Takotsubo's Syndrome in 2007.  Cardiac catheterization at that time demonstrated minor nonobstructive coronary artery disease.  LV function normalized on follow-up echo studies, most recently assessed in 2018.  The patient has developed some cognitive issues over the last few years and generalized decline.  She is now widowed since 2017.  She is living with her daughter and son-in-law.  She is evaluated today via [employer] in light of the current COVID-19 pandemic.  She denies any significant shortness of breath, chest pain, leg swelling, heart palpitations, orthopnea, or PND.  The patient does not have symptoms concerning for COVID-19 infection (fever, chills, cough, or new shortness of breath).    Past Medical History:  Diagnosis Date  . Anemia   . Anxiety    confusion  . Blood transfusion    2005 maybe  . Coronary artery disease   . Depression   .  Diverticular disease   . Esophageal reflux   . Falls frequently   . Headache(784.0)    takes Prilosec  . Hypertension   . IBS (irritable bowel syndrome)   . Osteoarthrosis, unspecified whether generalized or localized, lower leg   . Other and unspecified hyperlipidemia   . Shortness of breath    when my heart is not doing right  . Takotsubo syndrome   . Wrist fracture    Past Surgical History:  Procedure Laterality Date  . BACK SURGERY    . BREAST RECONSTRUCTION    . CATARACT EXTRACTION W/ INTRAOCULAR LENS  IMPLANT, BILATERAL    . FRACTURE SURGERY    . HIP ARTHROPLASTY Right 06/10/2013   Procedure: ARTHROPLASTY MONOPOLAR HIP CEMENTED;  Surgeon: Marybelle Killings, MD;  Location: [facility];  Service: Orthopedics;  Laterality: Right;  . HYSTEROTOMY    . IMPLANTABLE CONTACT LENS IMPLANTATION    . JOINT REPLACEMENT    . KNEE SURGERY    . KYPHOPLASTY N/A 11/05/2013   Procedure: Jone Baseman TWO;  Surgeon: Ophelia Charter, MD;  Location: [place] [facility];  Service: Neurosurgery;  Laterality: N/A;  . OPEN REDUCTION INTERNAL FIXATION (ORIF) DISTAL RADIAL FRACTURE Right 11/14/2016   Procedure: OPEN REDUCTION INTERNAL FIXATION (ORIF) DISTAL RADIAL FRACTURE, with repair as indicated;  Surgeon: Iran Planas, MD;  Location: [facility];  Service: Orthopedics;  Laterality: Right;  . TONSILLECTOMY     as child     Current Meds  Medication Sig  . acetaminophen (TYLENOL) 500 MG tablet Take 1,000 mg by  mouth 2 (two) times a day.   Marland Kitchen atorvastatin (LIPITOR) 20 MG tablet Take 20 mg by mouth at bedtime.  . Cholecalciferol (VITAMIN D3) 125 MCG (5000 UT) CAPS Take 1 capsule by mouth daily.  Marland Kitchen CRANBERRY PO Take 2 capsules by mouth daily.  . divalproex (DEPAKOTE) 125 MG DR tablet Take 250 mg by mouth 2 (two) times daily.   Marland Kitchen loperamide (IMODIUM) 2 MG capsule Take 2 mg by mouth as needed for diarrhea or loose stools.  . mirtazapine (REMERON) 15 MG tablet Take 15 mg by mouth at bedtime.  Marland Kitchen omeprazole (PRILOSEC)  20 MG capsule Take 20 mg by mouth daily.    . potassium chloride SA (K-DUR,KLOR-CON) 20 MEQ tablet Take 20 mEq by mouth 2 (two) times daily.   . QUEtiapine (SEROQUEL) 50 MG tablet Take 50 mg by mouth 2 (two) times daily.   . sertraline (ZOLOFT) 50 MG tablet Take 50 mg by mouth daily.   . vitamin B-12 (CYANOCOBALAMIN) 1000 MCG tablet Take 1,000 mcg by mouth daily.     Allergies:   Adhesive [tape] and Sulfamethoxazole-trimethoprim   Social History   Tobacco Use  . Smoking status: Never Smoker  . Smokeless tobacco: Never Used  Substance Use Topics  . Alcohol use: No  . Drug use: No     Family Hx: The patient's family history includes Heart failure in her mother; Lung cancer in her brother and another family member; Stroke in her sister.  ROS:   Please see the history of present illness.    All other systems reviewed and are negative.   Prior CV studies:   The following studies were reviewed today:  Echo 11/10/2016: Study Conclusions  - Left ventricle: The cavity size was normal. There was mild focal   basal hypertrophy of the septum. Systolic function was normal.   The estimated ejection fraction was in the range of 60% to 65%.   Wall motion was normal; there were no regional wall motion   abnormalities. Doppler parameters are consistent with abnormal   left ventricular relaxation (grade 1 diastolic dysfunction). - Aortic valve: Mildly calcified annulus. Trileaflet; mildly   calcified leaflets. - Mitral valve: Calcified annulus. There was trivial regurgitation. - Right atrium: Central venous pressure (est): 3 mm Hg. - Tricuspid valve: There was mild regurgitation. - Pulmonary arteries: PA peak pressure: 25 mm Hg (S). - Pericardium, extracardiac: A small pericardial effusion was   identified anterior to the heart.  Impressions:  - Mild basal septal LV hypertrophy with LVEF 60-65% and grade 1   diastolic dysfunction. Mildly calcified mitral and aortic   annulus.  Trivial mitral and mild tricuspid regurgitation. Small   anterior pericardial effusion.  Labs/Other Tests and Data Reviewed:    EKG:  No ECG reviewed.  Recent Labs: 12/31/2017: BUN 14; Creatinine, Ser 0.84; Hemoglobin 9.7; Platelets 317; Potassium 4.5; Sodium 132   Recent Lipid Panel Lab Results  Component Value Date/Time   CHOL 167 02/15/2011 04:00 AM   TRIG 82 02/15/2011 04:00 AM   HDL 59 02/15/2011 04:00 AM   CHOLHDL 2.8 02/15/2011 04:00 AM   LDLCALC 92 02/15/2011 04:00 AM    Wt Readings from Last 3 Encounters:  12/10/18 132 lb 3.2 oz (60 kg)  12/31/17 116 lb 1.9 oz (52.7 kg)  12/11/16 105 lb 12.8 oz (48 kg)     Objective:    Vital Signs:  BP 118/80   Pulse 76   Ht 4\' 8"  (1.422 m)   Wt 132  lb 3.2 oz (60 kg)   BMI 29.64 kg/m    VITAL SIGNS:  reviewed  ASSESSMENT & PLAN:    1. Takotsubo cardiomyopathy: No recurrence.  Last assessment of LVEF from 2018 was normal.  Patient appears asymptomatic.  Continue observation in 1 year follow-up planned.  COVID-19 Education: The signs and symptoms of COVID-19 were discussed with the patient and how to seek care for testing (follow up with PCP or arrange E-visit). The importance of social distancing was discussed today.  Time:   Today, I have spent 16 minutes with the patient with telehealth technology discussing the above problems.     Medication Adjustments/Labs and Tests Ordered: Current medicines are reviewed at length with the patient today.  Concerns regarding medicines are outlined above.   Tests Ordered: No orders of the defined types were placed in this encounter.   Medication Changes: No orders of the defined types were placed in this encounter.   Disposition:  Follow up in 1 year(s)  Signed, Sherren Mocha, MD  12/10/2018 2:42 PM    [facility]

## 2019-03-11 ENCOUNTER — Encounter

## 2019-08-01 ENCOUNTER — Ambulatory Visit: Payer: Medicare Other | Attending: Internal Medicine

## 2019-08-01 DIAGNOSIS — Z23 Encounter for immunization: Secondary | ICD-10-CM

## 2019-08-01 NOTE — Progress Notes (Signed)
   Covid-19 Vaccination Clinic  Name:  [name]    MRN: [id number] DOB: 1935/04/30  08/01/2019  [name] was observed post Covid-19 immunization for 30 minutes based on pre-vaccination screening without incidence. She was provided with Vaccine Information Sheet and instruction to access the V-Safe system.   [name] was instructed to call 911 with any severe reactions post vaccine: Marland Kitchen Difficulty breathing  . Swelling of your face and throat  . A fast heartbeat  . A bad rash all over your body  . Dizziness and weakness    Immunizations Administered    Name Date Dose VIS Date Route   Pfizer COVID-19 Vaccine 08/01/2019  2:48 PM 0.3 mL 07/03/2019 Intramuscular   Manufacturer: [place]   Lot: [id number]   [place]: [id number]

## 2019-08-22 ENCOUNTER — Ambulatory Visit: Payer: Medicare PPO | Attending: Internal Medicine

## 2019-08-22 DIAGNOSIS — Z23 Encounter for immunization: Secondary | ICD-10-CM

## 2019-08-22 NOTE — Progress Notes (Signed)
   Covid-19 Vaccination Clinic  Name:  Koula Russo    MRN: [id number] DOB: 07/03/1935  08/22/2019  Ms. Nam was observed post Covid-19 immunization for 15 minutes without incidence. She was provided with Vaccine Information Sheet and instruction to access the V-Safe system.   Ms. Souffrant was instructed to call 911 with any severe reactions post vaccine: Marland Kitchen Difficulty breathing  . Swelling of your face and throat  . A fast heartbeat  . A bad rash all over your body  . Dizziness and weakness    Immunizations Administered    Name Date Dose VIS Date Route   Pfizer COVID-19 Vaccine 08/22/2019 12:45 PM 0.3 mL 07/03/2019 Intramuscular   Manufacturer: [place]   Lot: [id number]   [place]: [id number]

## 2020-04-22 DEATH — deceased
